# Patient Record
Sex: Male | Born: 1955 | Race: Black or African American | Hispanic: No | Marital: Single | Smoking: Former smoker
Health system: Southern US, Community
[De-identification: ages and names within clinical notes are randomized; demographics above are authoritative.]

## PROBLEM LIST (undated history)

## (undated) DIAGNOSIS — J45909 Unspecified asthma, uncomplicated: Secondary | ICD-10-CM

## (undated) DIAGNOSIS — Z950 Presence of cardiac pacemaker: Secondary | ICD-10-CM

## (undated) DIAGNOSIS — J449 Chronic obstructive pulmonary disease, unspecified: Secondary | ICD-10-CM

## (undated) DIAGNOSIS — I639 Cerebral infarction, unspecified: Secondary | ICD-10-CM

## (undated) DIAGNOSIS — M549 Dorsalgia, unspecified: Secondary | ICD-10-CM

## (undated) DIAGNOSIS — I10 Essential (primary) hypertension: Secondary | ICD-10-CM

## (undated) DIAGNOSIS — K219 Gastro-esophageal reflux disease without esophagitis: Secondary | ICD-10-CM

## (undated) DIAGNOSIS — R001 Bradycardia, unspecified: Secondary | ICD-10-CM

## (undated) DIAGNOSIS — K922 Gastrointestinal hemorrhage, unspecified: Secondary | ICD-10-CM

## (undated) DIAGNOSIS — N189 Chronic kidney disease, unspecified: Secondary | ICD-10-CM

## (undated) DIAGNOSIS — M519 Unspecified thoracic, thoracolumbar and lumbosacral intervertebral disc disorder: Secondary | ICD-10-CM

## (undated) DIAGNOSIS — I219 Acute myocardial infarction, unspecified: Secondary | ICD-10-CM

## (undated) DIAGNOSIS — G894 Chronic pain syndrome: Secondary | ICD-10-CM

## (undated) DIAGNOSIS — M1990 Unspecified osteoarthritis, unspecified site: Secondary | ICD-10-CM

## (undated) DIAGNOSIS — I495 Sick sinus syndrome: Secondary | ICD-10-CM

## (undated) DIAGNOSIS — J302 Other seasonal allergic rhinitis: Secondary | ICD-10-CM

## (undated) DIAGNOSIS — E785 Hyperlipidemia, unspecified: Secondary | ICD-10-CM

## (undated) HISTORY — DX: Bradycardia, unspecified: R00.1

## (undated) HISTORY — PX: BACK SURGERY: SHX140

## (undated) HISTORY — DX: Gastro-esophageal reflux disease without esophagitis: K21.9

## (undated) HISTORY — DX: Presence of cardiac pacemaker: Z95.0

## (undated) HISTORY — DX: Dorsalgia, unspecified: M54.9

## (undated) HISTORY — PX: PACEMAKER INSERTION: SHX728

## (undated) HISTORY — PX: LUMBAR LAMINECTOMY: SHX95

## (undated) HISTORY — DX: Acute myocardial infarction, unspecified: I21.9

## (undated) HISTORY — PX: HAND SURGERY: SHX662

## (undated) HISTORY — DX: Other seasonal allergic rhinitis: J30.2

## (undated) HISTORY — DX: Unspecified asthma, uncomplicated: J45.909

## (undated) HISTORY — DX: Unspecified osteoarthritis, unspecified site: M19.90

## (undated) HISTORY — DX: Chronic kidney disease, unspecified: N18.9

## (undated) HISTORY — PX: BUNIONECTOMY: SHX129

---

## 2004-06-10 DIAGNOSIS — M533 Sacrococcygeal disorders, not elsewhere classified: Secondary | ICD-10-CM | POA: Insufficient documentation

## 2004-06-10 DIAGNOSIS — M549 Dorsalgia, unspecified: Secondary | ICD-10-CM | POA: Insufficient documentation

## 2004-06-10 DIAGNOSIS — M129 Arthropathy, unspecified: Secondary | ICD-10-CM | POA: Insufficient documentation

## 2004-06-10 DIAGNOSIS — I10 Essential (primary) hypertension: Secondary | ICD-10-CM | POA: Insufficient documentation

## 2004-06-10 DIAGNOSIS — K2100 Gastro-esophageal reflux disease with esophagitis, without bleeding: Secondary | ICD-10-CM | POA: Insufficient documentation

## 2007-01-20 DIAGNOSIS — E785 Hyperlipidemia, unspecified: Secondary | ICD-10-CM | POA: Insufficient documentation

## 2007-10-08 DIAGNOSIS — F1410 Cocaine abuse, uncomplicated: Secondary | ICD-10-CM | POA: Insufficient documentation

## 2007-10-08 DIAGNOSIS — F528 Other sexual dysfunction not due to a substance or known physiological condition: Secondary | ICD-10-CM | POA: Insufficient documentation

## 2009-08-15 ENCOUNTER — Emergency Department: Admit: 2009-08-15 | Disposition: A | Discharge status: Home / Self Care | Payer: Self-pay | Source: Ambulatory Visit

## 2009-08-16 NOTE — ED Provider Notes (Signed)
VISIT NUMBER:  [id number]    DATE/TIME OF EVALUATION:  08/16/2009 at 0030    RESIDENT:  Dr. Joneen Roach.  I saw and evaluated the patient.  I agree with  the resident's/fellow's finding and plan of care as documented.  Details of  my evaluation are as follows:    HPI:  The patient is a 53 year old gentleman, who approximately 6 months  ago was fishing and got a dorsal spine of a fish in his finger.  He pulled  part of out but there was still a part in his finger.  Over the last  several days he has had increasing pain and swelling.  It does hurt to move  it particularly to flex the finger.  There is no numbness, weakness or  tingling.  No redness, swelling, purulent discharge of other places.  He  has been to 2 different emergency departments, who would no attempt to  remove the foreign body.    ALLERGIES:  Ibuprofen and all NSAIDs, as well as Dilaudid for which he a  rash.  He is able to take oral Percocet without any difficulty.    MEDICATIONS:     1. Hydrochlorothiazide.     2. Amlodipine.     3. Simvastatin.     4. Nexium.     5. Albuterol.    PAST MEDICAL / SURGICAL HISTORY:     1. Nocturnal bradycardia.     2. Hypertension.     3. Asthma.     4. Back surgery.     5. Left hand surgery.     6. He has a pacer.     7. Gastroesophageal reflux disease.     8. Hypercholesterolemia.     9. GI bleed.    FAMILY HISTORY:     1. Heart disease.     2. Hypertension.     3. Throat cancer.     4. Lung cancer.     5. Diabetes.    SOCIAL HISTORY:  He is a half-pack a day smoker for 40 years.  He does not  drink or take any recreational drugs.  He currently quit cocaine in 2004.  He works as a Naval architect.  He lives with significant other.  He denies  any domestic violence.    REVIEW OF SYSTEMS:  Cardiovascular:  Positive for heart disease and  hypertension.  Nocturnal bradycardia and pacemaker.  Pulmonary:  Positive  for asthma.  GI:  Positive for gastroesophageal reflux disease and GI  bleed.  Musculoskeletal/Skin:  Positive for  the foreign body in his finger,  as well as back surgery and left hand surgery.  All other systems reviewed  and were negative.    PHYSICAL EXAMINATION:  Temperature:  36.4.  Respiratory rate:  20.  Pulse:  80.  Blood pressure:  168/75.  Pulse oximetry:  98%.  Pain:  10.  General appearance:  The patient is awake, alert and oriented.  No obvious  distress.  Extremities:  His left long finger does have a palpable area of scar tissue  and very mild swelling.  There is no purulent drainage.  There is no  redness.  He has good profundus and superficialis use, as well as full  extension.  Neurovascular is intact.  Psychiatric:  Judgment, orientation, memory, mood and affect were all  intact.    MEDICAL DECISION MAKING/CONDITION:  Foreign body finger.        DDX:        PLAN:  DATA:        LABS:        RADIOLOGIC STUDIES:        ECG:        RHYTHM STRIPS:    PROCEDURE:     1. (I was present throughout the entire procedure):  Digital block  performed by Dr. Joneen Roach.  The finger was prepped with Chloraseptic and  approximately 4.5 to 4 cc of bupivacaine was injected into the base of the  finger with good results and resulting in anesthesia of the entire finger.       2. Foreign body removal performed by Dr. Joneen Roach and me.  I was there        for the entire procedure.  After cleaning the finger a #10 blade was        used in a transverse incision across the distal portion of the        foreign body.  Mosquito hemostats were used to dissect down through  the scar tissue until a clicking was felt. The foreign body was grasped and  the distal portion was removed.  The mosquito hemostats were reinserted and  the proximal portion was removed.  A total of approximately 2.5 cm of bone  was removed.  There is no other palpable foreign body.  Bleeding was well  controlled.  A dressing was placed.  CRITICAL CARE TIME (Time to perform separately billable procedures  subtracted from CC time):  CONSULT:  PCP NOTIFIED:  SMOKING  CESSATION COUNSELING:    FOLLOW-UP NOTE AND DISPOSITION:  The patient will be discharged with  Percocet and a dressing.  He will return if there are any problems.    ED DIAGNOSIS:   Fishbone foreign body in finger removed.                                        Electronically Signed and Finalized  by  Rochele Raring, MD 08/18/2009 14:13  ___________________________________________  Rochele Raring, MD      DD:   08/16/2009  DT:   08/16/2009  4:11 A  /[id number]  [id number]    cc:

## 2009-08-30 ENCOUNTER — Other Ambulatory Visit: Payer: Self-pay | Admitting: Gastroenterology

## 2009-08-30 ENCOUNTER — Observation Stay
Admit: 2009-08-30 | Disposition: A | Discharge status: Home / Self Care | Payer: Self-pay | Source: Ambulatory Visit | Attending: Emergency Medicine | Admitting: Emergency Medicine

## 2009-08-30 LAB — CBC AND DIFFERENTIAL
Baso # K/uL: 0 THOU/uL (ref 0.0–0.1)
Basophil %: 0.7 % (ref 0.2–1.2)
Eos # K/uL: 0.2 THOU/uL (ref 0.0–0.5)
Eosinophil %: 3.3 % (ref 0.8–7.0)
Hematocrit: 46 % (ref 40–51)
Hemoglobin: 15.9 g/dL (ref 13.7–17.5)
Lymph # K/uL: 1.8 THOU/uL (ref 1.3–3.6)
Lymphocyte %: 39.5 % (ref 21.8–53.1)
MCV: 88 fL (ref 79–92)
Mono # K/uL: 0.6 THOU/uL (ref 0.3–0.8)
Monocyte %: 13.7 % — ABNORMAL HIGH (ref 5.3–12.2)
Neut # K/uL: 1.9 THOU/uL (ref 1.8–5.4)
Platelets: 188 THOU/uL (ref 150–330)
RBC: 5.3 MIL/uL (ref 4.6–6.1)
RDW: 14.1 % (ref 11.6–14.4)
Seg Neut %: 42.8 % (ref 34.0–67.9)
WBC: 4.5 THOU/uL (ref 4.2–9.1)

## 2009-08-30 LAB — TROPONIN T
Troponin T: <0.01 ng/mL (ref 0.00–0.02)
Troponin T: <0.01 ng/mL (ref 0.00–0.02)

## 2009-08-30 LAB — CK ISOENZYMES
CK: 93 U/L (ref 46–171)
Mass CKMB: 2.7 ng/mL (ref 0.0–4.9)

## 2009-08-30 LAB — RUQ PANEL (ED ONLY)
ALT: 26 U/L (ref 0–50)
AST: 22 U/L (ref 0–50)
Albumin: 4.3 g/dL (ref 3.5–5.2)
Alk Phos: 107 U/L (ref 40–130)
Amylase: 39 U/L (ref 28–100)
Bilirubin,Direct: <0.2 mg/dL (ref 0.0–0.3)
Bilirubin,Total: 0.3 mg/dL (ref 0.0–1.2)
Lipase: 23 U/L (ref 13–60)
Total Protein: 6.5 g/dL (ref 6.3–7.7)

## 2009-08-30 LAB — PLASMA PROF 7 (ED ONLY)
Anion Gap,PL: 12 (ref 7–16)
CO2,Plasma: 22 mmol/L (ref 20–28)
Chloride,Plasma: 105 mmol/L (ref 96–108)
Creatinine: 1.45 mg/dL — ABNORMAL HIGH (ref 0.67–1.17)
GFR,Black: >59 *
GFR,Caucasian: 51 * — AB
Glucose,Plasma: 121 mg/dL — ABNORMAL HIGH (ref 74–106)
Potassium,Plasma: 3.6 mmol/L (ref 3.4–4.7)
Sodium,Plasma: 139 mmol/L (ref 132–146)
UN,Plasma: 15 mg/dL (ref 6–20)

## 2009-08-30 LAB — TYPE AND SCREEN
ABO RH Blood Type: O POS
Antibody Screen: NEGATIVE

## 2009-08-30 LAB — HOLD GRAY

## 2009-08-30 LAB — PROTIME-INR
INR: 0.8 — ABNORMAL LOW (ref 0.9–1.1)
Protime: 11.8 s — ABNORMAL LOW (ref 11.9–14.7)

## 2009-08-30 LAB — HEMOLYZED TEST FOR GREEN

## 2009-08-30 LAB — APTT: aPTT: 26.3 s (ref 22.3–35.3)

## 2009-08-30 NOTE — ED Provider Notes (Signed)
VISIT NUMBER:  [id number]    I saw and evaluated the patient.  I agree with the resident's/fellow's  finding and plan of care as documented.  Details of my evaluation are as  follows.    HPI:  This is a 54 year old gentleman with a history of pacemaker for sick  sinus syndrome who states that during the night his pacemaker fired twice  and he felt a shock in his left chest and pain in his left chest which went  into his left shoulder.  He does not have a defibrillator in, but he says  that he can feel when the pacemaker starts up.  He denies fever and chills.  No cough and no shortness of breath.  He states that he was admitted to  [facility] a month or two ago and had a stress test that was  negative.  That result is not available to [place].    ALLERGIES:  Alka Seltzer.  MEDICATIONS:  He has been noncompliant with his medications for the past  week because he cannot afford them.     1. Zocor.     2. Nexium.     3. Hydrochlorothiazide.     4. Norvasc.    PAST MEDICAL / SURGICAL HISTORY:  Hypertension, sick sinus syndrome,  pacemaker.    FAMILY HISTORY:  Family history of heart disease and CVA.    SOCIAL HISTORY:  He quit smoking today.  No alcohol or drug use.  He works  as a Naval architect.    REVIEW OF SYSTEMS:  General:  Blood pressure is high.  Cardiac:  Chest  pain.  All other systems reviewed and negative.    PHYSICAL EXAMINATION:  General:  On physical exam, he is awake and alert.  Eyes:  Conjunctivae pink.  HENT:  Normocephalic, atraumatic.  Heart:  Regular rate and rhythm. No murmur.  Pulmonary:  Lungs clear, no wheezes or rhonchi.  Abdomen:  Soft.  He has mild tenderness in his epigastric area.  No  guarding or rebound. Skin:  Normal color.  Extremities:  Well perfused atraumatic.  Neurologic:  Cranial nerves intact.  Motor and sensation intact.  Psychiatric:  Normal affect.    MEDICAL DECISION MAKING/CONDITION:  This is a patient with chest  discomfort, which seems unrelated to the pacemaker and I am  not sure if he  gets bradycardic and then feels palpitations as the pacemaker functions  appropriately or not.  We will get a CT of his chest, abdomen and pelvis to  rule out dissection and PE.  There were no abnormalities noted on any of  those studies and I think his likelihood for PE is low.  He has an ECG  which is a sinus rhythm, without ischemic changes. PLAN:  Our plan is to  admit him to observation unit for a second troponin.  EP was called to  check the functioning of his pacemaker and will be by to see him.      ED DIAGNOSIS:  Chest pain.                Electronically Signed and Finalized  by  Pauline Good, MD 09/01/2009 10:23  ___________________________________________  Pauline Good, MD      DD:   08/30/2009  DT:   08/30/2009  7:52 P  WUJ/[id number]#[id number]  [id number]    cc:

## 2009-08-31 LAB — CBC AND DIFFERENTIAL
Baso # K/uL: 0 THOU/uL (ref 0.0–0.1)
Basophil %: 0.2 % (ref 0.2–1.2)
Eos # K/uL: 0.2 THOU/uL (ref 0.0–0.5)
Eosinophil %: 4.5 % (ref 0.8–7.0)
Hematocrit: 44 % (ref 40–51)
Hemoglobin: 14.9 g/dL (ref 13.7–17.5)
Lymph # K/uL: 2.6 THOU/uL (ref 1.3–3.6)
Lymphocyte %: 49.4 % (ref 21.8–53.1)
MCV: 89 fL (ref 79–92)
Mono # K/uL: 0.5 THOU/uL (ref 0.3–0.8)
Monocyte %: 9.3 % (ref 5.3–12.2)
Neut # K/uL: 1.9 THOU/uL (ref 1.8–5.4)
Platelets: 165 THOU/uL (ref 150–330)
RBC: 4.9 MIL/uL (ref 4.6–6.1)
RDW: 14.1 % (ref 11.6–14.4)
Seg Neut %: 36.6 % (ref 34.0–67.9)
WBC: 5.2 THOU/uL (ref 4.2–9.1)

## 2009-08-31 LAB — HEPATIC FUNCTION PANEL
ALT: 25 U/L (ref 0–50)
AST: 25 U/L (ref 0–50)
Albumin: 4.1 g/dL (ref 3.5–5.2)
Alk Phos: 100 U/L (ref 40–130)
Bilirubin,Direct: <0.2 mg/dL (ref 0.0–0.3)
Bilirubin,Total: 0.2 mg/dL (ref 0.0–1.2)
Total Protein: 6.1 g/dL — ABNORMAL LOW (ref 6.3–7.7)

## 2009-08-31 LAB — AMYLASE: Amylase: 42 U/L (ref 28–100)

## 2009-08-31 LAB — BASIC METABOLIC PANEL
Anion Gap: 7 (ref 7–16)
CO2: 28 mmol/L (ref 20–28)
Calcium: 9 mg/dL (ref 8.6–10.2)
Chloride: 104 mmol/L (ref 96–108)
Creatinine: 1.66 mg/dL — ABNORMAL HIGH (ref 0.67–1.17)
GFR,Black: 53 * — AB
GFR,Caucasian: 44 * — AB
Glucose: 102 mg/dL (ref 74–106)
Lab: 17 mg/dL (ref 6–20)
Potassium: 4.4 mmol/L (ref 3.3–5.1)
Sodium: 139 mmol/L (ref 133–145)

## 2009-08-31 LAB — PHOSPHORUS: Phosphorus: 4 mg/dL (ref 2.7–4.5)

## 2009-08-31 LAB — LIPASE: Lipase: 19 U/L (ref 13–60)

## 2009-08-31 LAB — TROPONIN T: Troponin T: <0.01 ng/mL (ref 0.00–0.02)

## 2009-08-31 LAB — MAGNESIUM: Magnesium: 1.7 meq/L (ref 1.3–2.1)

## 2009-08-31 NOTE — ED Provider Notes (Signed)
FOLLOW-UP NOTE    VISIT NUMBER:  [id number]    DISCHARGE DATE FROM ED OBSERVATION:  08/31/2009.    PCP:  Shary Decamp, MD; however, the patient is telling me that he has an  appointment with a new doctor at [facility], but he does not know  the name of the doctor who he is going to see there.  The appointment is  set for January 17.    OBSERVATION STAY COURSE INCLUDES:  A 54 year old male with history of  chronic back pain on narcotics.  Evaluated in ED for multiple complaints  that include chest pain, abdominal pain, pain radiating to his shoulders.  He was subsequently placed in ED Obs, maintained on telemetry that did not  show any acute arrhythmia.  He was also ruled out with serial troponins.  All his troponins were less than 0.01.    Patient states that he had a recent negative stress test at [facility].    This morning, patient stated that his pain is in the abdominal area;  however, he is able to tolerate p.o. and has eaten a full breakfast.  Per  staff, patient has been asking for pain medications.  Patient states that  he has chronic back pain, and he is on narcotics and also on methadone.    PHYSICAL EXAM:  On exam, he looks comfortable in no acute distress.  Vital  signs are stable.  Anicteric sclerae.  Neck has no lymph nodes, no JVD.  Chest is clear to auscultation.  CVS is regular rate and rhythm.  Abdomen  is soft.  There is no area of localized tenderness.  Bowel sounds are plus.  Extremities without any edema.  CNS nonfocal.  Skin reveals some eczematous  patches.    DATA:        LABS:  CBC and SMA-7:  White count 5.2, hematocrit 44, platelets 165,  sodium 139, potassium 4.4, bicarb 28, BUN 17, creatinine mildly elevated at  1.66.  He has creatinine previously 1.40, 1.60, and 1.50.  Glucose is 102.  LFTs are within normal limits.  Amylase and lipase are normal.        RADIOLOGIC STUDIES:  Patient had a CT of his chest and also had a CT  of abdomen and pelvis.  Final impression  is:  1) No acute dissection.  2)  No evidence of any intraperitoneal abscess, diverticulitis, or inflammatory  changes of the bowel.  3) Normal appendix.  4) Multiple renal cysts some of  which are complex and may need follow-up with ultrasound.  Kidneys and  collecting system:  No hydronephrosis.  Nonspecific perinephric stranding.       Multiple renal cysts some of which are complex, for example on the right  in the mid-kidney.  Follow-up can be obtained with ultrasound.    ECG:  NSR, No acute changes    .      SMOKING CESSATION COUNSELING:  Three-plus minutes smoking cessation  counseling done.  Risks of smoking reviewed including possible lung disease  (COPD, cancer) and cardiac disease.  Options of cessation aids reviewed  including nicotine patch, hypnosis, and possible prescription medications.  Patient encouraged to follow up with outpatient M.D.    FINAL DIAGNOSIS:  Chest pain, abdominal pain, narcotic dependence, chronic  back pain.    Discharge instructions given through ED PulseCheck.  Patient is advised to  establish outpatient care and also with outpatient methadone maintenance  program.    FOLLOW  UP WITH:  PCP    MEDICATION CHANGES:  None.    PRESCRIPTIONS GIVEN:  Prescriptions have been given for Percocet 5/325  total number 20 and also for his methadone 10 mg total number 30.                                        Electronically Signed and Finalized  by  Nicholaus Bloom, MD 08/31/2009 20:10  ___________________________________________  Nicholaus Bloom, MD      DD:   08/31/2009  DT:   08/31/2009 11:59 A  [id number]  [id number]    cc:   Percell Locus, MD

## 2009-09-03 LAB — EKG 12-LEAD
P: 32 degrees
P: 41 degrees
P: 45 degrees
PR: 168 ms
PR: 164 ms
PR: 172 ms
QRS: 51 degrees
QRS: 61 degrees
QRS: 64 degrees
QRSD: 92 ms
QRSD: 94 ms
QRSD: 96 ms
QT: 396 ms
QT: 440 ms
QT: 428 ms
QTc: 449 ms
QTc: 465 ms
QTc: 421 ms
Rate: 77 {beats}/min
Rate: 67 {beats}/min
Rate: 58 {beats}/min
Severity: BORDERLINE
Severity: BORDERLINE
Severity: BORDERLINE
T: 47 degrees
T: 74 degrees
T: 77 degrees

## 2010-03-30 ENCOUNTER — Encounter: Payer: Self-pay | Admitting: Physician Assistant

## 2010-03-30 ENCOUNTER — Emergency Department
Admit: 2010-03-30 | Disposition: A | Discharge status: Home / Self Care | Payer: Self-pay | Source: Ambulatory Visit | Attending: Emergency Medicine | Admitting: Emergency Medicine

## 2010-03-30 HISTORY — DX: Chronic obstructive pulmonary disease, unspecified: J44.9

## 2010-03-30 HISTORY — DX: Essential (primary) hypertension: I10

## 2010-03-30 HISTORY — DX: Unspecified asthma, uncomplicated: J45.909

## 2010-03-30 HISTORY — DX: Gastrointestinal hemorrhage, unspecified: K92.2

## 2010-03-30 LAB — POCT URINALYSIS DIPSTICK
Glucose,UA: NORMAL
Ketones, UA: NEGATIVE
Leuk Esterase,UA: NEGATIVE
Lot #: 20402002
Nitrite,UA POCT: NEGATIVE
PH,Ur: 5

## 2010-03-30 MED ORDER — OXYCODONE-ACETAMINOPHEN 5-325 MG PO TABS *I*
1.0000 | ORAL_TABLET | ORAL | Status: AC | PRN
Start: 2010-03-30 — End: 2010-04-09

## 2010-03-30 MED ORDER — METHADONE HCL 10 MG PO TABS *I*
10.0000 mg | ORAL_TABLET | Freq: Four times a day (QID) | ORAL | Status: AC | PRN
Start: 2010-03-30 — End: 2010-04-09

## 2010-03-30 NOTE — Discharge Instructions (Signed)
Keep your appointment on 8/10 with your new doctor to discuss your chronic pain and blood in your urine.    They will review all your records from [facility] and [facility] to decide on future testing.

## 2010-03-30 NOTE — ED Notes (Signed)
Awake in bed, skin warm and dry, no acute distress noted. Requesting pain medication, T. Whilen PA notified, awaiting orders.

## 2010-03-30 NOTE — ED Notes (Signed)
States that he went to the ED three times - has had pain from his abdomen that radiates down into his groin, has been told that it is the way that he wears his belt - also expresses that his penis itches on the inside - started about a week ago with the abdominal pain - itching started yesterday, denies discharge and burning - denies any N/V

## 2010-03-30 NOTE — ED Provider Notes (Addendum)
History   Chief Complaint   Patient presents with   . Abdominal Pain   . Groin Pain   . Penis Pain       HPI Comments: 54 yr old presenting with vague abdominal pain radiating to groin area for greater than 6 months and currently out of his prescriptions for his percocet and Methadone.  No new symptoms and has been seen 4 times but told nothing of his test.  Pt. Has not had a MD in months- Dr. Otilio Miu family med left area and Dr. Derinda Late didn't get alone.  Notes in [facility] multiple documentation of chronic pain and narcotic seeking behavior.  Pt has an appointment with new MD on 04/08/10 but unsure where the MD is located.      Patient is a 54 y.o. male presenting with abdominal pain. The history is provided by the patient and a friend.   Abdominal Pain  The primary symptoms of the illness include abdominal pain. The primary symptoms of the illness do not include fever, fatigue, shortness of breath, nausea, vomiting, diarrhea, hematemesis, hematochezia or dysuria. Episode onset: greater than 6 months. The onset of the illness was gradual. The problem has not changed since onset.   The patient has not had a change in bowel habit. Additional symptoms associated with the illness include heartburn and back pain. Symptoms associated with the illness do not include chills, anorexia, diaphoresis, constipation, urgency, hematuria or frequency. Significant associated medical issues include PUD and GERD. Significant associated medical issues do not include inflammatory bowel disease, diabetes, sickle cell disease, gallstones, liver disease, substance abuse or diverticulitis.       Past Medical History   Diagnosis Date   . Hypertension    . COPD (chronic obstructive pulmonary disease)    . Asthma    . GI bleed          Past Surgical History   Procedure Date   . Back surgery      5 times since 1999   . Pacemaker insertion          Family History   Problem Relation Age of Onset   . Coronary art dis Father           reports that he has  been smoking cigarettes.  He has a 10 pack-year smoking history. He has never used smokeless tobacco.  He reports that he does not currently drink alcohol or use illicit drugs.    Review of Systems   Review of Systems   Constitutional: Negative for fever, chills, diaphoresis, activity change, appetite change and fatigue.   HENT: Negative for dental problem.    Eyes: Negative for pain.   Respiratory: Negative for cough, choking, chest tightness, shortness of breath and wheezing.    Cardiovascular: Negative for chest pain and palpitations.   Gastrointestinal: Positive for heartburn and abdominal pain. Negative for nausea, vomiting, diarrhea, constipation, blood in stool, hematochezia, abdominal distention, anal bleeding, anorexia and hematemesis.   Genitourinary: Positive for testicular pain. Negative for dysuria, urgency, frequency, hematuria, flank pain, decreased urine volume, discharge, penile swelling, scrotal swelling, difficulty urinating, genital sores and penile pain.        Dry rash to penis- previous MD used a cream with good effect.  >6 months abdominal to left scrotal pain.   Musculoskeletal: Positive for back pain. Negative for myalgias and joint swelling.   Skin: Negative for color change and pallor.   Neurological: Negative for dizziness and headaches.   Hematological: Negative for  adenopathy.   Psychiatric/Behavioral: Negative for agitation.       Physical Exam   BP 146/105  Pulse 81  Temp(Src) 36.6 C (97.9 F) (Tympanic)  Resp 20  Ht 1.854 m (6\' 1" )  Wt 147.873 kg (326 lb)  BMI 43.01 kg/m2  SpO2 95%    Physical Exam   Constitutional: He is oriented to person, place, and time. He appears well-developed and well-nourished. No distress.   HENT:   Head: Normocephalic.   Eyes: Conjunctivae are normal. Left eye exhibits no discharge. No scleral icterus.   Neck: Normal range of motion.   Cardiovascular: Regular rhythm.  Exam reveals no gallop and no friction rub.    No murmur  heard.  Pulmonary/Chest: Effort normal and breath sounds normal. No stridor.   Abdominal: Soft. Bowel sounds are normal. He exhibits no distension and no mass. There is no hepatosplenomegaly. Tenderness is present in the left lower quadrant. He has no rigidity, no rebound, no guarding, no CVA tenderness, no pain at McBurney's point and no Murphy's sign. No hernia. Hernia confirmed negative in the ventral area, confirmed negative in the right inguinal area and confirmed negative in the left inguinal area.               Soft, Non distended.  No hepatosplenomegaly. No tenderness with distraction (stethoscope) but tender to light touch.  Sits up and rolls over without any signs of distress.     Genitourinary: Rectum normal, prostate normal, testes normal and penis normal. Guaiac negative stool.        Right testis shows no mass, no swelling and no tenderness. Left testis shows no mass, no swelling and no tenderness. Circumcised. No phimosis, hypospadias, penile erythema or penile tenderness. No discharge found.        Dryness with some lichenification to dorsum of penis.  No ulcer or erythema.   Musculoskeletal: Normal range of motion. He exhibits no edema and no tenderness.   Lymphadenopathy:        Right: No inguinal adenopathy present.        Left: No inguinal adenopathy present.   Neurological: He is alert and oriented to person, place, and time.   Skin: Skin is warm and dry.   Psychiatric: He has a normal mood and affect. His behavior is normal.       Medical Decision Making   MDM  Number of Diagnoses or Management Options  Diagnosis management comments: Patient seen by me today, 03/30/2010 at 6:10PM    Assessment:  54 y.o., male comes to the ED with chronic pain and vague abdominal pain in setting of need of prescription refill.  Differential Diagnosis includes drug seeking behavior, nephrolithiasis, UTI, diverticulitis  Plan: UA trace heme.  Chronic condition which can be evaluated out pt setting.  Has had  extensive w/u at [facility] this year for similar complaints and no current signs of distress of fever.  Will refill scripts for Percocet and Methadone for 10 days but if fails to keep appointment will not refill again in emergency department.  Patient can use over the counter hydrocortisone cream for his dry area on penis.         Amount and/or Complexity of Data Reviewed  Clinical lab tests: ordered  Decide to obtain previous medical records or to obtain history from someone other than the patient: yes  Obtain history from someone other than the patient: yes  Review and summarize past medical records: yes  Patient agrees to plan of care and follow up with MD.  Cyndie Mull, PA

## 2012-05-09 ENCOUNTER — Encounter (HOSPITAL_COMMUNITY): Payer: Self-pay | Admitting: Family Medicine

## 2012-05-09 ENCOUNTER — Emergency Department (HOSPITAL_COMMUNITY)
Admission: EM | Admit: 2012-05-09 | Discharge: 2012-05-09 | Disposition: A | Discharge status: Home / Self Care | Payer: Medicare Other | Attending: Emergency Medicine | Admitting: Emergency Medicine

## 2012-05-09 DIAGNOSIS — Z950 Presence of cardiac pacemaker: Secondary | ICD-10-CM | POA: Insufficient documentation

## 2012-05-09 DIAGNOSIS — Z760 Encounter for issue of repeat prescription: Secondary | ICD-10-CM | POA: Insufficient documentation

## 2012-05-09 DIAGNOSIS — F17200 Nicotine dependence, unspecified, uncomplicated: Secondary | ICD-10-CM | POA: Insufficient documentation

## 2012-05-09 DIAGNOSIS — I10 Essential (primary) hypertension: Secondary | ICD-10-CM | POA: Insufficient documentation

## 2012-05-09 DIAGNOSIS — Z8673 Personal history of transient ischemic attack (TIA), and cerebral infarction without residual deficits: Secondary | ICD-10-CM | POA: Insufficient documentation

## 2012-05-09 HISTORY — DX: Cerebral infarction, unspecified: I63.9

## 2012-05-09 HISTORY — DX: Essential (primary) hypertension: I10

## 2012-05-09 MED ORDER — LISINOPRIL-HYDROCHLOROTHIAZIDE 10-12.5 MG PO TABS: 1.0000 | ORAL_TABLET | Freq: Every day | ORAL | Status: DC

## 2012-05-09 MED ORDER — AMLODIPINE BESYLATE 10 MG PO TABS: 5.0000 mg | ORAL_TABLET | Freq: Every day | ORAL | Status: DC

## 2012-05-09 MED ORDER — OXYCODONE-ACETAMINOPHEN 5-325 MG PO TABS: 1.0000 | ORAL_TABLET | ORAL | Status: AC | PRN

## 2012-05-09 MED ORDER — ESOMEPRAZOLE MAGNESIUM 40 MG PO CPDR: 40.0000 mg | DELAYED_RELEASE_CAPSULE | Freq: Every day | ORAL | Status: DC

## 2012-05-09 NOTE — ED Provider Notes (Signed)
History     CSN: [id number]  Arrival date & time 05/09/12  1907   First MD Initiated Contact with Patient 05/09/12 2028      Chief Complaint  Patient presents with  . Medication Refill    (Consider location/radiation/quality/duration/timing/severity/associated sxs/prior treatment) HPI History provided by pt.   [place].  Ran out of his blood pressure medications as well as nexium and percocet/methadone (taken for chronic low back pain) 4 days ago.  He denies headache, vision changes, CP, SOB.  No recent acid reflux.  No change in low back pain.    Past Medical History  Diagnosis Date  . Hypertension   . Stroke     Past Surgical History  Procedure Date  . Pacemaker insertion     History reviewed. No pertinent family history.  History  Substance Use Topics  . Smoking status: Current Everyday Smoker  . Smokeless tobacco: Not on file  . Alcohol Use: No      Review of Systems  All other systems reviewed and are negative.    Allergies  Review of patient's allergies indicates no known allergies.  Home Medications   Current Outpatient Rx  Name Route Sig Dispense Refill  . AMLODIPINE BESYLATE 2.5 MG PO TABS Oral Take 5 mg by mouth daily.    Marland Kitchen ESOMEPRAZOLE MAGNESIUM 20 MG PO CPDR Oral Take 20 mg by mouth daily before breakfast.    . METHADONE HCL 10 MG PO TABS Oral Take 10 mg by mouth every 8 (eight) hours. For pain    . OXYCODONE-ACETAMINOPHEN 5-325 MG PO TABS Oral Take 1 tablet by mouth every 4 (four) hours as needed. For pa in    . SIMVASTATIN 20 MG PO TABS Oral Take 20 mg by mouth every evening.    Marland Kitchen AMLODIPINE BESYLATE 10 MG PO TABS Oral Take 0.5 tablets (5 mg total) by mouth daily. 30 tablet 0  . ESOMEPRAZOLE MAGNESIUM 40 MG PO CPDR Oral Take 1 capsule (40 mg total) by mouth daily. 30 capsule 0  . LISINOPRIL-HYDROCHLOROTHIAZIDE 10-12.5 MG PO TABS Oral Take 1 tablet by mouth daily. 30 tablet 0  . OXYCODONE-ACETAMINOPHEN 5-325 MG PO TABS  Oral Take 1 tablet by mouth every 4 (four) hours as needed for pain. 20 tablet 0    BP 166/111  Pulse 79  Temp 98.2 F (36.8 C) (Oral)  Resp 16  SpO2 96%  Physical Exam  Nursing note and vitals reviewed. Constitutional: He is oriented to person, place, and time. He appears well-developed and well-nourished. No distress.  HENT:  Head: Normocephalic and atraumatic.  Eyes:       Normal appearance  Neck: Normal range of motion.  Cardiovascular: Normal rate and regular rhythm.   Pulmonary/Chest: Effort normal and breath sounds normal. No respiratory distress.  Musculoskeletal: Normal range of motion.       Multiple vertical surgical scars to lumbar spine.  Full active ROM all four extremities.  Ambulates w/out difficulty.   Neurological: He is alert and oriented to person, place, and time.  Skin: Skin is warm and dry. No rash noted.  Psychiatric: He has a normal mood and affect. His behavior is normal.    ED Course  Procedures (including critical care time)  Labs Reviewed - No data to display No results found.   1. Medication refill       MDM  Pt presents for medication refill.  Visiting from out of town.  Out of amlodipine, lisinopril,  nexium and percocet/methadone (taken for chronic low back pain) for 4 days.  Pt asymptomatic w/ exception of chronic/unchanged low back pain.  BP elevated at 166/111 but exam otherwise unremarkable.  Pt seems very reasonable.  D/c'd home w/ refill of percocet, nexium and BP medications.  Return precautions discussed.         Dennis Brock [place], [place] 05/09/12 2221

## 2012-05-09 NOTE — ED Notes (Signed)
Pt is out of high blood pressure medication. sts x 4 days. Pt here from [place]. Sts takes lisinopril and Amlodipine.

## 2012-05-09 NOTE — ED Notes (Signed)
Prescriptions x4 given with discharge instructions.

## 2012-05-09 NOTE — ED Notes (Signed)
Pt. Reports just moved here from [place]. States had a stroke, has a pacemaker, and hx of back surgery and has run out of medications.

## 2012-05-10 NOTE — ED Provider Notes (Signed)
Medical screening examination/treatment/procedure(s) were performed by non-physician practitioner and as supervising physician I was immediately available for consultation/collaboration.   Ollen Rao, MD 05/10/12 2356 

## 2012-05-31 ENCOUNTER — Emergency Department (HOSPITAL_COMMUNITY): Payer: Medicare Other

## 2012-05-31 ENCOUNTER — Encounter (HOSPITAL_COMMUNITY): Payer: Self-pay | Admitting: *Deleted

## 2012-05-31 ENCOUNTER — Inpatient Hospital Stay (HOSPITAL_COMMUNITY)
Admission: EM | Admit: 2012-05-31 | Discharge: 2012-06-02 | DRG: 065 | Disposition: A | Discharge status: Home / Self Care | Payer: Medicare Other | Attending: Family Medicine | Admitting: Family Medicine

## 2012-05-31 DIAGNOSIS — I509 Heart failure, unspecified: Secondary | ICD-10-CM | POA: Diagnosis not present

## 2012-05-31 DIAGNOSIS — I6990 Unspecified sequelae of unspecified cerebrovascular disease: Secondary | ICD-10-CM

## 2012-05-31 DIAGNOSIS — G8190 Hemiplegia, unspecified affecting unspecified side: Secondary | ICD-10-CM | POA: Diagnosis present

## 2012-05-31 DIAGNOSIS — I639 Cerebral infarction, unspecified: Secondary | ICD-10-CM

## 2012-05-31 DIAGNOSIS — I10 Essential (primary) hypertension: Secondary | ICD-10-CM | POA: Diagnosis present

## 2012-05-31 DIAGNOSIS — G894 Chronic pain syndrome: Secondary | ICD-10-CM | POA: Diagnosis present

## 2012-05-31 DIAGNOSIS — K219 Gastro-esophageal reflux disease without esophagitis: Secondary | ICD-10-CM | POA: Insufficient documentation

## 2012-05-31 DIAGNOSIS — Z720 Tobacco use: Secondary | ICD-10-CM | POA: Diagnosis present

## 2012-05-31 DIAGNOSIS — I517 Cardiomegaly: Secondary | ICD-10-CM

## 2012-05-31 DIAGNOSIS — I671 Cerebral aneurysm, nonruptured: Secondary | ICD-10-CM | POA: Diagnosis present

## 2012-05-31 DIAGNOSIS — Z79899 Other long term (current) drug therapy: Secondary | ICD-10-CM

## 2012-05-31 DIAGNOSIS — G8929 Other chronic pain: Secondary | ICD-10-CM | POA: Diagnosis present

## 2012-05-31 DIAGNOSIS — I5030 Unspecified diastolic (congestive) heart failure: Secondary | ICD-10-CM | POA: Diagnosis not present

## 2012-05-31 DIAGNOSIS — I119 Hypertensive heart disease without heart failure: Secondary | ICD-10-CM | POA: Diagnosis present

## 2012-05-31 DIAGNOSIS — Z23 Encounter for immunization: Secondary | ICD-10-CM

## 2012-05-31 DIAGNOSIS — I495 Sick sinus syndrome: Secondary | ICD-10-CM | POA: Diagnosis present

## 2012-05-31 DIAGNOSIS — Z8673 Personal history of transient ischemic attack (TIA), and cerebral infarction without residual deficits: Secondary | ICD-10-CM | POA: Diagnosis present

## 2012-05-31 DIAGNOSIS — I6789 Other cerebrovascular disease: Secondary | ICD-10-CM | POA: Diagnosis present

## 2012-05-31 DIAGNOSIS — I6350 Cerebral infarction due to unspecified occlusion or stenosis of unspecified cerebral artery: Principal | ICD-10-CM | POA: Diagnosis present

## 2012-05-31 DIAGNOSIS — F17200 Nicotine dependence, unspecified, uncomplicated: Secondary | ICD-10-CM | POA: Diagnosis present

## 2012-05-31 DIAGNOSIS — E785 Hyperlipidemia, unspecified: Secondary | ICD-10-CM | POA: Diagnosis present

## 2012-05-31 DIAGNOSIS — Z950 Presence of cardiac pacemaker: Secondary | ICD-10-CM

## 2012-05-31 HISTORY — DX: Hyperlipidemia, unspecified: E78.5

## 2012-05-31 HISTORY — DX: Chronic pain syndrome: G89.4

## 2012-05-31 HISTORY — DX: Sick sinus syndrome: I49.5

## 2012-05-31 HISTORY — DX: Gastro-esophageal reflux disease without esophagitis: K21.9

## 2012-05-31 LAB — CBC
HCT: 43.3 % (ref 39.0–52.0)
MCV: 85.8 fL (ref 78.0–100.0)
Platelets: 189 10*3/uL (ref 150–400)
Platelets: 184 10*3/uL (ref 150–400)
RBC: 5.01 MIL/uL (ref 4.22–5.81)
RDW: 13.6 % (ref 11.5–15.5)
RDW: 13.8 % (ref 11.5–15.5)
WBC: 5.1 10*3/uL (ref 4.0–10.5)
WBC: 5.2 10*3/uL (ref 4.0–10.5)

## 2012-05-31 LAB — PROTIME-INR: INR: 0.86 (ref 0.00–1.49)

## 2012-05-31 LAB — BASIC METABOLIC PANEL
Chloride: 100 mEq/L (ref 96–112)
Creatinine, Ser: 1.28 mg/dL (ref 0.50–1.35)
GFR calc Af Amer: 71 mL/min — ABNORMAL LOW (ref >90)
Sodium: 137 mEq/L (ref 135–145)

## 2012-05-31 LAB — CREATININE, SERUM
GFR calc Af Amer: 70 mL/min — ABNORMAL LOW (ref >90)
GFR calc non Af Amer: 60 mL/min — ABNORMAL LOW (ref >90)

## 2012-05-31 LAB — TROPONIN I: Troponin I: <0.3 ng/mL (ref <0.30)

## 2012-05-31 MED ORDER — IOHEXOL 350 MG/ML SOLN
75.0000 mL | Freq: Once | INTRAVENOUS | Status: AC | PRN
  Administered 2012-05-31: 75 mL via INTRAVENOUS

## 2012-05-31 MED ORDER — FAMOTIDINE 20 MG PO TABS
20.0000 mg | ORAL_TABLET | Freq: Two times a day (BID) | ORAL | Status: DC
  Filled 2012-05-31: qty 1

## 2012-05-31 MED ORDER — MORPHINE SULFATE 2 MG/ML IJ SOLN: 2.0000 mg | INTRAMUSCULAR | Status: DC | PRN

## 2012-05-31 MED ORDER — INFLUENZA VIRUS VACC SPLIT PF IM SUSP
0.5000 mL | INTRAMUSCULAR | Status: AC
Start: 2012-06-01 — End: 2012-06-02
  Filled 2012-05-31: qty 0.5

## 2012-05-31 MED ORDER — ASPIRIN 81 MG PO CHEW
324.0000 mg | CHEWABLE_TABLET | Freq: Once | ORAL | Status: AC
  Administered 2012-05-31: 324 mg via ORAL
  Filled 2012-05-31: qty 4

## 2012-05-31 MED ORDER — HEPARIN SODIUM (PORCINE) 5000 UNIT/ML IJ SOLN
5000.0000 [IU] | Freq: Three times a day (TID) | INTRAMUSCULAR | Status: DC
  Administered 2012-05-31: 5000 [IU] via SUBCUTANEOUS
  Filled 2012-05-31 (×2): qty 1

## 2012-05-31 MED ORDER — GABAPENTIN 300 MG PO CAPS
300.0000 mg | ORAL_CAPSULE | Freq: Four times a day (QID) | ORAL | Status: DC | PRN
  Administered 2012-05-31: 300 mg via ORAL
  Filled 2012-05-31 (×2): qty 1

## 2012-05-31 MED ORDER — PNEUMOCOCCAL VAC POLYVALENT 25 MCG/0.5ML IJ INJ
0.5000 mL | INJECTION | INTRAMUSCULAR | Status: AC
  Filled 2012-05-31: qty 0.5

## 2012-05-31 MED ORDER — SIMVASTATIN 20 MG PO TABS
20.0000 mg | ORAL_TABLET | Freq: Every evening | ORAL | Status: DC
  Administered 2012-06-01: 20 mg via ORAL
  Filled 2012-05-31 (×2): qty 1

## 2012-05-31 MED ORDER — NICOTINE 7 MG/24HR TD PT24
7.0000 mg | MEDICATED_PATCH | Freq: Every day | TRANSDERMAL | Status: DC
  Administered 2012-06-01: 7 mg via TRANSDERMAL
  Filled 2012-05-31: qty 1

## 2012-05-31 MED ORDER — PANTOPRAZOLE SODIUM 40 MG PO TBEC
40.0000 mg | DELAYED_RELEASE_TABLET | Freq: Every day | ORAL | Status: DC
  Administered 2012-05-31 – 2012-06-02 (×3): 40 mg via ORAL
  Filled 2012-05-31 (×3): qty 1

## 2012-05-31 MED ORDER — ONDANSETRON HCL 4 MG/2ML IJ SOLN: 4.0000 mg | Freq: Four times a day (QID) | INTRAMUSCULAR | Status: DC | PRN

## 2012-05-31 MED ORDER — MORPHINE SULFATE 2 MG/ML IJ SOLN
1.0000 mg | INTRAMUSCULAR | Status: DC | PRN
  Administered 2012-06-01 (×3): 1 mg via INTRAVENOUS
  Filled 2012-05-31 (×3): qty 1

## 2012-05-31 MED ORDER — MORPHINE SULFATE 2 MG/ML IJ SOLN
1.0000 mg | INTRAMUSCULAR | Status: DC | PRN
  Administered 2012-05-31 (×2): 1 mg via INTRAVENOUS
  Filled 2012-05-31: qty 1

## 2012-05-31 MED ORDER — CLOPIDOGREL BISULFATE 75 MG PO TABS
75.0000 mg | ORAL_TABLET | Freq: Every day | ORAL | Status: DC
  Administered 2012-06-01 – 2012-06-02 (×2): 75 mg via ORAL
  Filled 2012-05-31 (×2): qty 1

## 2012-05-31 MED ORDER — SENNOSIDES-DOCUSATE SODIUM 8.6-50 MG PO TABS
1.0000 | ORAL_TABLET | Freq: Every evening | ORAL | Status: DC | PRN
  Filled 2012-05-31: qty 1

## 2012-05-31 NOTE — H&P (Signed)
Family Medicine Teaching [facility] Admission History and Physical Service Pager: [phone]  Patient name: Dennis Brock [facility] Medical record number: [id number] Date of birth: 20-Apr-1956 Age: 56 y.o. Gender: male  Primary Care Provider: Unassigned  Chief Complaint: Left sided weakness  Assessment and Plan:  Dennis Brock is a 56 y.o. year old male  with a history tobacco abuse, hypertension, hyperlipidemia, and of stroke on Aspirin presenting with worsening left sided weakness likely due to recurrent stroke  1. Acute Stroke-aspirin failure, will advance to plavix. Cannot do MRI due to pacemaker but assume given presentation.  1. Will await further neuro recs 1. Specifically if any recommendations for possible developing aneurysm.  2. Risk stratify with a1c and fasting lipid 3. Risk factor modification-will monitor #1 as well as have stressed that patient MUST quit smoking 4. Echocardiogram, carotid dopplers ordered 5. Neuro floor with neuro checks 6. PT/OT/speech consulted 7. Treat headache with morphine for now and transition to PO in AM 8. Hold home antihypertensives for 24 hours to allow permissive hypertension then slowly reintroduce if patient in fact taking 2. Chest Pain-monitor overnight on telemetry. Wells criteria 0. TIMI risk score of 2. Greatly improved and reportedly gone when seen by neurology. Will monitor overnight. Noncardiac in nature (1/3 as relieved by rest, but not provoked by exertion or anginal in quality). Cycle cardiac enzymes and repeat AM EKG. TSH pending in addition to stroke risk stratification. Will likely need outpatient stress test but will defer unless chest pain continues in AM. Possible GI in nature given GERD-continue PPI and consider GI cocktail.  3. Sick Sinus syndrome-pacemaker in place, monitor on telemetry 4. Chronic Pain-reportedly on methadone and percocet in [place]. Will treat with morphine acutely for HA but will likely need to transition to PO  pain meds together. Do not intend to chronically prescribe for patient, likely will need pain medicine clinic.  5. HTN-hold meds per #1 6. HLD-continue zocor, check lipids 7. Tobacco abuse-continue to stress importance of quitting. Nicotine patch.  8. FEN/GI: Heart healthy diet as passed bedside swallow screen 9. Prophylaxis: SCDs 10. Disposition: pending further workup  11. Code Status: Full Code  History of Present Illness: Dennis Brock is a 56 y.o. year old male presenting with a history of stroke 2 months ago on Aspirin presenting with worsening left sided weakness.   Patient got up to use bathroom 2 am last night and noted head hurt very bad behind right ear and over left eye. He felt in his normal state of health when he went to bed at 10 or 11.  Patient got dizzy, had double vision during this time.Also noted worsening left leg and arm weakness as well as worsening numbness in left arm. Patient states at baseline has weakness on left side including the fact that he drags that foot at times when walking. Patient's had hurt so bad couldn't sleep. Head started hurting worse in late morning so he came to hospital at around 1pm. Nausea and vomiting when pain got bad up to 10/10 felt like beating with stick. ROS also shows sensitivity to light in left eye. Pain is currently just in front of left ear. Patient states symptoms eerily similar to his stroke 2 months ago. Patient very adamant that he was on aspirin before going to hospital but that he was sent home on aspirin.   Chest pain also at beginning of this episode after numbness started in arm. Stabbing pain on left side 8/10 radiating to shoulder with  gradual onset over time. Cold sweats on multiple occaisons. Fluttering feeling in heart. Slight SOB when walking around but none at rest. Main symptom was no energy.   Got morphine and HA improved to 4/10. Chest pain improved to 2/10. CT confirms history of stroke with remote infarcts in both  the right basal ganglia and left thalamic areas. Neuro was consulted in ED and initiated workup with CT MRA of head and neck which were largely unremarkable except for possible developing aneurysm in the anterior communicating artery. Asked for patient to be admitted to medical team.   Patient Active Problem List  Diagnosis  . Hypertension  . Stroke  . Hyperlipidemia  . Tobacco abuse  . Sick sinus syndrome  . GERD (gastroesophageal reflux disease)  . Chronic pain syndrome   Past Medical History: Past Medical History  Diagnosis Date  . Hypertension     amlodipine, lisinopril  . Stroke     was on aspirin before stroke. July 2012 in [place].  sent home aspirin.   Marland Kitchen Hyperlipidemia     zocor 40mg   . Tobacco abuse     14-15 pack years at 2 packs per week  . Sick sinus syndrome     s/p pacemaker, no defibrillator  . GERD (gastroesophageal reflux disease)   . Chronic pain syndrome     on methadone and percocet   Past Surgical History: Past Surgical History  Procedure Date  . Pacemaker insertion   . Back surgery     5 back surgeries  . Hand surgery     3 broken bones  . Bunionectomy     right foot   Social History: History  Substance Use Topics  . Smoking status: Current Every Day Smoker  . Smokeless tobacco: Not on file  . Alcohol Use: No   For any additional social history documentation, please refer to relevant sections of EMR.  Family History: Family History  Problem Relation Age of Onset  . Stroke      grandfather and uncle died of stroke  . Heart attack      uncle  . Cancer Father     throat  . Transient ischemic attack Mother     61 and doing well   Allergies: No Known Allergies No current facility-administered medications on file prior to encounter.   Current Outpatient Prescriptions on File Prior to Encounter  Medication Sig Dispense Refill  . lisinopril-hydrochlorothiazide (PRINZIDE,ZESTORETIC) 10-12.5 MG per tablet Take 1 tablet by mouth daily.  30  tablet  0  . ranitidine (ZANTAC) 150 MG tablet Take 150 mg by mouth daily.      . simvastatin (ZOCOR) 20 MG tablet Take 20 mg by mouth every evening.       Review Of Systems: Per HPI  Otherwise 12 point review of systems was performed and was unremarkable.  Physical Exam: BP 136/94  Pulse 68  Temp 97.8 F (36.6 C) (Oral)  Resp 20  SpO2 94% Gen: NAD, resting comfortably in bed, laughing, cheerful HEENT: NCAT, MMM, PERRLA on my exam (previously noted L>R pupil size), severe photophobia noted with left eye CV: RRR no mrg  Lungs: CTAB  Abd: soft/nontender/nondistended/normal bowel sounds  MSK: moves all extremities, no edema  Skin: warm and dry, no rash  Neuro: alert and oriented x3. CN II-XII intatc with exception of following deficits: slight ptosis of right eye,very slight asymetry on smile with left side limited compared to right.  Sensation normal on right side with fine touch decreased  on left. reflexes normal symmetric throughout, 5/5 muscle strength in upper and lower extremities on right with 4/5 strength throughout left. Normal finger to nose on my exam.      Labs and Imaging: Results for orders placed during the hospital encounter of 05/31/12 (from the past 24 hour(s))  GLUCOSE, CAPILLARY     Status: Normal   Collection Time   05/31/12  2:29 PM      Component Value Range   Glucose-Capillary 97  70 - 99 mg/dL  CBC     Status: Normal   Collection Time   05/31/12  2:30 PM      Component Value Range   WBC 5.1  4.0 - 10.5 K/uL   RBC 5.00  4.22 - 5.81 MIL/uL   Hemoglobin 15.3  13.0 - 17.0 g/dL   HCT 16.1  09.6 - 04.5 %   MCV 85.8  78.0 - 100.0 fL   MCH 30.6  26.0 - 34.0 pg   MCHC 35.7  30.0 - 36.0 g/dL   RDW 40.9  81.1 - 91.4 %   Platelets 189  150 - 400 K/uL  BASIC METABOLIC PANEL     Status: Abnormal   Collection Time   05/31/12  2:30 PM      Component Value Range   Sodium 137  135 - 145 mEq/L   Potassium 3.9  3.5 - 5.1 mEq/L   Chloride 100  96 - 112 mEq/L   CO2  28  19 - 32 mEq/L   Glucose, Bld 106 (*) 70 - 99 mg/dL   BUN 12  6 - 23 mg/dL   Creatinine, Ser 7.82  0.50 - 1.35 mg/dL   Calcium 9.9  8.4 - 95.6 mg/dL   GFR calc non Af Amer 61 (*) >90 mL/min   GFR calc Af Amer 71 (*) >90 mL/min  TROPONIN I     Status: Normal   Collection Time   05/31/12  2:30 PM      Component Value Range   Troponin I <0.30  <0.30 ng/mL  PROTIME-INR     Status: Normal   Collection Time   05/31/12  2:30 PM      Component Value Range   Prothrombin Time 11.7  11.6 - 15.2 seconds   INR 0.86  0.00 - 1.49  APTT     Status: Normal   Collection Time   05/31/12  2:30 PM      Component Value Range   aPTT 28  24 - 37 seconds  CBC     Status: Normal   Collection Time   05/31/12 10:34 PM      Component Value Range   WBC 5.2  4.0 - 10.5 K/uL   RBC 5.01  4.22 - 5.81 MIL/uL   Hemoglobin 15.1  13.0 - 17.0 g/dL   HCT 21.3  08.6 - 57.8 %   MCV 86.4  78.0 - 100.0 fL   MCH 30.1  26.0 - 34.0 pg   MCHC 34.9  30.0 - 36.0 g/dL   RDW 46.9  62.9 - 52.8 %   Platelets 184  150 - 400 K/uL  CREATININE, SERUM     Status: Abnormal   Collection Time   05/31/12 10:34 PM      Component Value Range   Creatinine, Ser 1.30  0.50 - 1.35 mg/dL   GFR calc non Af Amer 60 (*) >90 mL/min   GFR calc Af Amer 70 (*) >90 mL/min  TROPONIN I  Status: Normal   Collection Time   05/31/12 10:34 PM      Component Value Range   Troponin I <0.30  <0.30 ng/mL    Ct Head Wo Contrast 05/31/2012  IMPRESSION: No evidence of acute intracranial abnormality.  Nonspecific white matter hypodensities- may represent chronic small vessel white matter ischemic changes.  Remote right basal ganglia and left thalamic infarcts.     Ct Angio Head W/cm &/or Wo Cm 05/31/2012 IMPRESSION: 1.  No intracranial stenosis or major circle Willis branch occlusion. 2.  Ectatic anterior communicating artery.  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 Imaging surveillance of this finding  is suggested, such as biannual intracranial CTA (which could then be discontinued or spaced further if this finding is found to be stable over several years). 3.  Stable CT appearance of the brain; chronic small vessel ischemia.   Original Report Authenticated By: Harley Hallmark, M.D.    Ct Angio Neck W/cm &/or Wo/cm 05/31/2012    IMPRESSION:  1.  Negative neck CTA.    Dg Chest 2 View 05/31/2012   No acute infiltrate or pulmonary edema.     Tana Conch, MD, PGY2 06/01/2012 12:42 AM

## 2012-05-31 NOTE — ED Notes (Signed)
Pt is here with dizziness with left eye pain for 3 days.  Headache to left head today, pt reports chest pain, sob, and nausea that started today.

## 2012-05-31 NOTE — ED Notes (Signed)
MD at bedside. Neuro consult

## 2012-05-31 NOTE — ED Notes (Signed)
LEft arm weakness---Hx of strokes in past with tia and severe headache with dizziness

## 2012-05-31 NOTE — ED Notes (Signed)
Patient transported to CT 

## 2012-05-31 NOTE — Progress Notes (Signed)
Referring Physician:  Dr. Ranae Palms    Chief Complaint: headache, left sided weakness  HPI: Dennis Brock is an 56 y.o. male who LKN 2100 yesterday 05/30/12, woke up to urinate 0200 today and had a headache. Went back to sleep and awoke again with headache which worsened and at 1300 he began having dizziness "room spinning", he has sensitivity to light in his right eye. He later states that his left sided weakness started 2 days ago. He states that this is just like the sx that he experienced 2 mos ago when he was admitted with a stroke at an outlying hospital. No MRI due to PPM. He also stated that he was having some chest pain earlier today with shortness of breath which has now cleared.  Current headache location: left temporal "sharpe", right posterior auricular. He is wanting medication for pain control.  2 mos ago when he was admitted for stroke he stated that he had same sx, was admitted, had generalized weakness which resolved and he had no residual problems. He was told that he had experienced 2 strokes. He was sent home at that time on a baby aspirin. CT today confirms remote right basal ganglia and left thalamic infarcts.  LSN: 2100 on 05/30/2012 (of headache), left sided weakness "2 days ago" tPA Given: No: out of window, reported recent stroke 2 mos ago at outlying facility  Past Medical History  Diagnosis Date  . Hypertension   . Stroke     Past Surgical History  Procedure Date  . Pacemaker insertion     No family history on file. Social History:  reports that he has been smoking.  He does not have any smokeless tobacco history on file. He reports that he does not drink alcohol or use illicit drugs.  Allergies: No Known Allergies No current facility-administered medications for this encounter.   Current Outpatient Prescriptions  Medication Sig Dispense Refill  . amLODipine (NORVASC) 10 MG tablet Take 10 mg by mouth daily.      Marland Kitchen aspirin 81 MG chewable tablet Chew 81  mg by mouth daily.      Marland Kitchen lisinopril-hydrochlorothiazide (PRINZIDE,ZESTORETIC) 10-12.5 MG per tablet Take 1 tablet by mouth daily.  30 tablet  0  . naproxen sodium (ANAPROX) 220 MG tablet Take 220 mg by mouth 2 (two) times daily as needed. As needed for pain.      . ranitidine (ZANTAC) 150 MG tablet Take 150 mg by mouth daily.      . simvastatin (ZOCOR) 20 MG tablet Take 20 mg by mouth every evening.        ROS: History obtained from the patient  General ROS: negative for - chills, fatigue, fever, night sweats, weight gain or weight loss Psychological ROS: negative for - behavioral disorder, hallucinations, memory difficulties, mood swings or suicidal ideation Ophthalmic ROS: negative for - blurry vision,  eye pain or loss of vision, +double vision, ENT ROS: negative for - epistaxis, nasal discharge, oral lesions, sore throat, tinnitus or vertigo Allergy and Immunology ROS: negative for - hives or itchy/watery eyes Hematological and Lymphatic ROS: negative for - bleeding problems, bruising or swollen lymph nodes Endocrine ROS: negative for - galactorrhea, hair pattern changes, polydipsia/polyuria or temperature intolerance Respiratory ROS: negative for - cough, hemoptysis, shortness of breath or wheezing Cardiovascular ROS: negative for edema or irregular heartbeat, +- chest pain, shortness of breath Gastrointestinal ROS: negative for - abdominal pain, diarrhea, hematemesis, nausea/vomiting or stool incontinence Genito-Urinary ROS: negative for - dysuria, hematuria, incontinence  or urinary frequency/urgency Musculoskeletal ROS: negative for - joint swelling or muscular weakness Neurological ROS: as noted in HPI Dermatological ROS: negative for rash and skin lesion changes    Physical Examination: Blood pressure 104/71, pulse 64, temperature 98.2 F (36.8 C), temperature source Oral, resp. rate 20, SpO2 98.00%.  Neurologic Examination: Mental Status: Alert, oriented, thought content  appropriate.  Speech fluent without evidence of aphasia.  Able to follow 3 step commands without difficulty. Cranial Nerves: II: visual fields grossly normal, pupils unequal L>R, photophobia left eye. Discs are sharp III,IV, VI: + ptosis on right, extra-ocular motions intact bilaterally V,VII: smile symmetric, facial light touch sensation decreased left face VIII: hearing normal bilaterally IX,X: gag reflex present XI: trapezius strength/neck flexion strength normal bilaterally XII: tongue strength normal  Motor: Right : Upper extremity   5/5    Left:     Upper extremity   4+/5  Lower extremity   5/5     Lower extremity   4/5 Tone and bulk:normal tone throughout; no atrophy noted Sensory: Pinprick and light touch/temperature decreased left body Deep Tendon Reflexes: 2+ and symmetric throughout Plantars: Right: downgoing   Left: downgoing Cerebellar: Finger to nose testing with mild horizontal tremor on right, intact on left.  Gait: not tested due to concern for acute stroke/dissection    Results for orders placed during the hospital encounter of 05/31/12 (from the past 48 hour(s))  GLUCOSE, CAPILLARY     Status: Normal   Collection Time   05/31/12  2:29 PM      Component Value Range Comment   Glucose-Capillary 97  70 - 99 mg/dL   CBC     Status: Normal   Collection Time   05/31/12  2:30 PM      Component Value Range Comment   WBC 5.1  4.0 - 10.5 K/uL    RBC 5.00  4.22 - 5.81 MIL/uL    Hemoglobin 15.3  13.0 - 17.0 g/dL    HCT 19.1  47.8 - 29.5 %    MCV 85.8  78.0 - 100.0 fL    MCH 30.6  26.0 - 34.0 pg    MCHC 35.7  30.0 - 36.0 g/dL    RDW 62.1  30.8 - 65.7 %    Platelets 189  150 - 400 K/uL   BASIC METABOLIC PANEL     Status: Abnormal   Collection Time   05/31/12  2:30 PM      Component Value Range Comment   Sodium 137  135 - 145 mEq/L    Potassium 3.9  3.5 - 5.1 mEq/L    Chloride 100  96 - 112 mEq/L    CO2 28  19 - 32 mEq/L    Glucose, Bld 106 (*) 70 - 99 mg/dL    BUN  12  6 - 23 mg/dL    Creatinine, Ser 8.46  0.50 - 1.35 mg/dL    Calcium 9.9  8.4 - 96.2 mg/dL    GFR calc non Af Amer 61 (*) >90 mL/min    GFR calc Af Amer 71 (*) >90 mL/min   TROPONIN I     Status: Normal   Collection Time   05/31/12  2:30 PM      Component Value Range Comment   Troponin I <0.30  <0.30 ng/mL    Dg Chest 2 View 05/31/2012  IMPRESSION: No active disease.   Original Report Authenticated By: Natasha Mead, M.D.    Ct Head Wo Contrast 05/31/2012  IMPRESSION:  No evidence of acute intracranial abnormality.  Nonspecific white matter hypodensities- may represent chronic small vessel white matter ischemic changes.  Remote right basal ganglia and left thalamic infarcts.   Original Report Authenticated By: Rosendo Gros, M.D.     Assessment: 56 y.o. male with headache, right sided ptosis and dysmetria, left hemiparesis and hypesthesia. Suspect pontine infarct but will CT Angio to eval for dissection given headache. He cannot have an MRI due to a pacemaker.   Stroke Risk Factors - hyperlipidemia, hypertension and smoking  Plan: 1. HgbA1c, fasting lipid panel 2. CT Angio Head and Neck. 3. PT consult, OT consult, Speech consult 4. Echocardiogram 5. Carotid dopplers 6. Prophylactic therapy-Antiplatelet med: Plavix - CTA Pending 7. Risk factor modification 8. Telemetry monitoring 9. Frequent neuro checks  Job Founds, MBA, [facility] Pager [phone]

## 2012-05-31 NOTE — ED Provider Notes (Signed)
History     CSN: [id number]  Arrival date & time 05/31/12  1401   First MD Initiated Contact with Patient 05/31/12 1500      Chief Complaint  Patient presents with  . Chest Pain  . Shortness of Breath  . Nausea  . Headache  . Dizziness    (Consider location/radiation/quality/duration/timing/severity/associated sxs/prior treatment) HPI Pt presents with multiple complaints. Pt had gradual onset L frontal and behind L eye. HA starting last night. No previously similar HA in the past. No visual changes.  +LUE/LLE numbness that started this morning with sharp substernal chest pain. He has had SOB for several days worse with exertion. No N/V/D, cough, fever, chills, no lower ext swelling or pain.  Pt states he was in [place] 2 months ago for TIA.   Past Medical History  Diagnosis Date  . Hypertension     amlodipine, lisinopril  . Stroke     was on aspirin before stroke. July 2012 in [place].  sent home aspirin.   Marland Kitchen Hyperlipidemia     zocor 40mg   . Tobacco abuse     14-15 pack years at 2 packs per week  . Sick sinus syndrome     s/p pacemaker, no defibrillator  . GERD (gastroesophageal reflux disease)   . Chronic pain syndrome     on methadone and percocet    Past Surgical History  Procedure Date  . Pacemaker insertion   . Back surgery     5 back surgeries  . Hand surgery     3 broken bones  . Bunionectomy     right foot    Family History  Problem Relation Age of Onset  . Stroke      grandfather and uncle died of stroke  . Heart attack      uncle  . Cancer Father     throat  . Transient ischemic attack Mother     23 and doing well    History  Substance Use Topics  . Smoking status: Current Every Day Smoker  . Smokeless tobacco: Not on file  . Alcohol Use: No      Review of Systems  Constitutional: Positive for fatigue. Negative for fever and chills.  HENT: Positive for ear pain and sinus pressure. Negative for neck pain.   Eyes: Negative for visual  disturbance.  Respiratory: Positive for shortness of breath. Negative for cough and wheezing.   Cardiovascular: Positive for chest pain. Negative for palpitations and leg swelling.  Gastrointestinal: Negative for nausea, vomiting and abdominal pain.  Musculoskeletal: Negative for back pain.  Skin: Negative for rash.  Neurological: Positive for weakness, numbness and headaches. Negative for dizziness.    Allergies  Review of patient's allergies indicates no known allergies.  Home Medications   No current outpatient prescriptions on file.  BP 115/82  Pulse 57  Temp 97.6 F (36.4 C) (Oral)  Resp 18  Ht 6\' 1"  (1.854 m)  Wt 304 lb 4.8 oz (138.03 kg)  BMI 40.15 kg/m2  SpO2 95%  Physical Exam  Nursing note and vitals reviewed. Constitutional: He is oriented to person, place, and time. He appears well-developed and well-nourished. No distress.       Talking on phone when enter room. No distress  HENT:  Head: Normocephalic and atraumatic.  Mouth/Throat: Oropharynx is clear and moist.       Bl bulging of TM's Mild L frontal sinus percussion   Eyes: EOM are normal. Pupils are equal, round, and reactive to  light.  Neck: Normal range of motion. Neck supple.       No posterior cervical TTP  Cardiovascular: Normal rate and regular rhythm.   Pulmonary/Chest: Effort normal and breath sounds normal. No respiratory distress. He has no wheezes. He has no rales. He exhibits no tenderness.  Abdominal: Soft. Bowel sounds are normal. He exhibits no mass. There is no tenderness. There is no rebound and no guarding.  Musculoskeletal: Normal range of motion. He exhibits no edema and no tenderness.       No calf swelling or tenderness  Neurological: He is alert and oriented to person, place, and time.       4/5 motor LUE/LLE. Question poor effort. Subjective decreased sensation. CN II-XII intact  Skin: Skin is warm and dry. No rash noted. No erythema.  Psychiatric: He has a normal mood and affect.  His behavior is normal.    ED Course  Procedures (including critical care time)  Labs Reviewed  BASIC METABOLIC PANEL - Abnormal; Notable for the following:    Glucose, Bld 106 (*)     GFR calc non Af Amer 61 (*)     GFR calc Af Amer 71 (*)     All other components within normal limits  CREATININE, SERUM - Abnormal; Notable for the following:    GFR calc non Af Amer 60 (*)     GFR calc Af Amer 70 (*)     All other components within normal limits  CBC  TROPONIN I  GLUCOSE, CAPILLARY  PROTIME-INR  APTT  CBC  TROPONIN I  HEMOGLOBIN A1C  LIPID PANEL  TSH  TROPONIN I   Ct Angio Head W/cm &/or Wo Cm  05/31/2012  *RADIOLOGY REPORT*  Clinical Data:  56 year old male with headache, left side weakness, nausea, left facial droop.  History of recent stroke.  CT ANGIOGRAPHY HEAD AND NECK  Technique:  Multidetector CT imaging of the head and neck was performed using the standard protocol during bolus administration of intravenous contrast.  Multiplanar CT image reconstructions including MIPs were obtained to evaluate the vascular anatomy. Carotid stenosis measurements (when applicable) are obtained utilizing NASCET criteria, using the distal internal carotid diameter as the denominator.  Contrast: 75mL OMNIPAQUE IOHEXOL 350 MG/ML SOLN  Comparison:  Head CT without contrast 05/31/2012.  CTA NECK  Findings:  Left chest cardiac pacemaker visible on scout view. Minor atelectasis in the lung apices.  No superior mediastinal lymphadenopathy.  Negative thyroid, larynx, pharynx, parapharyngeal spaces, sublingual space, submandibular glands, parotid glands and orbit.  No cervical lymphadenopathy.  Retropharyngeal course of both carotids, otherwise negative retropharyngeal space. Visualized paranasal sinuses and mastoids are clear.  Degenerative changes in the cervical spine. No acute osseous abnormality identified.  Vascular Findings: No arch atherosclerosis.  Three-vessel arch configuration.  Normal right  common carotid artery origin.  The retropharyngeal course of the right common carotid artery.  Retropharyngeal right carotid bifurcation which is otherwise normal.  Normal cervical right ICA.  Normal right subclavian artery origin.  Normal right vertebral artery origin.  Right vertebral artery is nondominant and within normal limits throughout the neck.  Normal left common carotid artery origin.  Mild tortuosity the proximal left common carotid.  Retropharyngeal course of the distal left CCA.  Partially retropharyngeal left carotid bifurcation which is otherwise normal.  Mildly tortuous, otherwise negative cervical left ICA.  Tortuous but otherwise normal proximal left vertebral artery.  The left vertebral artery is dominant and within normal limits throughout the neck.   Review  of the MIP images confirms the above findings.  IMPRESSION:  1.  Negative neck CTA.  Dominant left vertebral artery. 2.  See intracranial findings below.  CTA HEAD  Findings:  Chronic-appearing right corona radiata to external capsule lacunar type infarct.  No ventriculomegaly.  Chronic- appearing left lateral thalamus lacunar type infarct. No midline shift, mass effect, or evidence of mass lesion.  No acute intracranial hemorrhage identified.  Occasional patchy subcortical white matter hypodensity is stable. No evidence of cortically based acute infarction identified.  No abnormal enhancement identified. Calvarium intact. Visualized scalp soft tissues are within normal limits.  Vascular Findings: Major intracranial venous structures are enhancing.  Normal distal vertebral arteries, the left is dominant.  Normal left PICA.  Normal right PICA.  Normal vertebrobasilar junction. Patent AICAs.  No basilar artery stenosis.  SCA and PCA origins are within normal limits.  Posterior communicating arteries are diminutive or absent.  Bilateral PCA branches are within normal limits.  Both ICA siphons are patent with only minimal calcified  atherosclerosis.  No ICA siphon stenosis.  Normal bilateral ophthalmic artery origins.  Normal carotid termini, MCA and ACA origins.  There is ectasia of the anterior communicating artery with a subtle saccular outpouching directed anteriorly (axial 1 mm image number 252).  There is not measurable aneurysm at this time.  The smaller involve the right ACA A2 origin than the left.  Other ACA branches are within normal limits.  Bilateral MCA M1 segments are patent and within normal limits. Left MCA branches are within normal limits.  Right MCA branches are within normal limits.  No MCA major branch stenosis or occlusion identified.   Review of the MIP images confirms the above findings.  IMPRESSION: 1.  No intracranial stenosis or major circle Willis branch occlusion. 2.  Ectatic anterior communicating artery.  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 Imaging surveillance of this finding is suggested, such as biannual intracranial CTA (which could then be discontinued or spaced further if this finding is found to be stable over several years). 3.  Stable CT appearance of the brain; chronic small vessel ischemia.   Original Report Authenticated By: Harley Hallmark, M.D.    Dg Chest 2 View  05/31/2012  *RADIOLOGY REPORT*  Clinical Data: Left-sided chest pain, shortness of breath  CHEST - 2 VIEW  Comparison: None.  Findings: No mediastinal silhouette is unremarkable.  No acute infiltrate or pulmonary edema.  Dual lead cardiac pacemaker with leads in the right atrium and right ventricle.  Bony thorax is unremarkable.  IMPRESSION: No active disease.   Original Report Authenticated By: Natasha Mead, M.D.    Ct Head Wo Contrast  05/31/2012  *RADIOLOGY REPORT*  Clinical Data: 56 year old male with headache and dizziness. History of recent CVA.  CT HEAD WITHOUT CONTRAST  Technique:  Contiguous axial images were obtained from the base of the skull through the vertex without  contrast.  Comparison: None  Findings: Remote infarcts in the right basal ganglia and left thalamus are noted. Subtle areas of periventricular and bilateral parietal white matter hypodensities are nonspecific but may be related to chronic small vessel white matter ischemic changes.  No acute intracranial abnormalities are identified, including mass lesion or mass effect, hydrocephalus, extra-axial fluid collection, midline shift, hemorrhage, or acute infarction.  The visualized bony calvarium is unremarkable.  IMPRESSION: No evidence of acute intracranial abnormality.  Nonspecific white matter hypodensities- may represent chronic small vessel white matter ischemic changes.  Remote right basal ganglia and left thalamic infarcts.   Original Report Authenticated By: Rosendo Gros, M.D.    Ct Angio Neck W/cm &/or Wo/cm  05/31/2012  *RADIOLOGY REPORT*  Clinical Data:  56 year old male with headache, left side weakness, nausea, left facial droop.  History of recent stroke.  CT ANGIOGRAPHY HEAD AND NECK  Technique:  Multidetector CT imaging of the head and neck was performed using the standard protocol during bolus administration of intravenous contrast.  Multiplanar CT image reconstructions including MIPs were obtained to evaluate the vascular anatomy. Carotid stenosis measurements (when applicable) are obtained utilizing NASCET criteria, using the distal internal carotid diameter as the denominator.  Contrast: 75mL OMNIPAQUE IOHEXOL 350 MG/ML SOLN  Comparison:  Head CT without contrast 05/31/2012.  CTA NECK  Findings:  Left chest cardiac pacemaker visible on scout view. Minor atelectasis in the lung apices.  No superior mediastinal lymphadenopathy.  Negative thyroid, larynx, pharynx, parapharyngeal spaces, sublingual space, submandibular glands, parotid glands and orbit.  No cervical lymphadenopathy.  Retropharyngeal course of both carotids, otherwise negative retropharyngeal space. Visualized paranasal sinuses and  mastoids are clear.  Degenerative changes in the cervical spine. No acute osseous abnormality identified.  Vascular Findings: No arch atherosclerosis.  Three-vessel arch configuration.  Normal right common carotid artery origin.  The retropharyngeal course of the right common carotid artery.  Retropharyngeal right carotid bifurcation which is otherwise normal.  Normal cervical right ICA.  Normal right subclavian artery origin.  Normal right vertebral artery origin.  Right vertebral artery is nondominant and within normal limits throughout the neck.  Normal left common carotid artery origin.  Mild tortuosity the proximal left common carotid.  Retropharyngeal course of the distal left CCA.  Partially retropharyngeal left carotid bifurcation which is otherwise normal.  Mildly tortuous, otherwise negative cervical left ICA.  Tortuous but otherwise normal proximal left vertebral artery.  The left vertebral artery is dominant and within normal limits throughout the neck.   Review of the MIP images confirms the above findings.  IMPRESSION:  1.  Negative neck CTA.  Dominant left vertebral artery. 2.  See intracranial findings below.  CTA HEAD  Findings:  Chronic-appearing right corona radiata to external capsule lacunar type infarct.  No ventriculomegaly.  Chronic- appearing left lateral thalamus lacunar type infarct. No midline shift, mass effect, or evidence of mass lesion.  No acute intracranial hemorrhage identified.  Occasional patchy subcortical white matter hypodensity is stable. No evidence of cortically based acute infarction identified.  No abnormal enhancement identified. Calvarium intact. Visualized scalp soft tissues are within normal limits.  Vascular Findings: Major intracranial venous structures are enhancing.  Normal distal vertebral arteries, the left is dominant.  Normal left PICA.  Normal right PICA.  Normal vertebrobasilar junction. Patent AICAs.  No basilar artery stenosis.  SCA and PCA origins are  within normal limits.  Posterior communicating arteries are diminutive or absent.  Bilateral PCA branches are within normal limits.  Both ICA siphons are patent with only minimal calcified atherosclerosis.  No ICA siphon stenosis.  Normal bilateral ophthalmic artery origins.  Normal carotid termini, MCA and ACA origins.  There is ectasia of the anterior communicating artery with a subtle saccular outpouching directed anteriorly (axial 1 mm image number 252).  There is not measurable aneurysm at this time.  The smaller involve the right ACA A2 origin than the left.  Other ACA branches are within normal limits.  Bilateral MCA M1 segments are patent and within normal limits. Left MCA branches are within  normal limits.  Right MCA branches are within normal limits.  No MCA major branch stenosis or occlusion identified.   Review of the MIP images confirms the above findings.  IMPRESSION: 1.  No intracranial stenosis or major circle Willis branch occlusion. 2.  Ectatic anterior communicating artery.  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 Imaging surveillance of this finding is suggested, such as biannual intracranial CTA (which could then be discontinued or spaced further if this finding is found to be stable over several years). 3.  Stable CT appearance of the brain; chronic small vessel ischemia.   Original Report Authenticated By: Harley Hallmark, M.D.      No diagnosis found.   Date: 05/31/2012  Rate: 78  Rhythm: normal sinus rhythm  QRS Axis: normal  Intervals: normal  ST/T Wave abnormalities: normal  Conduction Disutrbances:none  Narrative Interpretation:   Old EKG Reviewed: none available    MDM  Pt with symptoms >4-5 hours. Outside window for tPA.   Discussed with Dr Amada Jupiter who suggested CTA head and neck. Will see in ED  Per Dr Amada Jupiter pt states he has had weakness 2-3 days. Concern for dissection vs pontine CVA. CTA pending  will admit to medicine  Discussed with Family Medicine resident who will see pt in Ed and admit for CVA workup and chest pain R/O  Loren Racer, MD 06/01/12 0003

## 2012-06-01 DIAGNOSIS — I517 Cardiomegaly: Secondary | ICD-10-CM

## 2012-06-01 LAB — LIPID PANEL
HDL: 50 mg/dL (ref >39)
LDL Cholesterol: 70 mg/dL (ref 0–99)

## 2012-06-01 LAB — HEMOGLOBIN A1C
Hgb A1c MFr Bld: 6 % — ABNORMAL HIGH (ref <5.7)
Mean Plasma Glucose: 126 mg/dL — ABNORMAL HIGH (ref <117)

## 2012-06-01 LAB — TSH: TSH: 1.626 u[IU]/mL (ref 0.350–4.500)

## 2012-06-01 LAB — GLUCOSE, CAPILLARY: Glucose-Capillary: 101 mg/dL — ABNORMAL HIGH (ref 70–99)

## 2012-06-01 LAB — TROPONIN I: Troponin I: <0.3 ng/mL (ref <0.30)

## 2012-06-01 MED ORDER — METHADONE HCL 10 MG PO TABS
10.0000 mg | ORAL_TABLET | Freq: Four times a day (QID) | ORAL | Status: DC | PRN
  Administered 2012-06-01 – 2012-06-02 (×3): 10 mg via ORAL
  Filled 2012-06-01 (×3): qty 1

## 2012-06-01 MED ORDER — ALUM & MAG HYDROXIDE-SIMETH 200-200-20 MG/5ML PO SUSP
15.0000 mL | ORAL | Status: DC | PRN
  Administered 2012-06-01: 15 mL via ORAL
  Filled 2012-06-01: qty 30

## 2012-06-01 MED ORDER — NICOTINE 21 MG/24HR TD PT24
21.0000 mg | MEDICATED_PATCH | TRANSDERMAL | Status: DC
  Administered 2012-06-01: 21 mg via TRANSDERMAL
  Filled 2012-06-01 (×2): qty 1

## 2012-06-01 NOTE — Progress Notes (Signed)
PGY-1 Daily Progress Note Family Medicine Teaching Service Dennis R. Donte Kary, DO Service Pager: [phone]   Subjective: Pt going for dopplers when going to examine him.  Rechecked on him and feels he is better.   Objective:  VITALS Temp:  [97.6 F (36.4 C)-98.2 F (36.8 C)] 97.6 F (36.4 C) (10/02 2246) Pulse Rate:  [55-74] 65  (10/03 0800) Resp:  [18-20] 18  (10/02 2045) BP: (104-149)/(67-94) 149/93 mmHg (10/03 0800) SpO2:  [93 %-98 %] 93 % (10/03 0400) Weight:  [304 lb 4.8 oz (138.03 kg)] 304 lb 4.8 oz (138.03 kg) (10/02 2246)  In/Out No intake or output data in the 24 hours ending 06/01/12 0950  Physical Exam: Gen:  NAD HEENT: moist mucous membranes, [place]/AT, PERRLA CV: Regular rate and rhythm, no murmurs rubs or gallops PULM: clear to auscultation bilaterally. No wheezes/rales/rhonchi ABD: soft/nontender/nondistended/normal bowel sounds EXT: No edema Neuro: Alert and oriented x3, + Ptosis on R eye with possible facial droop on left, dose have weakness Left UE 4/5 MS, Otherwise 5/5 MS throughout   MEDS Scheduled Meds:    . aspirin  324 mg Oral Once  . clopidogrel  75 mg Oral Q breakfast  . influenza  inactive virus vaccine  0.5 mL Intramuscular Tomorrow-1000  . nicotine  7 mg Transdermal Daily  . pantoprazole  40 mg Oral Q1200  . pneumococcal 23 valent vaccine  0.5 mL Intramuscular Tomorrow-1000  . simvastatin  20 mg Oral QPM  . DISCONTD: famotidine  20 mg Oral BID  . DISCONTD: heparin  5,000 Units Subcutaneous Q8H   Continuous Infusions:  PRN Meds:.gabapentin, iohexol, morphine injection, ondansetron (ZOFRAN) IV, senna-docusate, DISCONTD:  morphine injection, DISCONTD:  morphine injection  Labs and imaging:   CBC  Lab 05/31/12 2234 05/31/12 1430  WBC 5.2 5.1  HGB 15.1 15.3  HCT 43.3 42.9  PLT 184 189   BMET/CMET  Lab 05/31/12 2234 05/31/12 1430  NA -- 137  K -- 3.9  CL -- 100  CO2 -- 28  BUN -- 12  CREATININE 1.30 1.28  CALCIUM -- 9.9  PROT -- --    BILITOT -- --  ALKPHOS -- --  ALT -- --  AST -- --  GLUCOSE -- 106*   Results for orders placed during the hospital encounter of 05/31/12 (from the past 24 hour(s))  GLUCOSE, CAPILLARY     Status: Normal   Collection Time   05/31/12  2:29 PM      Component Value Range   Glucose-Capillary 97  70 - 99 mg/dL  CBC     Status: Normal   Collection Time   05/31/12  2:30 PM      Component Value Range   WBC 5.1  4.0 - 10.5 K/uL   RBC 5.00  4.22 - 5.81 MIL/uL   Hemoglobin 15.3  13.0 - 17.0 g/dL   HCT 10.2  72.5 - 36.6 %   MCV 85.8  78.0 - 100.0 fL   MCH 30.6  26.0 - 34.0 pg   MCHC 35.7  30.0 - 36.0 g/dL   RDW 44.0  34.7 - 42.5 %   Platelets 189  150 - 400 K/uL  BASIC METABOLIC PANEL     Status: Abnormal   Collection Time   05/31/12  2:30 PM      Component Value Range   Sodium 137  135 - 145 mEq/L   Potassium 3.9  3.5 - 5.1 mEq/L   Chloride 100  96 - 112 mEq/L   CO2  28  19 - 32 mEq/L   Glucose, Bld 106 (*) 70 - 99 mg/dL   BUN 12  6 - 23 mg/dL   Creatinine, Ser 2.95  0.50 - 1.35 mg/dL   Calcium 9.9  8.4 - 62.1 mg/dL   GFR calc non Af Amer 61 (*) >90 mL/min   GFR calc Af Amer 71 (*) >90 mL/min  TROPONIN I     Status: Normal   Collection Time   05/31/12  2:30 PM      Component Value Range   Troponin I <0.30  <0.30 ng/mL  PROTIME-INR     Status: Normal   Collection Time   05/31/12  2:30 PM      Component Value Range   Prothrombin Time 11.7  11.6 - 15.2 seconds   INR 0.86  0.00 - 1.49  APTT     Status: Normal   Collection Time   05/31/12  2:30 PM      Component Value Range   aPTT 28  24 - 37 seconds  CBC     Status: Normal   Collection Time   05/31/12 10:34 PM      Component Value Range   WBC 5.2  4.0 - 10.5 K/uL   RBC 5.01  4.22 - 5.81 MIL/uL   Hemoglobin 15.1  13.0 - 17.0 g/dL   HCT 30.8  65.7 - 84.6 %   MCV 86.4  78.0 - 100.0 fL   MCH 30.1  26.0 - 34.0 pg   MCHC 34.9  30.0 - 36.0 g/dL   RDW 96.2  95.2 - 84.1 %   Platelets 184  150 - 400 K/uL  CREATININE, SERUM      Status: Abnormal   Collection Time   05/31/12 10:34 PM      Component Value Range   Creatinine, Ser 1.30  0.50 - 1.35 mg/dL   GFR calc non Af Amer 60 (*) >90 mL/min   GFR calc Af Amer 70 (*) >90 mL/min  TROPONIN I     Status: Normal   Collection Time   05/31/12 10:34 PM      Component Value Range   Troponin I <0.30  <0.30 ng/mL  LIPID PANEL     Status: Abnormal   Collection Time   06/01/12  6:15 AM      Component Value Range   Cholesterol 174  0 - 200 mg/dL   Triglycerides 324 (*) <150 mg/dL   HDL 50  >40 mg/dL   Total CHOL/HDL Ratio 3.5     VLDL 54 (*) 0 - 40 mg/dL   LDL Cholesterol 70  0 - 99 mg/dL  TROPONIN I     Status: Normal   Collection Time   06/01/12  6:16 AM      Component Value Range   Troponin I <0.30  <0.30 ng/mL   Ct Angio Head W/cm &/or Wo Cm  05/31/2012    IMPRESSION:  1.  Negative neck CTA.  Dominant left vertebral artery. 2.  See intracranial findings below.  CTA HEAD  Findings:  Chronic-appearing right corona radiata to external capsule lacunar type infarct.  No ventriculomegaly.  Chronic- appearing left lateral thalamus lacunar type infarct. No midline shift, mass effect, or evidence of mass lesion.  No acute intracranial hemorrhage identified.  Occasional patchy subcortical white matter hypodensity is stable. No evidence of cortically based acute infarction identified.  No abnormal enhancement identified. Calvarium intact. Visualized scalp soft tissues are within normal limits.  Vascular Findings:  Major intracranial venous structures are enhancing.  Normal distal vertebral arteries, the left is dominant.  Normal left PICA.  Normal right PICA.    Dg Chest 2 View  05/31/2012  *RADIOLOGY REPORT*  Clinical Data: Left-sided chest pain, shortness of breath  CHEST - 2 VIEW  Comparison: None.  Findings: No mediastinal silhouette is unremarkable.  No acute infiltrate or pulmonary edema.  Dual lead cardiac pacemaker with leads in the right atrium and right ventricle.  Bony  thorax is unremarkable.  IMPRESSION: No active disease.   Original Report Authenticated By: Natasha Mead, M.D.    Ct Head Wo Contrast  05/31/2012    IMPRESSION: No evidence of acute intracranial abnormality.  Nonspecific white matter hypodensities- may represent chronic small vessel white matter ischemic changes.  Remote right basal ganglia and left thalamic infarcts.   Original Report Authenticated By: Rosendo Gros, M.D.    Ct Angio Neck W/cm &/or Wo/cm  05/31/2012  IMPRESSION: 1.  No intracranial stenosis or major circle Willis branch occlusion. 2.  Ectatic anterior communicating artery.  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 Imaging surveillance of this finding is suggested, such as biannual intracranial CTA (which could then be discontinued or spaced further if this finding is found to be stable over several years). 3.  Stable CT appearance of the brain; chronic small vessel ischemia.   Original Report Authenticated By: Harley Hallmark, M.D.     Carotid Dopplers 06/01/12 Preliminary report: There is no ovbvious ICA stenosis. Vertebral artery flow is antegrade.      Assessment  Pt is a 57 y.o. patient with significant PMHx for multiple CVA's, HTN, tobacco admitted with left sided weakness concerning for an acute stroke   Plan:  1. Weakness/CVA/Anterior communication aneurysm - Concern for new aneurysm on CTA head/neck but no evidence of intracranial hemorrhage  1) A1C pending, Lipid profile < 200.  2) Neuro recommends stroke work up including echo (pending) , carotid dopplers which do not show ICA stenosis or retrograde flow, PT/OT speech checks.    3) Continues to have HA, tx with morphine currently.  Spoke with neurosurg who took a look at the Ct and CTA and states there is a low threshold for [facility].  If considering still, will consult IR for LP to look for xanthochromia.    4) Will continue with plavix per neurology.  75 mg qd, most  likely lifelong   5) Continue telemetry and neuro checks q 4 hrs.   2. Chest Pain-Wells criteria 0. TIMI risk score of 2. Cardiac Markers negative x 3, going for Echo today along with carotid dopplers.   1) Telemetry overnight did not show anything. Repeat AM EKG pending   2) Could be GI related and started on Protonix 40 mg PO  3) Continue Plavix, consider restarting ASA at 81 mg.   4) Consider outpt stress test upon d/c  3. Hyperlipidemia, Lipid Profile shows Chol of 172 and LDL of 70   1) Continue Zocor 20 mg   4. Tobacco abuse - Counseling given, consider CSW consult.  1) Nicoderm patch while inpt   5. HTN - Pt home HTN medications held for 24 hrs due to possibility of CVA.  Will restart today pending neurology recommendations.   6. Sick Sinus Syndrome - Pacemaker in place, tele continuous    FEN/GI: heart healthy, Saline Lock Prophylaxis:  SCD's , Protonix  Disposition: Pending neurology recommendations, d/c home with close f/u  and outpt testing  Code Status: Full Code   Dennis Woodle R. Hatem Cull DO, FMTS PGY-1

## 2012-06-01 NOTE — Progress Notes (Signed)
Pt had episode of chest pain. Pt stated "i woke up and my chest was hurting, i sat up and it got better" pt described pain as "thumping". EKG obtained and was neg. MD notified, no new orders. Pt back in bed laying down asleep.  will continue to monitor.

## 2012-06-01 NOTE — Clinical Documentation Improvement (Signed)
GENERIC DOCUMENTATION CLARIFICATION QUERY  THIS DOCUMENT IS NOT A PERMANENT PART OF THE MEDICAL RECORD  TO RESPOND TO THE THIS QUERY, FOLLOW THE INSTRUCTIONS BELOW:  1. If needed, update documentation for the patient's encounter via the notes activity.  2. Access this query again and click edit on the In [employer].  3. After updating, or not, click F2 to complete all highlighted (required) fields concerning your review. Select "additional documentation in the medical record" OR "no additional documentation provided".  4. Click Sign note button.  5. The deficiency will fall out of your In Basket *Please let us know if you are not able to complete this workflow by phone or e-mail (listed below).  Please update your documentation within the medical record to reflect your response to this query.                                                                                        06/01/12   Dear Dr. Durene Cal [facility],  In a better effort to capture your patient's severity of illness/SOI, risk of mortality/ROM, reflect appropriate length of stay and utilization of resources, a review of the patient medical record has revealed the following indicators.   PLEASE CLARIFY IN MEDICARE FRIENDLY TERMS IN NOTES AND DC SUMMARY "LEFT WEAKNESS".  Possible Clinical Conditions? - Left hemiparesis - Other Condition (please specify) - Cannot Clinically Determine  Supporting Information: - Risk Factors: CVA 2 months ago, concern for new CVA, possible developing aneurysm - Signs & Symptoms: per H&P:"worsening left sided weakness", PN 10/3:"weakness Left UE 4/5 MS"  You may use possible, probable, or suspect with inpatient documentation. possible, probable, suspected diagnoses MUST be documented at the time of discharge  Reviewed: additional documentation in the medical record  Thank You,  Beverley Fiedler RN Clinical Documentation Specialist: Pager: [phone] Health Information Management:  [phone] [place]   Complete  Twana First. Zoriah Pulice, DO of [facility] 06/02/2012, 10:04 AM

## 2012-06-01 NOTE — Progress Notes (Signed)
PT/OT Cancellation Note  Treatment cancelled today due to pt with current bedrest order. Attempted to contact MD to discuss with no success. Please update activity orders as able for PT/OT to complete formal evaluation. Thanks!  Saleha Kalp M 06/01/2012, 12:58 PM  06/01/2012 Cephus Shelling, PT, DPT [phone]

## 2012-06-01 NOTE — Progress Notes (Signed)
Stroke Team Progress Note  HISTORY  Dennis Brock is an 56 y.o. male who LKN 2100 yesterday 05/30/12, woke up to urinate 0200 today and had a headache. Went back to sleep and awoke again with headache which worsened and at 1300 he began having dizziness "room spinning", he has sensitivity to light in his right eye. He later states that his left sided weakness started 2 days ago. He states that this is just like the sx that he experienced 2 mos ago when he was admitted with a stroke at an outlying hospital. No MRI due to PPM. He also stated that he was having some chest pain earlier today with shortness of breath which has now cleared.   Current headache location: left temporal "sharpe", right posterior auricular. He is wanting medication for pain control.   2 mos ago when he was admitted for stroke he stated that he had same sx, was admitted, had generalized weakness which resolved and he had no residual problems. He was told that he had experienced 2 strokes. He was sent home at that time on a baby aspirin. CT today confirms remote right basal ganglia and left thalamic infarcts.    LSN: 2100 on 05/30/2012 (of headache), left sided weakness "2 days ago"  tPA Given: No: out of window, reported recent stroke 2 mos ago at outlying facility   SUBJECTIVE He is sitting alone in chair at window. Overall he feels his condition is rapidly improving. He feels less dizzy today and states that he feels stronger.  OBJECTIVE Most recent Vital Signs: Filed Vitals:   06/01/12 1200 06/01/12 1348 06/01/12 1600 06/01/12 1800  BP: 121/98 132/72 136/88 135/75  Pulse: 74 68    Temp:  97.9 F (36.6 C)    TempSrc:      Resp:  20    Height:      Weight:      SpO2:  97%     CBG (last 3)   Basename 05/31/12 1429  GLUCAP 97   Intake/Output from previous day:    IV Fluid Intake:     MEDICATIONS    . clopidogrel  75 mg Oral Q breakfast  . influenza  inactive virus vaccine  0.5 mL Intramuscular  Tomorrow-1000  . nicotine  7 mg Transdermal Daily  . pantoprazole  40 mg Oral Q1200  . pneumococcal 23 valent vaccine  0.5 mL Intramuscular Tomorrow-1000  . simvastatin  20 mg Oral QPM  . DISCONTD: famotidine  20 mg Oral BID  . DISCONTD: heparin  5,000 Units Subcutaneous Q8H   PRN:  gabapentin, methadone, ondansetron (ZOFRAN) IV, senna-docusate, DISCONTD:  morphine injection, DISCONTD:  morphine injection  Diet:  Cardiac thin liquids Activity:  Bedrest DVT Prophylaxis: ---  CLINICALLY SIGNIFICANT STUDIES Basic Metabolic Panel:  Lab 05/31/12 4098 05/31/12 1430  NA -- 137  K -- 3.9  CL -- 100  CO2 -- 28  GLUCOSE -- 106*  BUN -- 12  CREATININE 1.30 1.28  CALCIUM -- 9.9  MG -- --  PHOS -- --    CBC:  Lab 05/31/12 2234 05/31/12 1430  WBC 5.2 5.1  NEUTROABS -- --  HGB 15.1 15.3  HCT 43.3 42.9  MCV 86.4 85.8  PLT 184 189   Coagulation:  Lab 05/31/12 1430  LABPROT 11.7  INR 0.86   Cardiac Enzymes:  Lab 06/01/12 0616 05/31/12 2234 05/31/12 1430  CKTOTAL -- -- --  CKMB -- -- --  CKMBINDEX -- -- --  TROPONINI <0.30 <0.30 <  0.30   Lipid Panel    Component Value Date/Time   CHOL 174 06/01/2012 0615   TRIG 272* 06/01/2012 0615   HDL 50 06/01/2012 0615   CHOLHDL 3.5 06/01/2012 0615   VLDL 54* 06/01/2012 0615   LDLCALC 70 06/01/2012 0615   HgbA1C  Lab Results  Component Value Date   HGBA1C 6.0* 06/01/2012   Ct Angio Head W/cm &/or Wo Cm 05/31/2012  Negative neck CTA.  Dominant left vertebral artery. No intracranial stenosis or major circle Willis branch occlusion. Ectatic anterior communicating artery.  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 Imaging surveillance of this finding is suggested, such as biannual intracranial CTA (which could then be discontinued or spaced further if this finding is found to be stable over several years). Stable CT appearance of the brain; chronic small vessel ischemia.     Dg Chest 2  View 05/31/2012 No active disease.   Original Report Authenticated By: Natasha Mead, M.D.    Ct Head Wo Contrast 05/31/2012   No evidence of acute intracranial abnormality.  Nonspecific white matter hypodensities- may represent chronic small vessel white matter ischemic changes.  Remote right basal ganglia and left thalamic infarcts.     2D Echocardiogram Normal LV size with mild LV hypertrophy, EF 55%. Mildly dilated aortic root to 4.1 cm. Normal RV size and systolic function. No significant valvular abnormalities  Carotid Doppler    EKG  sinus bradycardia.   Therapy Recommendations PT - ; OT - ; ST -   Physical Exam  Obese middle aged [place] male not in distress.Awake alert. Afebrile. Head is nontraumatic. Neck is supple without bruit. Hearing is normal. Cardiac exam no murmur or gallop. Lungs are clear to auscultation. Distal pulses are well felt.  Neurological Exam : Awake alert oriented x 3 normal speech and language.Fundi not visualized. Vision fields and acuity seem normal. Mild saccadic dysmetria to left. Mild left lower face asymmetry. Tongue midline. No drift. Mild diminished fine finger movements on left. Orbits right over left upper extremity. Mild left grip weak.. Normal sensation . Normal coordination. Mild truncal ataxia. Gait deferred.  ASSESSMENT Mr. Dennis Brock is a 56 y.o. male presenting with left sided hemiparesis, frontal headache. Imaging confirms a remote right basal ganglia and left thalamic infarct. No dissection identified. Patient still does have symptoms and horizontal blurred vision and is felt to have a small vessel infarct secondary to small vessel disease as a migraine type picture should not prolong in this manner.  Work up underway. On aspirin 81 mg orally every day prior to admission. Now on clopidogrel 75 mg orally every day for secondary stroke prevention. Patient with resultant left hemiparesis.   Suspect small vessel stroke due to persistent  symptoms  Previous right basal ganglia and left thalamic infarct (2 mos ago)  Hypertension  Hyperlipidemia  Tobacco Abuse  Hospital day # 1  TREATMENT/PLAN  Continue clopidogrel 75 mg orally every day for secondary stroke prevention.  Await Carotid dopplers  Therapy consults pending  SCDs, may OOB with assist for therapy  Risk factor modification  Tobacco cessation counseling  Guy Franco, [facility],  MBA, [facility] Pager: [phone] 06/01/2012 8:02 PM  Scribe for Dr. Delia Heady, Stroke Center Medical Director. He has personally reviewed chart, pertinent data, examined the patient and developed the plan of care. Pager:  [phone]

## 2012-06-01 NOTE — Progress Notes (Signed)
  Echocardiogram 2D Echocardiogram has been performed.  Dennis Brock 06/01/2012, 9:59 AM

## 2012-06-01 NOTE — Progress Notes (Signed)
VASCULAR LAB PRELIMINARY  PRELIMINARY  PRELIMINARY  PRELIMINARY  Carotid Dopplers completed.    Preliminary report:  There is no ovbvious ICA stenosis.  Vertebral artery flow is antegrade.  Kourtnei Rauber, 06/01/2012, 10:35 AM

## 2012-06-01 NOTE — Care Management Note (Signed)
    Page 1 of 1   06/02/2012     10:50:56 AM   CARE MANAGEMENT NOTE 06/02/2012  Patient:  Dennis Brock, Dennis Brock   Account Number:  [id number]  Date Initiated:  06/01/2012  Documentation initiated by:  GRAVES-BIGELOW,Aquanetta Schwarz  Subjective/Objective Assessment:   Pt admitted with l sided weakness/ headache.     Action/Plan:   CM will f/u in am with disposition needs.   Anticipated DC Date:  06/03/2012   Anticipated DC Plan:  HOME W HOME HEALTH SERVICES      DC Planning Services  CM consult  PCP issues      Choice offered to / List presented to:             Status of service:  Completed, signed off Medicare Important Message given?   (If response is "NO", the following Medicare IM given date fields will be blank) Date Medicare IM given:   Date Additional Medicare IM given:    Discharge Disposition:  HOME/SELF CARE  Per UR Regulation:  Reviewed for med. necessity/level of care/duration of stay  If discussed at Long Length of Stay Meetings, dates discussed:    Comments:  06-02-12 [address] Tomi Bamberger, RN,BSN [phone] CM did speak to pt and he does not have a pcp. CM did give him the [facility] # and list of PCP's. No further needs from CM at this time.

## 2012-06-01 NOTE — H&P (Signed)
FMTS Attending Daily Note:  Renold Don MD  [phone] pager  Family Practice pager:  [phone]  I have reviewed this patient and the chart. I have discussed this patient with the resident and admitting attending Dr. Jennette Kettle.  I agree with their findings, assessment and care plan.

## 2012-06-01 NOTE — Progress Notes (Signed)
Dennis Brock, OTR/L Pager: [phone] 06/01/2012

## 2012-06-01 NOTE — Progress Notes (Signed)
FMTS Attending Daily Note:  Jeff Atiya Yera MD  [phone] pager  Family Practice pager:  [phone]  I have reviewed this patient and the chart. I have discussed this patient with the resident and admitting attending Dr. Neal.  I agree with their findings, assessment and care plan.  

## 2012-06-02 LAB — GLUCOSE, CAPILLARY
Glucose-Capillary: 118 mg/dL — ABNORMAL HIGH (ref 70–99)
Glucose-Capillary: 116 mg/dL — ABNORMAL HIGH (ref 70–99)

## 2012-06-02 MED ORDER — INFLUENZA VIRUS VACC SPLIT PF IM SUSP
0.5000 mL | Freq: Once | INTRAMUSCULAR | Status: AC
  Administered 2012-06-02: 0.5 mL via INTRAMUSCULAR
  Filled 2012-06-02: qty 0.5

## 2012-06-02 MED ORDER — LISINOPRIL 10 MG PO TABS
10.0000 mg | ORAL_TABLET | Freq: Every day | ORAL | Status: DC
  Administered 2012-06-02: 10 mg via ORAL
  Filled 2012-06-02: qty 1

## 2012-06-02 MED ORDER — METOPROLOL SUCCINATE 12.5 MG HALF TABLET: 12.5000 mg | ORAL_TABLET | Freq: Every day | ORAL | Status: DC

## 2012-06-02 MED ORDER — LISINOPRIL-HYDROCHLOROTHIAZIDE 10-12.5 MG PO TABS: 1.0000 | ORAL_TABLET | Freq: Every day | ORAL | Status: DC

## 2012-06-02 MED ORDER — PNEUMOCOCCAL VAC POLYVALENT 25 MCG/0.5ML IJ INJ: 0.5000 mL | INJECTION | INTRAMUSCULAR | Status: DC

## 2012-06-02 MED ORDER — OXYCODONE-ACETAMINOPHEN 5-325 MG PO TABS: 1.0000 | ORAL_TABLET | Freq: Four times a day (QID) | ORAL | Status: DC | PRN

## 2012-06-02 MED ORDER — OMEPRAZOLE 20 MG PO CPDR: 20.0000 mg | DELAYED_RELEASE_CAPSULE | Freq: Every day | ORAL | Status: DC

## 2012-06-02 MED ORDER — ENOXAPARIN SODIUM 40 MG/0.4ML ~~LOC~~ SOLN
40.0000 mg | SUBCUTANEOUS | Status: DC
  Filled 2012-06-02: qty 0.4

## 2012-06-02 MED ORDER — INFLUENZA VIRUS VACC SPLIT PF IM SUSP: 0.5000 mL | INTRAMUSCULAR | Status: DC

## 2012-06-02 MED ORDER — PNEUMOCOCCAL VAC POLYVALENT 25 MCG/0.5ML IJ INJ
0.5000 mL | INJECTION | Freq: Once | INTRAMUSCULAR | Status: AC
  Administered 2012-06-02: 0.5 mL via INTRAMUSCULAR
  Filled 2012-06-02: qty 0.5

## 2012-06-02 MED ORDER — HYDROCHLOROTHIAZIDE 12.5 MG PO CAPS
12.5000 mg | ORAL_CAPSULE | Freq: Every day | ORAL | Status: DC
  Administered 2012-06-02: 12.5 mg via ORAL
  Filled 2012-06-02: qty 1

## 2012-06-02 MED ORDER — AMLODIPINE BESYLATE 5 MG PO TABS
5.0000 mg | ORAL_TABLET | Freq: Every day | ORAL | Status: DC
  Filled 2012-06-02: qty 1

## 2012-06-02 MED ORDER — METOPROLOL SUCCINATE 12.5 MG HALF TABLET
12.5000 mg | ORAL_TABLET | Freq: Every day | ORAL | Status: DC
  Administered 2012-06-02: 12.5 mg via ORAL
  Filled 2012-06-02: qty 1

## 2012-06-02 MED ORDER — CLOPIDOGREL BISULFATE 75 MG PO TABS: 75.0000 mg | ORAL_TABLET | Freq: Every day | ORAL | Status: DC

## 2012-06-02 NOTE — Progress Notes (Signed)
PGY-1 Daily Progress Note Family Medicine Teaching Service [place] R. Nivan Melendrez, DO Service Pager: [phone]   Subjective: Pt did have one fall overnight where he tried going to the bathroom without assistance, and landed on his right side.  He did not lose consciousness, and had no notable ecchymosis/bruising.   Objective:  VITALS Temp:  [97.7 F (36.5 C)-98 F (36.7 C)] 97.7 F (36.5 C) (10/04 0400) Pulse Rate:  [57-74] 69  (10/04 0430) Resp:  [18-20] 18  (10/04 0400) BP: (121-159)/(72-109) 138/87 mmHg (10/04 0430) SpO2:  [94 %-97 %] 95 % (10/04 0400)  In/Out  Intake/Output Summary (Last 24 hours) at 06/02/12 0832 Last data filed at 06/02/12 0757  Gross per 24 hour  Intake    480 ml  Output      0 ml  Net    480 ml    Physical Exam: Gen:  NAD HEENT: moist mucous membranes, [place]/AT, PERRLA CV: Regular rate and rhythm, no murmurs rubs or gallops PULM: clear to auscultation bilaterally. No wheezes/rales/rhonchi ABD: soft/nontender/nondistended/normal bowel sounds EXT: No edema Neuro: Alert and oriented x3, + Ptosis on R eye with possible facial droop on left, dose have weakness Left UE 4/5 MS, Otherwise 5/5 MS throughout   MEDS Scheduled Meds:    . amLODipine  5 mg Oral Daily  . clopidogrel  75 mg Oral Q breakfast  . influenza  inactive virus vaccine  0.5 mL Intramuscular Tomorrow-1000  . nicotine  21 mg Transdermal Q24H  . pantoprazole  40 mg Oral Q1200  . pneumococcal 23 valent vaccine  0.5 mL Intramuscular Tomorrow-1000  . simvastatin  20 mg Oral QPM  . DISCONTD: lisinopril-hydrochlorothiazide  1 tablet Oral Daily  . DISCONTD: nicotine  7 mg Transdermal Daily   Continuous Infusions:  PRN Meds:.alum & mag hydroxide-simeth, gabapentin, methadone, ondansetron (ZOFRAN) IV, senna-docusate, DISCONTD:  morphine injection  Labs and imaging:   CBC  Lab 05/31/12 2234 05/31/12 1430  WBC 5.2 5.1  HGB 15.1 15.3  HCT 43.3 42.9  PLT 184 189   BMET/CMET  Lab 05/31/12 2234  05/31/12 1430  NA -- 137  K -- 3.9  CL -- 100  CO2 -- 28  BUN -- 12  CREATININE 1.30 1.28  CALCIUM -- 9.9  PROT -- --  BILITOT -- --  ALKPHOS -- --  ALT -- --  AST -- --  GLUCOSE -- 106*   Results for orders placed during the hospital encounter of 05/31/12 (from the past 24 hour(s))  GLUCOSE, CAPILLARY     Status: Abnormal   Collection Time   06/01/12  9:03 PM      Component Value Range   Glucose-Capillary 101 (*) 70 - 99 mg/dL  GLUCOSE, CAPILLARY     Status: Abnormal   Collection Time   06/02/12  7:55 AM      Component Value Range   Glucose-Capillary 118 (*) 70 - 99 mg/dL   Ct Angio Head W/cm &/or Wo Cm  05/31/2012    IMPRESSION:  1.  Negative neck CTA.  Dominant left vertebral artery. 2.  See intracranial findings below.  CTA HEAD  Findings:  Chronic-appearing right corona radiata to external capsule lacunar type infarct.  No ventriculomegaly.  Chronic- appearing left lateral thalamus lacunar type infarct. No midline shift, mass effect, or evidence of mass lesion.  No acute intracranial hemorrhage identified.  Occasional patchy subcortical white matter hypodensity is stable. No evidence of cortically based acute infarction identified.  No abnormal enhancement identified. Calvarium intact. Visualized  scalp soft tissues are within normal limits.  Vascular Findings: Major intracranial venous structures are enhancing.  Normal distal vertebral arteries, the left is dominant.  Normal left PICA.  Normal right PICA.    Dg Chest 2 View  05/31/2012  *RADIOLOGY REPORT*  Clinical Data: Left-sided chest pain, shortness of breath  CHEST - 2 VIEW  Comparison: None.  Findings: No mediastinal silhouette is unremarkable.  No acute infiltrate or pulmonary edema.  Dual lead cardiac pacemaker with leads in the right atrium and right ventricle.  Bony thorax is unremarkable.  IMPRESSION: No active disease.   Original Report Authenticated By: Natasha Mead, M.D.    Ct Head Wo Contrast  05/31/2012     IMPRESSION: No evidence of acute intracranial abnormality.  Nonspecific white matter hypodensities- may represent chronic small vessel white matter ischemic changes.  Remote right basal ganglia and left thalamic infarcts.   Original Report Authenticated By: Rosendo Gros, M.D.    Ct Angio Neck W/cm &/or Wo/cm  05/31/2012  IMPRESSION: 1.  No intracranial stenosis or major circle Willis branch occlusion. 2.  Ectatic anterior communicating artery.  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 Imaging surveillance of this finding is suggested, such as biannual intracranial CTA (which could then be discontinued or spaced further if this finding is found to be stable over several years). 3.  Stable CT appearance of the brain; chronic small vessel ischemia.   Original Report Authenticated By: Harley Hallmark, M.D.     Carotid Dopplers 06/01/12 Preliminary report: There is no ovbvious ICA stenosis. Vertebral artery flow is antegrade.  Echo 06/01/12 Left ventricle: The cavity size was normal. Wall thickness was increased in a pattern of mild LVH. The estimated ejection fraction was 55%. Although no diagnostic regional wall motion abnormality was identified, this possibility cannot be completely excluded on the basis of this study. Doppler parameters are consistent with abnormal left ventricular relaxation (grade 1 diastolic dysfunction).      Assessment  Pt is a 56 y.o. patient with significant PMHx for multiple CVA's, HTN, tobacco admitted with left sided weakness concerning for an acute stroke   Plan:  1. Left sided Hemiparesis/CVA small vessel infarct/Anterior communication aneurysm - Concern for new aneurysm on CTA head/neck but no evidence of intracranial hemorrhage  1) A1C 6.0, Lipid profile < 200.  2) Echo does not show thrombus/systolic CHF, carotid dopplers which do not show ICA stenosis or retrograde flow, PT/OT speech checks.    3)  Continues to have HA, will switch to oxycodone for limited supply on d/c.  Spoke with neurosurg who took a look at the Ct and CTA and states there is a low threshold for [facility].  If considering still, will consult IR for LP to look for xanthochromia.    4) Will continue with plavix per neurology.  75 mg qd, most likely lifelong   5) Continue telemetry and neuro checks q 4 hrs.   2. Chest Pain-Wells criteria 0. TIMI risk score of 2. Cardiac Markers negative x 3, going for Echo today along with carotid dopplers.   1) Telemetry overnight did not show anything. Repeat AM EKG pending   2) Could be GI related and started on Protonix 40 mg PO  3) Continue Plavix, consider restarting ASA at 81 mg.   4) Consider outpt stress test upon d/c  3. Hyperlipidemia, Lipid Profile shows Chol of 172 and LDL of 70   1) Continue Zocor 20 mg  4. Grade One Diastolic CHF - Echo shows normal EF with slight decrease in ventricular relaxation  1) No evidence of edema or JVD on exam  2) Will consider  BB on d/c   5. Tobacco abuse - Counseling given, consider CSW consult.  1) Nicoderm patch while inpt   6. HTN - Pt BP has been stable, will restart home meds today before d/c.   7. Sick Sinus Syndrome - Pacemaker in place, tele continuous    FEN/GI: heart healthy, [place] Lock Prophylaxis:  SCD's , Protonix  Disposition: Pending neurology recommendations, d/c home with close f/u and outpt testing  Code Status: Full Code   Norvell Ureste R. Taaj Hurlbut DO, FMTS PGY-1

## 2012-06-02 NOTE — Progress Notes (Signed)
PGY-1 Update Note  Called by nursing ~0500. Pt had gotten up to go to the bathroom without calling for assistance and states he fell, "half-catching" himself and landing on his right hip. Denies striking his head or loss of consciousness. Examined, no notable bleeding or bruising to hip or elbow, lucid and appropriate to conversation, cooperative.  Will continue current management and discuss with primary team this morning. Bobbye Morton, MD PGY-1, [facility] Family Medicine FPTS Intern pager: [phone]

## 2012-06-02 NOTE — Progress Notes (Signed)
Clinical Child psychotherapist (CSW) received a referral to assist pt in finding a PCP. CSW informed FMTS of referral needing to be for [facility]. CSW to inform RNCM. CSW signing off but available if any CSW needs arise.  Theresia Bough, MSW, Theresia Majors [phone]

## 2012-06-02 NOTE — Evaluation (Addendum)
Occupational Therapy Evaluation Patient Details Name: Dennis Brock MRN: [id number] DOB: 05/22/1956 Today's Date: 06/02/2012 Time: 4098-1191 OT Time Calculation (min): 14 min  OT Assessment / Plan / Recommendation Clinical Impression  Patient got up to use bathroom 2 am last night and noted head hurt very bad behind right ear and over left eye. He felt in his normal state of health when he went to bed at 10 or 11.  Patient got dizzy, had double vision during this time.Also noted worsening left leg and arm weakness as well as worsening numbness in left arm. Pt currently is independent with ADL tasks and has some residual L shoulder weakness from stroke 2 months back. Issued theraband exercises and do not feel pt needs any further OT services.     OT Assessment  Patient does not need any further OT services    Follow Up Recommendations  No OT follow up;Supervision - Intermittent    Barriers to Discharge      Equipment Recommendations  None recommended by OT    Recommendations for Other Services    Frequency       Precautions / Restrictions Precautions Precaution Comments: had a fall earlier this morning but no observed balance issues during OT eval Restrictions Weight Bearing Restrictions: No        ADL  Eating/Feeding: Simulated;Independent Where Assessed - Eating/Feeding: Edge of bed Grooming: Simulated;Wash/dry hands;Independent Where Assessed - Grooming: Unsupported standing Upper Body Bathing: Simulated;Independent Where Assessed - Upper Body Bathing: Unsupported standing Lower Body Bathing: Simulated;Independent Where Assessed - Lower Body Bathing: Unsupported standing;Other (comment) (seated to wash feet) Upper Body Dressing: Simulated;Independent Where Assessed - Upper Body Dressing: Unsupported sitting Lower Body Dressing: Simulated;Independent Where Assessed - Lower Body Dressing: Unsupported sit to stand Toilet Transfer: Performed;Independent Toilet Transfer  Method: Other (comment) (transfer into bathroom without device) Toilet Transfer Equipment: Regular height toilet Toileting - Clothing Manipulation and Hygiene: Simulated;Independent Where Assessed - Toileting Clothing Manipulation and Hygiene: Sit to stand from 3-in-1 or toilet ADL Comments: Pt able to reach to floor X2 to pick up object without LOB. States his fall this am was related to getting dizzy and tripping coming out of bathroom. He is aware to go slow with activity and take caution. Issued theraband for UE exercises and demonstrated as L UE is slightly weaker compared to R due to stroke several months ago. Pt able to return demo exercises with orange and green theraband. Instructed him to gradually increase reps and start with orange band and progress to green as tolerated.  Reports an episode of diplopia yesterday but none today.    OT Diagnosis:    OT Problem List:   OT Treatment Interventions:     OT Goals    Visit Information  Last OT Received On: 06/02/12 Assistance Needed: +1    Subjective Data  Subjective: I am fine Patient Stated Goal: none stated but agreeable to let OT assess   Prior Functioning     Home Living Lives With: Family Available Help at Discharge: Available 24 hours/day;Other (Comment);Family (states sister runs daycare in house-someone always home) Type of Home: House Home Access: Ramped entrance Home Layout: One level Bathroom Shower/Tub: Other (comment) (sponges) Bathroom Toilet: Standard (vanity beside) Home Adaptive Equipment: Straight cane Prior Function Level of Independence: Independent Driving: Yes Vocation: On disability Communication Communication: No difficulties         Vision/Perception     Cognition  Overall Cognitive Status: Appears within functional limits for tasks assessed/performed Arousal/Alertness: Awake/alert  Orientation Level: Appears intact for tasks assessed Behavior During Session: [facility] for tasks performed     Extremity/Trunk Assessment Right Upper Extremity Assessment RUE ROM/Strength/Tone: [facility] for tasks assessed (states he does have bilateral rotator cuff tears) Left Upper Extremity Assessment LUE ROM/Strength/Tone: Deficits LUE ROM/Strength/Tone Deficits: states history of rotator cuff tear. strength in shoulder grossly 4/5 compared to R.     Mobility Bed Mobility Bed Mobility: Left Sidelying to Sit Left Sidelying to Sit: 6: Modified independent (Device/Increase time);HOB elevated;With rails Transfers Transfers: Sit to Stand;Stand to Sit Sit to Stand: 7: Independent;From bed;From toilet Stand to Sit: 7: Independent;To bed;To toilet     Shoulder Instructions     Exercise     Balance Balance Balance Assessed: Yes Dynamic Standing Balance Dynamic Standing - Level of Assistance: 7: Independent;Other (comment) (able to make turns and reach down to floor without LOB)   End of Session OT - End of Session Activity Tolerance: Patient tolerated treatment well Patient left: in bed;with call bell/phone within reach  GO     Lennox Laity [phone] 06/02/2012, 10:25 AM

## 2012-06-02 NOTE — Progress Notes (Signed)
Stroke Team Progress Note  HISTORY  Dennis Brock is an 56 y.o. male who LKN 2100 yesterday 05/30/12, woke up to urinate 0200 today and had a headache. Went back to sleep and awoke again with headache which worsened and at 1300 he began having dizziness "room spinning", he has sensitivity to light in his right eye. He later states that his left sided weakness started 2 days ago. He states that this is just like the sx that he experienced 2 mos ago when he was admitted with a stroke at an outlying hospital. No MRI due to PPM. He also stated that he was having some chest pain earlier today with shortness of breath which has now cleared.   Current headache location: left temporal "sharpe", right posterior auricular. He is wanting medication for pain control.   2 mos ago when he was admitted for stroke he stated that he had same sx, was admitted, had generalized weakness which resolved and he had no residual problems. He was told that he had experienced 2 strokes. He was sent home at that time on a baby aspirin. CT today confirms remote right basal ganglia and left thalamic infarcts.    LSN: 2100 on 05/30/2012 (of headache), left sided weakness "2 days ago"  tPA Given: No: out of window, reported recent stroke 2 mos ago at outlying facility   SUBJECTIVE He is feeling better today, less dizzy.  OBJECTIVE Most recent Vital Signs: Filed Vitals:   06/01/12 2000 06/02/12 0000 06/02/12 0400 06/02/12 0430  BP: 131/88 153/109 159/93 138/87  Pulse: 65 63 57 69  Temp: 98 F (36.7 C) 97.8 F (36.6 C) 97.7 F (36.5 C)   TempSrc:      Resp: 20 18 18    Height:      Weight:      SpO2: 94% 95% 95%    CBG (last 3)   Basename 06/02/12 0755 06/01/12 2103 05/31/12 1429  GLUCAP 118* 101* 97   MEDICATIONS     . clopidogrel  75 mg Oral Q breakfast  . lisinopril  10 mg Oral Daily   And  . hydrochlorothiazide  12.5 mg Oral Daily  . influenza  inactive virus vaccine  0.5 mL Intramuscular  Tomorrow-1000  . metoprolol succinate  12.5 mg Oral Daily  . nicotine  21 mg Transdermal Q24H  . pantoprazole  40 mg Oral Q1200  . pneumococcal 23 valent vaccine  0.5 mL Intramuscular Tomorrow-1000  . simvastatin  20 mg Oral QPM  . DISCONTD: amLODipine  5 mg Oral Daily  . DISCONTD: lisinopril-hydrochlorothiazide  1 tablet Oral Daily  . DISCONTD: nicotine  7 mg Transdermal Daily   PRN:  alum & mag hydroxide-simeth, gabapentin, ondansetron (ZOFRAN) IV, oxyCODONE-acetaminophen, senna-docusate, DISCONTD: methadone, DISCONTD:  morphine injection  Diet:  Cardiac thin liquids Activity:  ambulate DVT Prophylaxis: SCDs ordered  CLINICALLY SIGNIFICANT STUDIES Basic Metabolic Panel:   Lab 05/31/12 2234 05/31/12 1430  NA -- 137  K -- 3.9  CL -- 100  CO2 -- 28  GLUCOSE -- 106*  BUN -- 12  CREATININE 1.30 1.28  CALCIUM -- 9.9  MG -- --  PHOS -- --    CBC:   Lab 05/31/12 2234 05/31/12 1430  WBC 5.2 5.1  NEUTROABS -- --  HGB 15.1 15.3  HCT 43.3 42.9  MCV 86.4 85.8  PLT 184 189   Coagulation:   Lab 05/31/12 1430  LABPROT 11.7  INR 0.86   Cardiac Enzymes:   Lab 06/01/12 4098  05/31/12 2234 05/31/12 1430  CKTOTAL -- -- --  CKMB -- -- --  CKMBINDEX -- -- --  TROPONINI <0.30 <0.30 <0.30   Lipid Panel    Component Value Date/Time   CHOL 174 06/01/2012 0615   TRIG 272* 06/01/2012 0615   HDL 50 06/01/2012 0615   CHOLHDL 3.5 06/01/2012 0615   VLDL 54* 06/01/2012 0615   LDLCALC 70 06/01/2012 0615   HgbA1C  Lab Results  Component Value Date   HGBA1C 6.0* 06/01/2012   Ct Angio Head W/cm &/or Wo Cm 05/31/2012  Negative neck CTA.  Dominant left vertebral artery. No intracranial stenosis or major circle Willis branch occlusion. Ectatic anterior communicating artery.  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 Imaging surveillance of this finding is suggested, such as biannual intracranial CTA (which could then be discontinued or spaced  further if this finding is found to be stable over several years). Stable CT appearance of the brain; chronic small vessel ischemia.     Dg Chest 2 View 05/31/2012 No active disease.   Original Report Authenticated By: Natasha Mead, M.D.    Ct Head Wo Contrast 05/31/2012   No evidence of acute intracranial abnormality.  Nonspecific white matter hypodensities- may represent chronic small vessel white matter ischemic changes.  Remote right basal ganglia and left thalamic infarcts.     2D Echocardiogram Normal LV size with mild LV hypertrophy, EF 55%. Mildly dilated aortic root to 4.1 cm. Normal RV size and systolic function. No significant valvular abnormalities  Carotid Doppler  No evidence of hemodynamically significant internal carotid artery stenosis. Vertebral artery flow is antegrade.   EKG  sinus bradycardia.   Therapy Recommendations PT - none; OT - none   Physical Exam  Obese middle aged [place] male not in distress.Awake alert. Afebrile. Head is nontraumatic. Neck is supple without bruit. Hearing is normal. Cardiac exam no murmur or gallop. Lungs are clear to auscultation. Distal pulses are well felt.  Neurological Exam : Awake alert oriented x 3 normal speech and language.Fundi not visualized. Vision fields and acuity seem normal. Mild saccadic dysmetria to left. Mild left lower face asymmetry. Tongue midline. No drift. Mild diminished fine finger movements on left. Orbits right over left upper extremity. Mild left grip weak.. Normal sensation . Normal coordination. Mild truncal ataxia. Gait deferred.  ASSESSMENT Mr. Dennis Brock is a 56 y.o. male presenting with left sided hemiparesis, frontal headache. Imaging confirms a remote right basal ganglia and left thalamic infarct. No dissection identified. Patient still does have symptoms and horizontal blurred vision and is felt to have a small vessel infarct secondary to small vessel disease as a migraine type picture should not  prolong in this manner.  Work up completed. On aspirin 81 mg orally every day prior to admission. Now on clopidogrel 75 mg orally every day for secondary stroke prevention. No therapy needs per PT and OT evals.   Suspect small vessel stroke due to persistent symptoms  Previous right basal ganglia and left thalamic infarct (2 mos ago)  Hypertension  Hyperlipidemia  Tobacco Abuse  Hospital day # 2  TREATMENT/PLAN  Continue clopidogrel 75 mg orally every day for secondary stroke prevention.  lovenox for VTE prophy until discharge  Ongoing risk factor modification  Ok for discharge from neuro standpoint.  Follow up Dr. Pearlean Brownie in 2 mos, stroke clinic, in his office  SHARON BIBY, MSN, RN, ANVP-BC, ANP-BC, GNP-BC [facility] Pager: [phone]  06/02/2012 9:38 AM  Scribe for Dr. Delia Heady, Stroke Center Medical Director, who has personally reviewed chart, pertinent data, examined the patient and developed the plan of care. Pager:  [phone]

## 2012-06-02 NOTE — Progress Notes (Signed)
Pt called staff to room. Upon entering the room pt stated "i tripped in the bathroom over the unevenness at the door". Pt was sitting back in bed. Pt denies feeling any dizziness prior to fall. Pt alert and oriented, refused bed alarm earlier in shift and agreed to call. Vital signs taken and stable see doc flow sheet. MD notified and no new orders obtained at this time. Pt still refuses bed alarm after fall. Will continue to monitor.

## 2012-06-02 NOTE — Progress Notes (Signed)
FMTS Attending Daily Note:  Jeff Kosha Jaquith MD  [phone] pager  Family Practice pager:  [phone] I have reviewed this patient and have reviewed their chart. I have discussed this patient with the resident. I agree with the resident's findings, assessment and care plan.   

## 2012-06-02 NOTE — Evaluation (Signed)
Physical Therapy Evaluation Patient Details Name: Dennis Brock MRN: [id number] DOB: Sep 16, 1955 Today's Date: 06/02/2012 Time: 4098-1191 PT Time Calculation (min): 28 min  PT Assessment / Plan / Recommendation Clinical Impression  pt admitted with L sided weakness which has largely resolved.  Some mild gaze stability problems remain, but pt can compensate fairly well for this presently.  Gaze stability exercises given.  No further PT needs    PT Assessment  Patent does not need any further PT services    Follow Up Recommendations  No PT follow up    Barriers to Discharge        Equipment Recommendations  None recommended by PT    Recommendations for Other Services     Frequency      Precautions / Restrictions Precautions Precautions: Fall Precaution Comments:   Restrictions Weight Bearing Restrictions: No   Pertinent Vitals/Pain       Mobility  Bed Mobility Bed Mobility: Not assessed Left Sidelying to Sit: 6: Modified independent (Device/Increase time);HOB elevated;With rails Transfers Transfers: Sit to Stand;Stand to Sit Sit to Stand: 7: Independent;From bed;From chair/3-in-1;From toilet Stand to Sit: 7: Independent;To bed;To toilet Ambulation/Gait Ambulation/Gait Assistance: 7: Independent Ambulation Distance (Feet): 500 Feet Assistive device: None Ambulation/Gait Assistance Details: generally steady (see DGI) Gait Pattern: Within Functional Limits Stairs: Yes Stairs Assistance: 5: Supervision Stair Management Technique: One rail Right;Alternating pattern;Forwards Number of Stairs: 7  Modified Rankin (Stroke Patients Only) Pre-Morbid Rankin Score: No symptoms Modified Rankin: No significant disability    Shoulder Instructions     Exercises     PT Diagnosis:    PT Problem List:   PT Treatment Interventions:     PT Goals    Visit Information  Last PT Received On: 06/02/12 Assistance Needed: +1    Subjective Data  Subjective: I just feel  dizzy when I move my head and look around Patient Stated Goal: HOme I   Prior Functioning  Home Living Lives With: Family Available Help at Discharge: Available 24 hours/day;Other (Comment);Family Type of Home: House Home Access: Ramped entrance Home Layout: One level Bathroom Shower/Tub: Other (comment) Bathroom Toilet: Standard Home Adaptive Equipment: Straight cane Prior Function Level of Independence: Independent Driving: Yes Vocation: On disability Communication Communication: No difficulties    Cognition  Overall Cognitive Status: Appears within functional limits for tasks assessed/performed Arousal/Alertness: Awake/alert Orientation Level: Appears intact for tasks assessed Behavior During Session: [facility] for tasks performed    Extremity/Trunk Assessment Right Upper Extremity Assessment RUE ROM/Strength/Tone: [facility] for tasks assessed (states he does have bilateral rotator cuff tears) Left Upper Extremity Assessment LUE ROM/Strength/Tone: Deficits LUE ROM/Strength/Tone Deficits: states history of rotator cuff tear. strength in shoulder grossly 4/5 compared to R. Right Lower Extremity Assessment RLE ROM/Strength/Tone: Within functional levels Left Lower Extremity Assessment LLE ROM/Strength/Tone: Within functional levels Trunk Assessment Trunk Assessment: Normal   Balance Balance Balance Assessed: Yes Dynamic Standing Balance Dynamic Standing - Level of Assistance: 7: Independent;Other (comment) Standardized Balance Assessment Standardized Balance Assessment: Dynamic Gait Index Dynamic Gait Index Level Surface: Normal Change in Gait Speed: Normal Gait with Horizontal Head Turns: Normal Gait with Vertical Head Turns: Mild Impairment Gait and Pivot Turn: Normal Step Over Obstacle: Normal Step Around Obstacles: Normal Steps: Normal Total Score: 23   End of Session PT - End of Session Activity Tolerance: Patient tolerated treatment well Patient left: in bed;with call  bell/phone within reach;Other (comment) (sitting EOB) Nurse Communication: Mobility status  GP     Saia Derossett, Eliseo Gum 06/02/2012, 12:08 PM  06/02/2012  Bellewood Bing, PT [phone] (pager)

## 2012-06-02 NOTE — Evaluation (Signed)
Speech Language Pathology Evaluation Patient Details Name: Dennis Brock MRN: [id number] DOB: 1955-12-10 Today's Date: 06/02/2012 Time: 1021-1039 SLP Time Calculation (min): 18 min  Problem List:  Patient Active Problem List  Diagnosis  . Hypertension  . Stroke  . Hyperlipidemia  . Tobacco abuse  . Sick sinus syndrome  . GERD (gastroesophageal reflux disease)  . Chronic pain syndrome   Past Medical History:  Past Medical History  Diagnosis Date  . Hypertension     amlodipine, lisinopril  . Stroke     was on aspirin before stroke. July 2012 in [place].  sent home aspirin.   Marland Kitchen Hyperlipidemia     zocor 40mg   . Tobacco abuse     14-15 pack years at 2 packs per week  . Sick sinus syndrome     s/p pacemaker, no defibrillator  . GERD (gastroesophageal reflux disease)   . Chronic pain syndrome     on methadone and percocet   Past Surgical History:  Past Surgical History  Procedure Date  . Pacemaker insertion   . Back surgery     5 back surgeries  . Hand surgery     3 broken bones  . Bunionectomy     right foot   HPI:  Dennis Brock is a 56 y.o. year old male  with a history tobacco abuse, hypertension, hyperlipidemia, and of stroke on Aspirin presenting with worsening left sided weakness likely due to recurrent stroke. CT negative for acute infact showing only remote left basal ganglia and left thalamic infarcts.    Assessment / Plan / Recommendation Clinical Impression  Cognitive-linguistic function appears at baseline and WFL at this time. No further acute SLP needs indicated at this time. Signing off. Please reconsult as needed.     SLP Assessment  Patient does not need any further Speech Lanaguage Pathology Services    Follow Up Recommendations  None       Pertinent Vitals/Pain n/a    SLP Evaluation Prior Functioning  Type of Home: House Lives With: Family Available Help at Discharge: Available 24 hours/day;Other (Comment);Family (states sister runs  daycare in house-someone always home) Vocation: On disability   Cognition  Overall Cognitive Status: Appears within functional limits for tasks assessed Arousal/Alertness: Awake/alert Orientation Level: Oriented X4    Comprehension  Auditory Comprehension Overall Auditory Comprehension: Appears within functional limits for tasks assessed Visual Recognition/Discrimination Discrimination: Within Function Limits Reading Comprehension Reading Status: Within funtional limits    Expression Expression Primary Mode of Expression: Verbal Verbal Expression Overall Verbal Expression: Appears within functional limits for tasks assessed   Oral / Motor Oral Motor/Sensory Function Overall Oral Motor/Sensory Function: Appears within functional limits for tasks assessed Motor Speech Overall Motor Speech: Appears within functional limits for tasks assessed   GO   Dennis Lango MA, CCC-SLP [phone]   Dennis Brock 06/02/2012, 10:44 AM

## 2012-06-02 NOTE — Discharge Summary (Signed)
Physician Discharge Summary  Patient ID: Dennis Brock MRN: [id number] DOB: May 22, 1956 Age: 56 y.o.  Admit date: 05/31/2012 Discharge date: 06/02/2012 Admitting Physician: Tobey Grim, MD  PCP: Provider Not In System  Consultants:Neurology, Neurosurgery      Discharge Diagnosis: Principal Problem:  *Stroke Active Problems:  Hypertension  Hyperlipidemia  Tobacco abuse  Sick sinus syndrome    Hospital Course Pt is a 56 y.o. patient with significant PMHx for multiple CVA's, HTN, tobacco admitted with left sided hemiparesis concerning for an acute CVA.    Plan:  1. Left sided Hemiparesis/CVA small vessel infarct/Anterior communication aneurysm - Upon presentation, pt stated he was having increasing weakness on the left side along with changes in his vision.  Pt presented to the ED and a CT was performed showing residual infarcts of the right basal ganglia/left thalmic areas.  Neurology was consulted due to the presentation and they requested a CTA, since pt has a pacer could not get a MRA, which showed a possible developing aneurysm in the anterior communicating artery.  At this point, a full stroke work up was initiated as well as continuing his ASA and starting him on Plavix.  His A1C was 6.0, Lipid Profile < 200 with LDL at 70, an echo which showed grade one diastolic CHF but no thrombus, as well as a Carotid Doppler which was unremarkable.  PT/OT/Speech were all consulted with no further recommendations.  Upon d/c, pt was back to his baseline which is a 4/5 MS on the Left side UE and LE, with no other evidence of previous CVA.  He will need Plavix 75 mg qd until further directed.    2. Chest Pain-Wells criteria 0. TIMI risk score of 2. Cardiac Markers negative x 3.  EKG was NSR with no abnormalities.  Most likely related to GERD as his Sx improved upon protonix.     3. Hyperlipidemia, Lipid Profile shows Chol of 172 and LDL of 70. Continued home zocor 20 mg.   4. Grade One  Diastolic CHF - Echo shows normal EF with slight decrease in ventricular relaxation.  Will d/c on Toprol XR 12.5 mg  5. HTN - Pt was normotensive on admission.  His BP medications were held for 24 hours and restarted his Zestoretic.  He was also started on the Toprol XR and his previous amlodipine was stopped.  His BP before d/c was 132/91.   6. Tobacco abuse - Nicoderm patch during his stay.  Counseling given and social work presented pt with information on quitting  7. Sick Sinus Syndrome - Pacemaker in place, no events on telemetry, stable on hospital stay.   8. 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  He was started on morphine PRN and changed to methadone after informing the team he was on this previously.  However, he has not been getting methadone for the last two months and he was switched to percocet 5-325 mg every 6 hours as needed.  He will be sent home on a one week Rx for this.          Discharge PE   Filed Vitals:   06/02/12 0900  BP: 132/91  Pulse: 70  Temp: 97.8 F (36.6 C)  Resp: 20   Gen: NAD  HEENT: moist mucous membranes, [place]/AT, PERRLA  CV: Regular rate and rhythm, no murmurs rubs or gallops  PULM: clear to auscultation bilaterally.  No wheezes/rales/rhonchi  ABD: soft/nontender/nondistended/normal bowel sounds  EXT: No edema, pulses +2 DP and Posterior tibial B/L  Neuro: Alert and oriented x3, CN 2-12 intact, sensation intact, dose have Left UE 4/5 MS, Otherwise 5/5 MS throughout       Procedures/Imaging:  Ct Angio Head W/cm &/or Wo Cm  05/31/2012 IMPRESSION:  1.  Negative neck CTA.  Dominant left vertebral artery. 2.  See intracranial findings below.  CTA HEAD  Findings:  Chronic-appearing right corona radiata to external capsule lacunar type infarct.  No ventriculomegaly.  Chronic- appearing left lateral thalamus lacunar type infarct. No midline shift, mass effect, or evidence of  mass lesion.  No acute intracranial hemorrhage identified.  Occasional patchy subcortical white matter hypodensity is stable. No evidence of cortically based acute infarction identified.  No abnormal enhancement identified. Calvarium intact. Visualized scalp soft tissues are within normal limits.  Vascular Findings: Major intracranial venous structures are enhancing.  Normal distal vertebral arteries, the left is dominant.  Normal left PICA.  Normal right PICA.  Normal vertebrobasilar junction. Patent AICAs.  No basilar artery stenosis.  SCA and PCA origins are within normal limits.  Posterior communicating arteries are diminutive or absent.  Bilateral PCA branches are within normal limits.  Both ICA siphons are patent with only minimal calcified atherosclerosis.  No ICA siphon stenosis.      IMPRESSION: 1.  No intracranial stenosis or major circle Willis branch occlusion. 2.  Ectatic anterior communicating artery.  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 Imaging surveillance of this finding is suggested, such as biannual intracranial CTA (which could then be discontinued or spaced further if this finding is found to be stable over several years). 3.  Stable CT appearance of the brain; chronic small vessel ischemia.   Original Report Authenticated By: Harley Hallmark, M.D.    Dg Chest 2 View  05/31/2012  *RADIOLOGY REPORT*  Clinical Data: Left-sided chest pain, shortness of breath  CHEST - 2 VIEW  Comparison: None.  Findings: No mediastinal silhouette is unremarkable.  No acute infiltrate or pulmonary edema.  Dual lead cardiac pacemaker with leads in the right atrium and right ventricle.  Bony thorax is unremarkable.  IMPRESSION: No active disease.   Original Report Authenticated By: Natasha Mead, M.D.    Labs  CBC  Lab 05/31/12 2234 05/31/12 1430  WBC 5.2 5.1  HGB 15.1 15.3  HCT 43.3 42.9  PLT 184 189   BMET  Lab 05/31/12 2234 05/31/12 1430  NA  -- 137  K -- 3.9  CL -- 100  CO2 -- 28  BUN -- 12  CREATININE 1.30 1.28  CALCIUM -- 9.9  PROT -- --  BILITOT -- --  ALKPHOS -- --  ALT -- --  AST -- --  GLUCOSE -- 106*   LIPID PANEL     Status: Abnormal   Collection Time   06/01/12  6:15 AM      Component Value Range Comment   Cholesterol 174  0 - 200 mg/dL    Triglycerides 409 (*) <150 mg/dL    HDL 50  >81 mg/dL    Total CHOL/HDL Ratio 3.5      VLDL 54 (*) 0 - 40 mg/dL    LDL Cholesterol 70  0 - 99 mg/dL        Patient condition at time of discharge/disposition: stable  Disposition-home   Follow up issues: 1. Residual CVA effects.  Should be on Plavix  75 mg and follow up with Neurology  2. Chronic Pain - Possibly setting pt up with methadone clinic. 3. HTN - Switched to Toprol XL 12.5 along with Zestoretic while stopping amlodipine.   Discharge follow up:   Follow-up Information    Please follow up. (Please follow up with your primary care provider that Case Management has found for you within the next week. )         Discharge Orders    Future Orders Please Complete By Expires   Diet - low sodium heart healthy      No wound care      Call MD for:  persistant dizziness or light-headedness      Call MD for:  extreme fatigue      Call MD for:  difficulty breathing, headache or visual disturbances          Discharge Instructions: Please refer to Patient Instructions section of EMR for full details.  Patient was counseled important signs and symptoms that should prompt return to medical care, changes in medications, dietary instructions, activity restrictions, and follow up appointments.  Significant instructions noted below:     Discharge Medications   Medication List     As of 06/02/2012 12:39 PM    START taking these medications         clopidogrel 75 MG tablet   Commonly known as: PLAVIX   Take 1 tablet (75 mg total) by mouth daily with breakfast.      metoprolol succinate 12.5 mg Tb24   Commonly  known as: TOPROL-XL   Take 0.5 tablets (12.5 mg total) by mouth daily.      oxyCODONE-acetaminophen 5-325 MG per tablet   Commonly known as: PERCOCET/ROXICET   Take 1 tablet by mouth every 6 (six) hours as needed.      CONTINUE taking these medications         aspirin 81 MG chewable tablet      lisinopril-hydrochlorothiazide 10-12.5 MG per tablet   Commonly known as: PRINZIDE,ZESTORETIC   Take 1 tablet by mouth daily.      naproxen sodium 220 MG tablet   Commonly known as: ANAPROX      ranitidine 150 MG tablet   Commonly known as: ZANTAC      simvastatin 20 MG tablet   Commonly known as: ZOCOR      STOP taking these medications         amLODipine 10 MG tablet   Commonly known as: NORVASC      methadone 10 MG tablet   Commonly known as: DOLOPHINE          Where to get your medications    These are the prescriptions that you need to pick up. We sent them to a specific pharmacy, so you will need to go there to get them.   [facility] [address], [place] - [address]    [address] [place] [address]    Phone: [phone]        clopidogrel 75 MG tablet   metoprolol succinate 12.5 mg Tb24         You may get these medications from any pharmacy.         oxyCODONE-acetaminophen 5-325 MG per tablet                Gildardo Cranker, DO of [facility] 06/02/2012 12:16 PM

## 2012-06-02 NOTE — Discharge Summary (Signed)
Family Medicine Teaching Service  Discharge Note : Attending Jeff Rainy Rothman MD Pager [phone] Inpatient Team Pager:  [phone]  I have reviewed this patient, reviewed their chart and discussed discharge planning with the resident at the time of discharge. I agree with the discharge plan as above.  

## 2012-06-08 ENCOUNTER — Other Ambulatory Visit: Payer: Self-pay | Admitting: Family Medicine

## 2012-06-08 MED ORDER — SIMVASTATIN 20 MG PO TABS: 20.0000 mg | ORAL_TABLET | Freq: Every evening | ORAL | Status: DC

## 2012-06-08 NOTE — Telephone Encounter (Signed)
Received call from floor nurse. Pt called unit [address] requesting med refill. Pt was recently discharged from our service (see D/C summ by Dr. Paulina Fusi cosigned by Dr. Gwendolyn Grant). States he does not yet have a new PCP and is not a current pt of [facility] (pt is a recent move from [place]) but his Zocor 20 mg was not called into his pharmacy at time of discharge with his other medications. Reviewed chart, will call Zocor 20 into his pharmacy. Pt to establish care with a local PCP, was given a list of local clinics by care management prior to discharge. --CMS

## 2012-10-28 ENCOUNTER — Emergency Department
Admission: EM | Admit: 2012-10-28 | Disposition: A | Discharge status: Home / Self Care | Payer: Self-pay | Source: Ambulatory Visit | Attending: Student in an Organized Health Care Education/Training Program | Admitting: Student in an Organized Health Care Education/Training Program

## 2012-10-28 NOTE — ED Notes (Signed)
Right knee swelling from getting off the bus one week ago, slipped hurting right knee.

## 2012-10-28 NOTE — First Provider Contact (Signed)
ED Medical Screening Exam Note    Initial provider evaluation performed by   ED First Provider Contact    Date/Time Event User Comments    10/28/12 2127 ED Provider First Contact Aurianna Earlywine A Initial Face to Face Provider Contact                XRAYS ordered.  Right knee swollen and painful after remote trauma a week ago; patient otherwise denies fever hx, trauma, headache, neck pain, chest pain, shortness of breath, palpitations, syncope, lightheadedness, confusion, abdominal pain, NVD, UTI/GIB symptoms, pelvic pain, back pain, extremity symptoms (other than what is listed in the HPI) or neurologic symptoms.        Patient requires further evaluation.     Theresia Lo, MD, 10/28/2012, 9:27 PM

## 2012-10-28 NOTE — ED Notes (Signed)
Pt sitting in wheel chair in NAD.

## 2012-10-29 ENCOUNTER — Encounter: Payer: Self-pay | Admitting: Emergency Medicine

## 2012-10-29 MED ORDER — OXYCODONE-ACETAMINOPHEN 5-325 MG PO TABS *I*
1.0000 | ORAL_TABLET | ORAL | Status: AC | PRN
Start: 2012-10-29 — End: 2012-11-01

## 2012-10-29 MED ORDER — IBUPROFEN 600 MG PO TABS *I*
600.0000 mg | ORAL_TABLET | Freq: Three times a day (TID) | ORAL | Status: AC | PRN
Start: 2012-10-29 — End: 2012-11-08

## 2012-10-29 MED ORDER — OXYCODONE-ACETAMINOPHEN 5-325 MG PO TABS *I*
2.0000 | ORAL_TABLET | Freq: Once | ORAL | Status: AC
Start: 2012-10-29 — End: 2012-10-29
  Administered 2012-10-29: 2 via ORAL
  Filled 2012-10-29: qty 2

## 2012-10-29 NOTE — ED Provider Progress Notes (Addendum)
ED Provider Progress Note     Patient amendable to going home, will be discharged with pain control and NSAIDS. He will follow up with his primary care doctor.       Weston Settle, MD, 10/29/2012, 2:44 AM

## 2012-10-29 NOTE — ED Provider Notes (Addendum)
History     Chief Complaint   Patient presents with   . Knee Injury     HPI Comments: 57 yo male with HTN, chronic back pain, presents with right knee pain and swelling after slipping on ice, but he did not fall and it did not hurt initially. He has been able to walk on it since then. He has also been out of his inhaler, and needs a prescription. There has been no redness, but it was bruised at one point. It did feel warm at one point.     No fevers or chills. No other injuries.       History provided by:  Patient      Past Medical History   Diagnosis Date   . Hypertension    . COPD (chronic obstructive pulmonary disease)    . Asthma    . GI bleed    . GERD (gastroesophageal reflux disease)    . Back pain    . Pacemaker    . Bradycardia             Past Surgical History   Procedure Laterality Date   . Back surgery       5 times since 1999   . Pacemaker insertion         Family History   Problem Relation Age of Onset   . Coronary art dis Father          Social History      reports that he has been smoking Cigarettes.  He has a 10 pack-year smoking history. He has never used smokeless tobacco. He reports that he currently engages in sexual activity. He reports that he does not drink alcohol or use illicit drugs.    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   HENT: Negative for facial swelling.    Respiratory: Negative for cough and shortness of breath.    Cardiovascular: Negative for chest pain.   Gastrointestinal: Negative for abdominal pain.   Genitourinary: Negative for flank pain.   Musculoskeletal: Positive for joint swelling.   Skin: Negative for rash.   Neurological: Negative for light-headedness and  headaches.   Hematological: Negative for adenopathy.   Psychiatric/Behavioral: Negative for behavioral problems and agitation.       Physical Exam     ED Triage Vitals   BP Heart Rate Heart Rate(via Pulse Ox) Resp Temp Temp Source SpO2 O2 Device O2 Flow Rate   10/28/12 2128 10/28/12 2128 -- 10/28/12 2128 10/28/12 2128 10/28/12 2128 10/28/12 2128 10/28/12 2128 --   130/88 mmHg 89  20 35.4 C (95.7 F) TEMPORAL 95 % None (Room air)       Weight           10/28/12 2128           141.522 kg (312 lb)               Physical Exam   Constitutional: He is oriented to person, place, and time. No distress.   HENT:   Head: Normocephalic.   Mouth/Throat: No  oropharyngeal exudate.   Eyes: Pupils are equal, round, and reactive to light.   Neck: Normal range of motion.   Cardiovascular: Normal rate.    No murmur heard.  Pulmonary/Chest: Effort normal. He has no wheezes.   Abdominal: Soft. There is no tenderness.   Musculoskeletal:   Able to flex and extend with pain  Posteriorly possible baker's cyst   Neurological: He is alert and oriented to person, place, and time.   Intact sensation right lower leg   Skin: Skin is warm. He is not diaphoretic.   No overlying cellulitis of right knee   Psychiatric: He has a normal mood and affect. His behavior is normal.       Medical Decision Making      Amount and/or Complexity of Data Reviewed  Tests in the radiology section of CPT: reviewed and ordered        Initial Evaluation:  ED First Provider Contact    Date/Time Event User Comments    10/28/12 2127 ED Provider First Contact RUECKMANN, ERIK A Initial Face to Face Provider Contact          Patient seen by me on arrival date of 10/29/12 at 0206    Assessment:  57 y.o., male comes to the ED with right knee pain which on exam appears to be a possible Baker's cyst. He is worried that he has gout, but he has no other pain anywhere. The joint can be ranged, so it is unlikely a septic joint. This could also be osteoarthritis as well.      Differential Diagnosis includes   Baker's cyst  Gout- less likely  Osteoarthritis  Septic joint - unlikely             Plan:   1- percocet  2- plain films of the knee show an effusion, but no fracture  3- US of the leg shows no DVT  4- patient will be given symptomatic treatment and discharged to home      Weston Settle, MD    Weston Settle, MD  Resident  10/29/12 0241    Weston Settle, MD  Resident  10/29/12 [phone]    Resident Attestation:     Patient seen by me on arrival date of 10/28/2012     History:   I reviewed this patient, reviewed the resident's note and agree.  Exam:   I examined this patient, reviewed the resident's note and agree.    Decision Making:   I discussed with the resident his/her documented decision making  and agree.      Author Kerby Less, MD      Kerby Less, MD  10/31/12 [phone]

## 2012-10-29 NOTE — Discharge Instructions (Signed)
Please follow up with your doctor at [facility] this week for follow-up of your Baker's cyst.     If you develop any new or worrisome symptoms please call your doctor.     Take the ibpuprofen with food only, and be sure to drink plenty of fluid.     Do not drive while taking the percocet.

## 2012-10-29 NOTE — ED Notes (Signed)
Pt with baker's cyst on R knee. Hx of mi, cva's , states is able to get around. Friend may come to get him or he may wait for a bus.

## 2013-09-09 ENCOUNTER — Inpatient Hospital Stay
Admission: EM | Admit: 2013-09-09 | Disposition: A | Discharge status: Home / Self Care | Payer: Self-pay | Source: Ambulatory Visit | Attending: Internal Medicine | Admitting: Internal Medicine

## 2013-09-09 ENCOUNTER — Other Ambulatory Visit: Payer: Self-pay | Admitting: Gastroenterology

## 2013-09-09 ENCOUNTER — Encounter: Payer: Self-pay | Admitting: Student in an Organized Health Care Education/Training Program

## 2013-09-09 DIAGNOSIS — J441 Chronic obstructive pulmonary disease with (acute) exacerbation: Secondary | ICD-10-CM

## 2013-09-09 DIAGNOSIS — J210 Acute bronchiolitis due to respiratory syncytial virus: Principal | ICD-10-CM | POA: Diagnosis present

## 2013-09-09 HISTORY — DX: Cerebral infarction, unspecified: I63.9

## 2013-09-09 LAB — BASIC METABOLIC PANEL
Anion Gap: 13 (ref 7–16)
CO2: 23 mmol/L (ref 20–28)
Calcium: 8.6 mg/dL (ref 8.6–10.2)
Chloride: 105 mmol/L (ref 96–108)
Creatinine: 2 mg/dL — ABNORMAL HIGH (ref 0.67–1.17)
GFR,Black: 42 * — AB
GFR,Caucasian: 36 * — AB
Glucose: 126 mg/dL — ABNORMAL HIGH (ref 60–99)
Lab: 15 mg/dL (ref 6–20)
Potassium: 3.8 mmol/L (ref 3.3–5.1)
Sodium: 141 mmol/L (ref 133–145)

## 2013-09-09 LAB — CBC AND DIFFERENTIAL
Baso # K/uL: 0 10*3/uL (ref 0.0–0.1)
Basophil %: 0.2 % (ref 0.2–1.2)
Eos # K/uL: 0.1 10*3/uL (ref 0.0–0.5)
Eosinophil %: 0.8 % (ref 0.8–7.0)
Hematocrit: 46 % (ref 40–51)
Hemoglobin: 15.1 g/dL (ref 13.7–17.5)
Lymph # K/uL: 0.8 10*3/uL — ABNORMAL LOW (ref 1.3–3.6)
Lymphocyte %: 8.3 % — ABNORMAL LOW (ref 21.8–53.1)
MCH: 30 pg/cell (ref 26–32)
MCHC: 33 g/dL (ref 32–37)
MCV: 91 fL (ref 79–92)
Mono # K/uL: 0.8 10*3/uL (ref 0.3–0.8)
Monocyte %: 8.6 % (ref 5.3–12.2)
Neut # K/uL: 7.8 10*3/uL — ABNORMAL HIGH (ref 1.8–5.4)
Platelets: 176 10*3/uL (ref 150–330)
RBC: 5 MIL/uL (ref 4.6–6.1)
RDW: 14.5 % — ABNORMAL HIGH (ref 11.6–14.4)
Seg Neut %: 82.1 % — ABNORMAL HIGH (ref 34.0–67.9)
WBC: 9.5 10*3/uL — ABNORMAL HIGH (ref 4.2–9.1)

## 2013-09-09 LAB — URINALYSIS WITH MICROSCOPIC
Hyaline Casts,UA: 6 /lpf — ABNORMAL HIGH (ref 0–2)
Ketones, UA: NEGATIVE
Leuk Esterase,UA: NEGATIVE
Nitrite,UA: NEGATIVE
Protein,UA: NEGATIVE mg/dL
RBC,UA: 2 /hpf (ref 0–2)
Specific Gravity,UA: 1.017 (ref 1.002–1.030)
WBC,UA: 4 /hpf (ref 0–5)
pH,UA: 5 (ref 5.0–8.0)

## 2013-09-09 LAB — CK: CK: 159 U/L (ref 46–171)

## 2013-09-09 LAB — INFLUENZA  A & B/RSV PCR: Influenza A&B/RSV PCR: POSITIVE

## 2013-09-09 LAB — CREATININE, URINE: Creatinine,UR: 244 mg/dL (ref 20–300)

## 2013-09-09 LAB — SODIUM, URINE: Sodium,UR: 50 mmol/L

## 2013-09-09 MED ORDER — IPRATROPIUM-ALBUTEROL 0.5-2.5 MG/3ML IN SOLN *I*
3.0000 mL | Freq: Once | RESPIRATORY_TRACT | Status: AC
Start: 2013-09-09 — End: 2013-09-09
  Administered 2013-09-09: 3 mL via RESPIRATORY_TRACT
  Filled 2013-09-09: qty 3

## 2013-09-09 MED ORDER — BUDESONIDE-FORMOTEROL FUMARATE 80-4.5 MCG/ACT IN AERO *I*
2.0000 | INHALATION_SPRAY | Freq: Two times a day (BID) | RESPIRATORY_TRACT | Status: DC
Start: 2013-09-09 — End: 2013-09-15
  Administered 2013-09-10 – 2013-09-15 (×11): 2 via RESPIRATORY_TRACT
  Filled 2013-09-09: qty 6.9

## 2013-09-09 MED ORDER — PREDNISONE 20 MG PO TABS *I*
60.0000 mg | ORAL_TABLET | Freq: Once | ORAL | Status: AC
Start: 2013-09-09 — End: 2013-09-09
  Administered 2013-09-09: 60 mg via ORAL
  Filled 2013-09-09: qty 3

## 2013-09-09 MED ORDER — MORPHINE SULFATE 4 MG/ML IJ SOLN
4.0000 mg | Freq: Once | INTRAMUSCULAR | Status: AC
Start: 2013-09-09 — End: 2013-09-09
  Administered 2013-09-09: 4 mg via INTRAVENOUS
  Filled 2013-09-09: qty 1

## 2013-09-09 MED ORDER — ACETAMINOPHEN 325 MG PO TABS *I*
650.0000 mg | ORAL_TABLET | ORAL | Status: DC | PRN
Start: 2013-09-09 — End: 2013-09-15
  Administered 2013-09-09 – 2013-09-11 (×6): 650 mg via ORAL
  Filled 2013-09-09 (×7): qty 2

## 2013-09-09 MED ORDER — PREDNISONE 20 MG PO TABS *I*
40.0000 mg | ORAL_TABLET | Freq: Every day | ORAL | Status: DC
Start: 2013-09-09 — End: 2013-09-09

## 2013-09-09 MED ORDER — ALBUTEROL SULFATE (2.5 MG/3ML) 0.083% IN NEBU *I*
2.5000 mg | INHALATION_SOLUTION | RESPIRATORY_TRACT | Status: DC | PRN
Start: 2013-09-09 — End: 2013-09-15
  Administered 2013-09-10 – 2013-09-14 (×7): 2.5 mg via RESPIRATORY_TRACT
  Filled 2013-09-09 (×7): qty 3

## 2013-09-09 MED ORDER — SODIUM CHLORIDE 0.9 % IV SOLN WRAPPED *I*
125.0000 mL/h | Status: DC
Start: 2013-09-09 — End: 2013-09-11
  Administered 2013-09-09 – 2013-09-11 (×6): 125 mL/h via INTRAVENOUS

## 2013-09-09 MED ORDER — ENOXAPARIN SODIUM 40 MG/0.4ML IJ SOSY *I*
40.0000 mg | PREFILLED_SYRINGE | Freq: Two times a day (BID) | INTRAMUSCULAR | Status: DC
Start: 2013-09-09 — End: 2013-09-15
  Administered 2013-09-09 – 2013-09-15 (×11): 40 mg via SUBCUTANEOUS
  Filled 2013-09-09 (×13): qty 0.4

## 2013-09-09 MED ORDER — ONDANSETRON HCL 2 MG/ML IV SOLN *I*
4.0000 mg | Freq: Once | INTRAMUSCULAR | Status: AC
Start: 2013-09-09 — End: 2013-09-09
  Administered 2013-09-09: 4 mg via INTRAVENOUS
  Filled 2013-09-09: qty 2

## 2013-09-09 MED ORDER — IPRATROPIUM-ALBUTEROL 0.5-2.5 MG/3ML IN SOLN *I*
3.0000 mL | Freq: Four times a day (QID) | RESPIRATORY_TRACT | Status: DC
Start: 2013-09-09 — End: 2013-09-15
  Administered 2013-09-09 – 2013-09-15 (×19): 3 mL via RESPIRATORY_TRACT
  Filled 2013-09-09 (×22): qty 3

## 2013-09-09 MED ORDER — CLOPIDOGREL BISULFATE 75 MG PO TABS *I*
75.0000 mg | ORAL_TABLET | Freq: Every day | ORAL | Status: DC
Start: 2013-09-10 — End: 2013-09-15
  Administered 2013-09-10 – 2013-09-15 (×6): 75 mg via ORAL
  Filled 2013-09-09 (×6): qty 1

## 2013-09-09 MED ORDER — PREDNISONE 20 MG PO TABS *I*
40.0000 mg | ORAL_TABLET | Freq: Every day | ORAL | Status: DC
Start: 2013-09-10 — End: 2013-09-12
  Administered 2013-09-10 – 2013-09-12 (×3): 40 mg via ORAL
  Filled 2013-09-09 (×3): qty 2

## 2013-09-09 MED ORDER — SODIUM CHLORIDE 0.9 % IV BOLUS *I*
1000.0000 mL | Freq: Once | Status: AC
Start: 2013-09-09 — End: 2013-09-09
  Administered 2013-09-09: 1000 mL via INTRAVENOUS

## 2013-09-09 MED ORDER — PANTOPRAZOLE SODIUM 40 MG PO TBEC *I*
40.0000 mg | DELAYED_RELEASE_TABLET | Freq: Every morning | ORAL | Status: DC
Start: 2013-09-10 — End: 2013-09-10
  Filled 2013-09-09: qty 1

## 2013-09-09 NOTE — ED Notes (Signed)
Flu like symptoms x 4 days, difficulty breathing today

## 2013-09-09 NOTE — ED Notes (Signed)
At rest, oxygenation on RA 95%. With ambulation oxygenation down to 90% with chest pain and increased work of breathing.

## 2013-09-09 NOTE — ED Notes (Signed)
Hospital bed ordered for pt.

## 2013-09-09 NOTE — ED Provider Progress Notes (Signed)
ED Provider Progress Note    Pt positive for RSV. CXR was normal. Pt's SOB slightly improved with duoneb treatments. Pt was ambulated without oxygen, his resting O2 saturation was 95% and ambulatory sat was 90%. Pt discussed with medicine team who agree to admit pt for further treatment and evaluation. Pt understands and agrees with plan.       Jairo BenMandeep Marlowe Cinquemani, MD, 09/09/2013, 6:01 PM

## 2013-09-09 NOTE — ED Notes (Signed)
Pt reports that he will not leave if his sickness is not cured. Pt reports nothing is being done to tx it. Discussed with patient his tx and what is being done for him while here. Discussed home tx. Pt up to ambulate to the bathroom, reports feeling dizzy with ambulation. Once in bed feels better. Reports that his CP is an 8/10, will discuss with provider. Will continue to monitor/tx per orders.

## 2013-09-09 NOTE — ED Notes (Addendum)
Patient states [facility] was [facility] and "caught the flu". SOB, DOE, hx of COPD. Expiratory wheezing. O2 sat 87% RA, placed on 3L NC and has improved. Face mask placed d/t flu like symptoms. Will continue to monitor and assess.

## 2013-09-09 NOTE — ED Notes (Signed)
ED RN INTERN ATTESTATION       I Catheryn BaconNgoc B Diep, RN (RN) reviewed the following charting information by the RN intern: SJ    Nursing Assessments  Medications  Plan of Care  Teaching   Notes    In the chart of Arn MedalJerry Gangemi (81(57 y.o. male) and attest to the charting being accurate.

## 2013-09-09 NOTE — H&P (Addendum)
Internal Medicine Admission H&P for Nathan Ochoa    CC: SOB    HPI: Nathan Ochoa is a 58 yo M w/ hx of COPD, CVA, who presents with 4 days of feeling unwell.  This includes cough, body aches, SOB, fevers, and rhinorrhea.  He has been taking his inhalers including symbicort and albuterol without benefit.  He feels very congested, but cannot get anything out.  Stated that he would check his oxygen at home and would desaturate.  He has fallen twice, states they are related to standing up or changing position. He wakes up on the ground and there is a period of time he does not remember. He has not taken good po, but has been able to drink. States he vomits what he takes in. Denies moving his bowels, but is passing gas. No abdominal pain.  Seen at [facility] for chest pain.  Unclear about a lot of his medicines.      PMH:  Past Medical History   Diagnosis Date    Hypertension     COPD (chronic obstructive pulmonary disease)     Asthma     GI bleed     GERD (gastroesophageal reflux disease)     Back pain     Pacemaker     Bradycardia        PSH:  Past Surgical History   Procedure Laterality Date    Back surgery       5 times since 1999    Pacemaker insertion         Home Medications:  Prior to Admission medications    Medication Sig Start Date End Date Taking? Authorizing Provider   lisinopril (PRINIVIL,ZESTRIL) 5 MG tablet Take 5 mg by mouth daily    [provider]    [place]  Provider must reconcile medications 12/08/10   Provider, Conversion   simvastatin (ZOCOR) 20 MG tablet Take 20 mg by mouth nightly.    [provider]   esomeprazole (NEXIUM) 40 MG capsule Take 40 mg by mouth every morning.    [provider]   hydrochlorothiazide (HYDRODIURIL) 25 MG tablet Take 25 mg by mouth daily.    [provider]   amlodipine (NORVASC) 10 MG tablet Take 10 mg by mouth daily.    [provider]   oxycodone-acetaminophen (PERCOCET) 5-325 MG per tablet Take 1 tablet  by mouth every 4 hours as needed.    [provider]   clotrimazole (LOTRIMIN) 1 % cream APPLY 2-3 TIMES DAILY TO AFFECTED AREA(S). 05/01/08   Provider, Conversion   zolpidem (AMBIEN) 10 MG tablet 1 tab by mouth at bedtime 10/31/07   Provider, Conversion   albuterol (PROVENTIL HFA) 108 (90 BASE) MCG/ACT inhaler INHALE 2 PUFFS AS NEEDED every 2 hours 08/08/07   Provider, Conversion   sildenafil (VIAGRA) 100 MG tablet TAKE 1 TABLET DAILY 1 HOUR BEFORE NEEDED 06/20/07   Provider, Conversion   omeprazole (PRILOSEC OTC) 20 MG tablet TAKE 1 TABLET TWICE DAILY 03/24/07   Provider, Conversion   aspirin 81 MG tablet TAKE 1 TABLET DAILY. 01/20/07   Provider, Conversion   simvastatin (ZOCOR) 40 MG tablet TAKE 1 TABLET DAILY AT BEDTIME. 01/20/07   Provider, Conversion   amLODIPine (NORVASC) 5 MG tablet TAKE 1 TABLET DAILY AS DIRECTED. 01/20/07   Provider, Conversion   hydrochlorothiazide (HYDRODIURIL) 25 MG tablet TAKE 1 TABLET DAILY. 01/20/07   Provider, Conversion        Allergies:  is allergic to dilaudid  and nsaids.     Family History:  family history includes COPD in his mother; Cancer in his father; Coronary art dis in his father; Lung cancer in his maternal grandfather.     Social History:   reports that he quit smoking about 2 weeks ago. His smoking use included Cigarettes. He has a 10 pack-year smoking history. He has never used smokeless tobacco. He reports that  drinks alcohol. He reports that he uses illicit drugs (Cocaine).     ROS:  Negative except HPI    Physical Exam:  Filed Vitals:    09/09/13 1627   BP:    Pulse:    Temp: 37.1 C (98.8 F)   Resp:    Height:    Weight:       No intake or output data in the 24 hours ending 09/09/13 1808    Gen: Patient is in NAD, laying in bed  HEENT: MMM, mask in place  Lungs: diffuse wheezing, nonfocal  Heart: RRR no m/r/g   Abd: BS+, soft, NT, ND  Ext: No edema  Neuro: A&Ox3, mild right ptosis, mild left facial droop, mild asymmetry in strength R>L, but 5/5  throughout    Laboratory:    Recent Labs  Lab 09/09/13  1333   WBC 9.5*   Hemoglobin 15.1   Hematocrit 46   Platelets 176   Seg Neut % 82.1*   Lymphocyte % 8.3*   Monocyte % 8.6   Eosinophil % 0.8       Recent Labs  Lab 09/09/13  1333   Sodium 141   Potassium 3.8   Chloride 105   CO2 23   UN 15   Creatinine 2.00*   Calcium 8.6   Glucose 126*     No results found for this basename: APTT, INR, PTT, PTI,  in the last 168 hours  No results found for this basename: CKTS, TROPU, TROP, RICKMBS, MCKMB, CKMB,  in the last 168 hours  No results found for this basename: ALK, TB, HTBIL, DB, ALB, ALB1, ALT, AST, FLTP, TP,  in the last 168 hours    Recent Labs  Lab 09/09/13  1333   Glucose 126*       Micro:         EKG: Sinus tach 112, inferior q waves - old    Imaging:  *chest Standard Frontal And Lateral Views    09/09/2013   IMPRESSION:   No acute cardiopulmonary disease.   END REPORT     I have personally reviewed the image(s) and the resident's  interpretation and agree with or edited the findings.      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  This includes cough, body aches, SOB, fevers, and rhinorrhea.    RSV, hx of COPD  - prednisone 60 mg x1 then 40 mg daily  - duonebs qid  - albuterol prn  - symbicort  BID  - 125 ml/hr NS    Syncope - sounds orthostatic in nature likely related to dehydration  - EKG without significant changes  - telemetry for 48 hours  - orthostatics would not be helpful due to boluses    AKI - creatinine 2, looks like baseline from [facility] is  1.3-1.5  - urine electrolytes  - trend  - boluses x 2 L, then 125 ml/hr    CVA  - home clopidogrel 75 mg daily     GERD  - pantoprazole 40 mg daily    HTN  - it appears he is on amlodipine, hctz/lisinopril - he is unsure dose  - needs med rec in am, pharmacy closed [facility]     F 125 ml/hr  E  daily  N regular    CODE STATUS: FULL    Author: Perrin Maltese, MD  as of: 09/09/2013 at: 6:08 PM    HMD Attending Addendum  I saw and evaluated the patient. I agree with the resident's findings and plan of care as documented above, which reflects my input.    58yo man with PMH COPD, CVA who presented with 4 days of generalized malaise, body aches, SOB, cough, subjective fevers, and poor PO intake. Frustrated that he has not been able to bring up sputum or eat, although he states he has an appetite now. Does endorse lightheadedness with falls but no head trauma or LOC.     Rest of HPI, PMH, FH, and SocHx as per Dr. Ferne Coe above. Last used cocaine about 1 week ago.    Review of Systems   Constitutional: Positive for fever, chills and malaise/fatigue.   HENT: Negative for sore throat.    Eyes: Negative for double vision.   Respiratory: Positive for cough, shortness of breath and wheezing. Negative for sputum production.    Cardiovascular: Positive for chest pain.        With cough   Gastrointestinal: Positive for nausea, vomiting and constipation. Negative for abdominal pain.   Genitourinary: Negative for dysuria, urgency and frequency.   Musculoskeletal: Positive for myalgias.   Skin: Negative for rash.   Neurological: Positive for dizziness. Negative for loss of consciousness.   Endo/Heme/Allergies: Does not bruise/bleed easily.   Psychiatric/Behavioral: Positive for substance abuse.     Exam as per Dr. Ferne Coe above with the following additions/exceptions:  HEENT: dry MM, anicteric sclera  Skin: no rashes    Labs reviewed and notable for:  Cr 2 (last here was 1.66 in 2011)  WBC 9.5  RSV +  UA notable for 2+ blood wih 2  RBCs, 6 hyaline casts  FeNa <1%    Imaging reviewed and notable for:  CXR with increased interstitial markings bilaterally on my read    A/P: 58yo man with PMH COPD, CVA who presented to the ED with symptoms of a viral syndrome and was found to have RSV infection causing COPD exacerbation.    #COPD Exacerbation d/t RSV  -cont prednisone 31m daily for total 5d  -cont home inhalers  -check CK given 2+ blood on UA with only 2 RBCs and hx of myalgias    #AKI  -FeNa c/w pre-renal  -agree with IVF as above  -follow BMP dialy  -hold HCTZ and lisinopril    Rest as above. Discussed with Dr. SFerne Coeand ED provider.    JEarnest Bailey MD on 09/09/2013 at 11:03 PM

## 2013-09-09 NOTE — ED Notes (Signed)
Assumed care of patient at this time. Will monitor and treat per orders.
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
## 2013-09-09 NOTE — ED Notes (Signed)
Patietn resting comfortably in bed, states he feels he is breathing much more easily. Expiratory wheezes have decreased after breathing tx. Will continue to monitor and assess.

## 2013-09-10 LAB — BASIC METABOLIC PANEL
Anion Gap: 12 (ref 7–16)
CO2: 21 mmol/L (ref 20–28)
Calcium: 8 mg/dL — ABNORMAL LOW (ref 8.6–10.2)
Chloride: 105 mmol/L (ref 96–108)
Creatinine: 1.8 mg/dL — ABNORMAL HIGH (ref 0.67–1.17)
GFR,Black: 47 * — AB
GFR,Caucasian: 41 * — AB
Glucose: 173 mg/dL — ABNORMAL HIGH (ref 60–99)
Lab: 15 mg/dL (ref 6–20)
Potassium: 4 mmol/L (ref 3.3–5.1)
Sodium: 138 mmol/L (ref 133–145)

## 2013-09-10 LAB — CBC AND DIFFERENTIAL
Baso # K/uL: 0 10*3/uL (ref 0.0–0.1)
Basophil %: 0.1 % — ABNORMAL LOW (ref 0.2–1.2)
Eos # K/uL: 0 10*3/uL (ref 0.0–0.5)
Eosinophil %: 0 % — ABNORMAL LOW (ref 0.8–7.0)
Hematocrit: 39 % — ABNORMAL LOW (ref 40–51)
Hemoglobin: 12.9 g/dL — ABNORMAL LOW (ref 13.7–17.5)
Lymph # K/uL: 0.7 10*3/uL — ABNORMAL LOW (ref 1.3–3.6)
Lymphocyte %: 7.1 % — ABNORMAL LOW (ref 21.8–53.1)
MCH: 30 pg/cell (ref 26–32)
MCHC: 33 g/dL (ref 32–37)
MCV: 90 fL (ref 79–92)
Mono # K/uL: 0.2 10*3/uL — ABNORMAL LOW (ref 0.3–0.8)
Monocyte %: 2.5 % — ABNORMAL LOW (ref 5.3–12.2)
Neut # K/uL: 8.4 10*3/uL — ABNORMAL HIGH (ref 1.8–5.4)
Platelets: 172 10*3/uL (ref 150–330)
RBC: 4.3 MIL/uL — ABNORMAL LOW (ref 4.6–6.1)
RDW: 14.6 % — ABNORMAL HIGH (ref 11.6–14.4)
Seg Neut %: 90.3 % — ABNORMAL HIGH (ref 34.0–67.9)
WBC: 9.3 10*3/uL — ABNORMAL HIGH (ref 4.2–9.1)

## 2013-09-10 LAB — EKG 12-LEAD
P: 35 degrees
QRS: 79 degrees
Rate: 112 {beats}/min
Severity: BORDERLINE
Severity: BORDERLINE
Statement: ABNORMAL
Statement: BORDERLINE
T: 46 degrees

## 2013-09-10 LAB — TROPONIN T: Troponin T: <0.01 ng/mL (ref 0.00–0.02)

## 2013-09-10 LAB — NT-PRO BNP: NT-pro BNP: 70 pg/mL (ref 0–900)

## 2013-09-10 MED ORDER — AMLODIPINE BESYLATE 5 MG PO TABS *I*
5.0000 mg | ORAL_TABLET | Freq: Every day | ORAL | Status: DC
Start: 2013-09-10 — End: 2013-09-11
  Filled 2013-09-10 (×2): qty 1

## 2013-09-10 MED ORDER — PANTOPRAZOLE SODIUM 40 MG PO TBEC *I*
40.0000 mg | DELAYED_RELEASE_TABLET | Freq: Every morning | ORAL | Status: DC
Start: 2013-09-10 — End: 2013-09-12
  Administered 2013-09-10 – 2013-09-12 (×3): 40 mg via ORAL
  Filled 2013-09-10 (×3): qty 1

## 2013-09-10 MED ORDER — LISINOPRIL 5 MG PO TABS *I*
5.0000 mg | ORAL_TABLET | Freq: Every day | ORAL | Status: DC
Start: 2013-09-11 — End: 2013-09-13
  Administered 2013-09-11 – 2013-09-12 (×2): 5 mg via ORAL
  Filled 2013-09-10 (×4): qty 1

## 2013-09-10 NOTE — Progress Notes (Signed)
Pt arrived on unit at 0050 in a hospital bed on telemetry & portable O2 w/ ED nurse and transport. VS, admission hx and assessment complete. A&ox3, oriented to call bell. Resting comfortably, will continue to monitor.
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
## 2013-09-10 NOTE — Progress Notes (Signed)
Utilization Management    Level of Care Inpatient as of the date 09/10/2013      Maliyah Willets, RN     Pager: [phone]

## 2013-09-10 NOTE — Plan of Care (Signed)
Patient will maintain adequate oxygenation Maintaining    VSq4, all WNL  Patient's pain or discomfort is manageable Maintaining    Pt c/o chest tightness from the COPD but held off for medications.  Patient will remain free of falls Maintaining    Pt a&ox3, has urinal at bedside for overnight

## 2013-09-10 NOTE — Student Note (Signed)
[facility] Medicine H&P Note    CC: SOB, chest pain, cough    HPI: Nathan Ochoa is a 58 yo man with a PMH of HTN, COPD, CVA x2, and bradycardia s/p pacemaker placement who presented with a history of SOB, cough, chest pain, chills, and fever for about the past 4 days. He says that he went to [facility] on 09/04/13 with a 1-day history of chest pain that felt like sharp, stabbing chest pain radiating into his shoulder and L arm. He fell once at [facility] while walking back to his bed but his workup including a CT and EKG were otherwise negative. The following day at home he started feeling like his head was "stopped up," and then the next day he started having a hard time breathing. He fell twice in his home after standing up from bed to walk to the bathroom. He does not recall how long he lost consciousness for, but believes it was <1 min and that he did not hit his head (though his floors are made of hard wood). He had no symptoms after falling. He developed a dry cough that up until this morning did not produce any sputum. His SOB worsened with walking and improved lying down. He took his symbicort 3x/day and his albuterol as much as 10x in one day, but these did not help the dyspnea. He also developed chills and a fever and took ibuprofen (600 mg x8 over 4 days), which helped reduce the fever. He continued to have chest pain, and this morning he says it is an 8/10 pain and feels like somebody is "sitting on his chest." The pain is tight and is worse with inspiration.    He has not had any appetite over the past 3-4 days, and has only eaten a sloppy joe sandwich and chicken soup, both of which he threw up after eating them. He did not notice any blood in his vomit. He says he has been drinking fluids (about 1 gallon of water/day) and had a BM yesterday.    In the ED, he received 650 mg of tylenol as well as 3 duoneb treatments. He also received 60 mg prednisone PO. He also received 4 mg x2 of morphine for chest  pain, and 4 mg of zofran for nausea. For fluids, he received 2 L boluses of NS and was started on maintenance IVF.    PMH/PSH:  -Essential hypertension  -COPD for the past 4 years - on albuterol PRN and symbicort inhaler 2 puffs 2x/day  -Asthma since the age of 45, treated with albuterol inhaler  -HLD - on lipitor 20 mg 1x/day  -GERD - on nexium  -2 CVAs in 2013 (one on L hemisphere and one on R hemisphere). Residual R ptosis and drooling out of L side of mouth as well as L-sided arm and leg weakness  -Bradycardia s/p pacemaker placement in 2010. He reports that his "heart stopped" and he received CPR, so a pacemaker was placed.    -Back surgery x3 for degenerative disc disease (he fell and hurt his back when he was younger)    Family Hx: His mom has a history of COPD and CHF, and his father died of throat cancer. His paternal grandfather died of lung cancer, and his maternal grandfather died of a stroke. His maternal uncle died of an MI.     Allergies: Allergic to dilaudid    Medications:   -Lisinopril 5 mg 1x/day  -Simvastatin 20 mg 1x/day  -Esomeprazole  20 mg 1x/day  -Hydrochlorothiazide 25 mg 1x/day  -Amlodipine 5 mg 1x/day  -Clopidogrel 75 mg 1x/day  -Albuterol inhaler PRN  -Symbicort inhaler 2 puffs 2x/day    Social Hx: He reports that he lives with his girlfriend. He has 6 children who are all adults and live out of the state. He says he quit smoking 2 weeks ago, but prior to that he smoked about 4-5 cigarettes/day for 42 years. He says he only drinks 3 beers every year. He does admit to using cocaine occasionally and using it 1 week ago.    ROS:   General - endorses 8 lb weight loss over past week, decreased appetite, fevers, and chills  HEENT - endorses rhinorrhea, congestion, blurry vision, and sore throat. No hearing changes  Respiratory - Endorses SOB, wheezing, and dry cough  Cardiovascular - Endorses chest pain, palpitations, and syncope  GI - Endorses some nausea and vomiting as well as mild  epigastric abdominal pain. Denies diarrhea or constipation  GU - no dysuria, hematuria, urgency, or frequency  Heme - no easy bruising or bleeding problems  Neuro - endorses a bilateral headache behind his ears, dizziness, and some numbness and weakness in his L arm and leg. Denies any other weakness or sensory changes. No dysphagia or hoarseness  Skin - no rashes    Objective      Physical Exam  Vitals: BP 139/71  Pulse 70  Temp(Src) 36.6 C (97.9 F) (Temporal)  Resp 24  Ht 1.829 m (6' 0.01")  Wt 136.079 kg (300 lb)  BMI 40.68 kg/m2  SpO2 92%      I/O:   Intake/Output Summary (Last 24 hours) at 09/10/13 0815  Last data filed at 09/10/13 0424   Gross per 24 hour   Intake    480 ml   Output    200 ml   Net    280 ml       Constitutional: Resting in bed, diaphoretic and tachypneic. Appears distressed  HEENT: Normocephalic. Atraumatic. No conjunctival injection. External ears normal. No oropharyngeal lesions or exudates. Moist mucous membranes. Supple neck without any lymphadenopathy. No masses.  Cardiovascular:  Distant heart sounds. RRR, normal S1/S2, no murmurs, rubs, or gallops. No jugular venous distention was noted.   Respiratory: Increased work of breathing with accessory muscle use. Mid to end expiratory wheezes were heard throughout all lung fields. Increased tactile fremitus in R lower lung field. No crackles or rhonchi.   Abdomen: Soft and nondistended. Normoactive bowel sounds. Mild epigastric and suprapubic tenderness on light and deep palpation. No rebound or guarding. No hepatosplenomegaly. No masses.  Extremities:  Warm. Trace non-pitting edema in both ankles up to mid-calves  Neurologic:  Alert and oriented x3. PERRL bilaterally. EOMs intact. Normal facial sensation on R but decreased sensation to light touch throughout L face. Slight facial muscle weakness on L side, and slight R ptosis was noticeable. No palate asymmetry. Full strength throughout R arm and leg, and 4/5 strength throughout L  arm and leg in all modalities. Sensation to light touch decreased on L arm and leg, normal sensation on R arm and leg. No abnormal movements. Reflexes 2+ throughout all extremities.   Skin:  No rashes evident.    Labs:  Recent Results (from the past 24 hour(s))   CBC AND DIFFERENTIAL    Collection Time     09/09/13  1:33 PM       Result Value Range    WBC 9.5 (*) 4.2 - 9.1  THOU/uL    RBC 5.0  4.6 - 6.1 MIL/uL    Hemoglobin 15.1  13.7 - 17.5 g/dL    Hematocrit 46  40 - 51 %    MCV 91  79 - 92 fL    MCH 30  26 - 32 pg/cell    MCHC 33  32 - 37 g/dL    RDW 14.5 (*) 11.6 - 14.4 %    Platelets 176  150 - 330 THOU/uL    Seg Neut % 82.1 (*) 34.0 - 67.9 %    Lymphocyte % 8.3 (*) 21.8 - 53.1 %    Monocyte % 8.6  5.3 - 12.2 %    Eosinophil % 0.8  0.8 - 7.0 %    Basophil % 0.2  0.2 - 1.2 %    Neut # K/uL 7.8 (*) 1.8 - 5.4 THOU/uL    Lymph # K/uL 0.8 (*) 1.3 - 3.6 THOU/uL    Mono # K/uL 0.8  0.3 - 0.8 THOU/uL    Eos # K/uL 0.1  0.0 - 0.5 THOU/uL    Baso # K/uL 0.0  0.0 - 0.1 THOU/uL   BASIC METABOLIC PANEL    Collection Time     09/09/13  1:33 PM       Result Value Range    Glucose 126 (*) 60 - 99 mg/dL    Sodium 141  133 - 145 mmol/L    Potassium 3.8  3.3 - 5.1 mmol/L    Chloride 105  96 - 108 mmol/L    CO2 23  20 - 28 mmol/L    Anion Gap 13  7 - 16    UN 15  6 - 20 mg/dL    Creatinine 2.00 (*) 0.67 - 1.17 mg/dL    GFR,Caucasian 36 (*)     GFR,Black 42 (*)     Calcium 8.6  8.6 - 10.2 mg/dL   INFLUENZA A & B/RSV PCR    Collection Time     09/09/13  1:33 PM       Result Value Range    Influenza A&B/RSV PCR        Value: Positive for Respiratory Syncytial Virus Nucleic Acid   CK    Collection Time     09/09/13  1:33 PM       Result Value Range    CK 159  46 - 171 U/L   URINALYSIS WITH MICROSCOPIC    Collection Time     09/09/13  7:32 PM       Result Value Range    Color, UA Yellow  Yellow    Appearance,UR Clear  Clear    Specific Gravity,UA 1.017  1.002 - 1.030    Leuk Esterase,UA NEG  NEGATIVE    Nitrite,UA NEG  NEGATIVE    pH,UA  5.0  5.0 - 8.0    Protein,UA NEG  NEGATIVE mg/dL    Glucose,UA NORM      Ketones, UA NEG  NEGATIVE    Blood,UA 2+ (*) NEGATIVE    RBC,UA 2  0 - 2 /hpf    WBC,UA 4  0 - 5 /hpf    Hyaline Casts,UA 6 (*) 0 - 2 /lpf    Squam Epithel,UA 1+  0-1+    Mucus,UA Present     SODIUM, URINE    Collection Time     09/09/13  7:32 PM       Result Value Range    Sodium,UR 50  CREATININE, URINE    Collection Time     09/09/13  7:32 PM       Result Value Range    Creatinine,UR 244  20 - 300 mg/dL   BASIC METABOLIC PANEL    Collection Time     09/10/13  1:06 AM       Result Value Range    Glucose 173 (*) 60 - 99 mg/dL    Sodium 138  133 - 145 mmol/L    Potassium 4.0  3.3 - 5.1 mmol/L    Chloride 105  96 - 108 mmol/L    CO2 21  20 - 28 mmol/L    Anion Gap 12  7 - 16    UN 15  6 - 20 mg/dL    Creatinine 1.80 (*) 0.67 - 1.17 mg/dL    GFR,Caucasian 41 (*)     GFR,Black 47 (*)     Calcium 8.0 (*) 8.6 - 10.2 mg/dL   CBC AND DIFFERENTIAL    Collection Time     09/10/13  1:06 AM       Result Value Range    WBC 9.3 (*) 4.2 - 9.1 THOU/uL    RBC 4.3 (*) 4.6 - 6.1 MIL/uL    Hemoglobin 12.9 (*) 13.7 - 17.5 g/dL    Hematocrit 39 (*) 40 - 51 %    MCV 90  79 - 92 fL    MCH 30  26 - 32 pg/cell    MCHC 33  32 - 37 g/dL    RDW 14.6 (*) 11.6 - 14.4 %    Platelets 172  150 - 330 THOU/uL    Seg Neut % 90.3 (*) 34.0 - 67.9 %    Lymphocyte % 7.1 (*) 21.8 - 53.1 %    Monocyte % 2.5 (*) 5.3 - 12.2 %    Eosinophil % 0.0 (*) 0.8 - 7.0 %    Basophil % 0.1 (*) 0.2 - 1.2 %    Neut # K/uL 8.4 (*) 1.8 - 5.4 THOU/uL    Lymph # K/uL 0.7 (*) 1.3 - 3.6 THOU/uL    Mono # K/uL 0.2 (*) 0.3 - 0.8 THOU/uL    Eos # K/uL 0.0  0.0 - 0.5 THOU/uL    Baso # K/uL 0.0  0.0 - 0.1 THOU/uL         Imaging:  09/09/2013 2:42 PM   CHEST FRONTAL AND LAT    IMPRESSION:   No acute cardiopulmonary disease.    Assessment     This is a case of chest pain, SOB, cough, fever, and chills in a 58 yo man with a PMH significant for HTN, HLD, COPD, CVA x2, and bradycardia s/p pacemaker placement.  Given his history of cocaine abuse, his initial chest pain upon presentation to [facility] may have been caused by a supply-mediated ischemic event due to vasospasm from cocaine. He most likely subsequently contracted RSV bronchiolitis that caused an exacerbation of his existing COPD, leading to his SOB, dry cough, chills, and fever. His current chest pain is most likely due to costochondritis from his coughing fits, but could also be due to angina or an ischemic event. His episodes of syncope were most likely due to dehydration as a result of poor PO intake and multiple episodes of vomiting, leading to orthostatic hypotension. This is consistent with his resulting prerenal AKI (due to a FeNa of <1%), which has improved after multiple fluid boluses. A less likely possibility for the syncopal episodes  is another CVA, however he does not appear to have any new onset focal neurologic deficits on exam.     Plan     1. COPD exacerbation due to RSV bronchiolitis  -S/p 60 mg prednisone PO x1  -Day 1/5 of 5-day course of prednisone PO 40 mg  -Duonebs 4x/day  -Continue symbicort inhaler 2 puffs 2x/day  -Albuterol inhaler q4h PRN for wheezing and SOB  -Tylenol PRN for fever    2. Episodes of syncope  -Received 2 L NS fluid boluses  -Likely due to dehydration and orthostatic hypotension- continue mIVF (NS 125 mL/hr)  -EKG revealed only occasional irregular rhythm but otherwise normal sinus rhythm  -Continue telemetry for 24 hrs  -Consider head CT non-contrast to rule out CVA or intracerebral hemorrhage    3. Acute Kidney Injury  -Like prerenal in origin as FeNA is <1% (0.3%)  -Continue to follow BMP and urine electrolytes  -Continue mIVF  -Home antihypertensives held due to initial hypotension of 107/65. Can restart amlodipine 5 mg and lisinopril 5 mg. Hold HCTZ due to AKI     4. History of CVA  -Continue clopidogrel 75 mg 1x/day    5. GERD  -Continue pantoprazole 40 mg 1x/day    6. HTN  -Hold HCTZ, restart amlodipine 5 mg 1x/day and  lisinopril 5 mg 1x/day    7. Possible CHF  -Obtain BNP today to rule out CHF  -Can consider echocardiogram    8. Chest pain  -Could be due to demand-mediated ischemia. Check troponins to rule out ischemic event    F:  NS 125 mL/hr  E:  Daily BMP  N:  Regular diet  DVT Prophylaxis:  lovenox     Code Status:  FULL CODE    Disposition: likely in 24-48 hrs pending stabilization of symptoms, weaning off O2, and resolution of AKI    Angela Burke, Revonda Humphrey, Pager [phone]  This is a Careers information officer note. Please see attending note for final assessment and plan.

## 2013-09-10 NOTE — ED Notes (Signed)
ED RN INTERN ATTESTATION       I Nathan SaucerSarah C Sherrika Weakland, RN (RN) reviewed the following charting information by the RN intern: Nathan Ochoa    Nursing Assessments  Medications  Plan of Care  Teaching   Notes    In the chart of Nathan Ochoa (16(57 y.o. male) and attest to the charting being accurate.

## 2013-09-10 NOTE — Progress Notes (Addendum)
Hospital Medicine Progress Note                                                Significant 24 Hour Events      - no acute events overnight    Subjective      Patient feeling short of breath, feeling some chest pressure, "tightness", like a band around his chest. Patient is relieved that he is being admitted, states he was feeling pretty terrible at home.     Endorsed rigors, chills, nausea, vomiting, no diarrhea at home; has not had any vomiting here, but endorses low appetite.     Patient was [facility] ED for chest pain about 1 week prior, admits to cocaine use at that time.     Objective      Physical Exam  Recent vital signs reviewed and notable for:  BP: (92-139)/(53-81)   Temp:  [35.5 C (95.9 F)-37.1 C (98.8 F)]   Temp src:  [-]   Heart Rate:  [70-117]   Resp:  [20-24]   SpO2:  [92 %-96 %]   Height:  [182.9 cm (6' 0.01")]   Weight:  [136.079 kg (300 lb)]       General Constitutional:   Obese barrel chested male lying in bed, with nasal cannula, easy respirations.   HEENT: PERRL, MM slightly dry  Cardiovascular:  Bradycardic and distant, no m/r/g; 2+ radial pulses. 1+ pitting edema LE bilaterally.   Pulmonary:  Diffuse insp + exp wheezes bilaterally  Gastrointestinal: NBS, soft, ND, some mild TTP in midline lower quadrant.   Musculoskeletal: normal bulk and tone   Neurologic:  Left facial droop, right ptosis, decreased left shoulder shrug, left SCM strength. Otherwise all other CN's intact. Decreased left arm and leg strength, 4/5.   Skin:  No rashes    Recent Lab, Micro, and Imaging Studies   Personally reviewed and notable for:    Recent Results (from the past 24 hour(s))   CBC AND DIFFERENTIAL    Collection Time     09/09/13  1:33 PM       Result Value Range    WBC 9.5 (*) 4.2 - 9.1 THOU/uL    RBC 5.0  4.6 - 6.1 MIL/uL    Hemoglobin 15.1  13.7 - 17.5 g/dL    Hematocrit 46  40 - 51 %    MCV 91  79 - 92 fL    MCH 30  26 - 32 pg/cell    MCHC 33  32 - 37 g/dL    RDW 16.114.5 (*) 09.611.6 - 14.4 %    Platelets  176  150 - 330 THOU/uL    Seg Neut % 82.1 (*) 34.0 - 67.9 %    Lymphocyte % 8.3 (*) 21.8 - 53.1 %    Monocyte % 8.6  5.3 - 12.2 %    Eosinophil % 0.8  0.8 - 7.0 %    Basophil % 0.2  0.2 - 1.2 %    Neut # K/uL 7.8 (*) 1.8 - 5.4 THOU/uL    Lymph # K/uL 0.8 (*) 1.3 - 3.6 THOU/uL    Mono # K/uL 0.8  0.3 - 0.8 THOU/uL    Eos # K/uL 0.1  0.0 - 0.5 THOU/uL    Baso # K/uL 0.0  0.0 - 0.1 THOU/uL   BASIC METABOLIC PANEL  Collection Time     09/09/13  1:33 PM       Result Value Range    Glucose 126 (*) 60 - 99 mg/dL    Sodium 161  096 - 045 mmol/L    Potassium 3.8  3.3 - 5.1 mmol/L    Chloride 105  96 - 108 mmol/L    CO2 23  20 - 28 mmol/L    Anion Gap 13  7 - 16    UN 15  6 - 20 mg/dL    Creatinine 4.09 (*) 0.67 - 1.17 mg/dL    GFR,Caucasian 36 (*)     GFR,Black 42 (*)     Calcium 8.6  8.6 - 10.2 mg/dL   INFLUENZA A & B/RSV PCR    Collection Time     09/09/13  1:33 PM       Result Value Range    Influenza A&B/RSV PCR        Value: Positive for Respiratory Syncytial Virus Nucleic Acid   CK    Collection Time     09/09/13  1:33 PM       Result Value Range    CK 159  46 - 171 U/L   URINALYSIS WITH MICROSCOPIC    Collection Time     09/09/13  7:32 PM       Result Value Range    Color, UA Yellow  Yellow    Appearance,UR Clear  Clear    Specific Gravity,UA 1.017  1.002 - 1.030    Leuk Esterase,UA NEG  NEGATIVE    Nitrite,UA NEG  NEGATIVE    pH,UA 5.0  5.0 - 8.0    Protein,UA NEG  NEGATIVE mg/dL    Glucose,UA NORM      Ketones, UA NEG  NEGATIVE    Blood,UA 2+ (*) NEGATIVE    RBC,UA 2  0 - 2 /hpf    WBC,UA 4  0 - 5 /hpf    Hyaline Casts,UA 6 (*) 0 - 2 /lpf    Squam Epithel,UA 1+  0-1+    Mucus,UA Present     SODIUM, URINE    Collection Time     09/09/13  7:32 PM       Result Value Range    Sodium,UR 50     CREATININE, URINE    Collection Time     09/09/13  7:32 PM       Result Value Range    Creatinine,UR 244  20 - 300 mg/dL   BASIC METABOLIC PANEL    Collection Time     09/10/13  1:06 AM       Result Value Range    Glucose 173 (*)  60 - 99 mg/dL    Sodium 811  914 - 782 mmol/L    Potassium 4.0  3.3 - 5.1 mmol/L    Chloride 105  96 - 108 mmol/L    CO2 21  20 - 28 mmol/L    Anion Gap 12  7 - 16    UN 15  6 - 20 mg/dL    Creatinine 9.56 (*) 0.67 - 1.17 mg/dL    GFR,Caucasian 41 (*)     GFR,Black 47 (*)     Calcium 8.0 (*) 8.6 - 10.2 mg/dL   CBC AND DIFFERENTIAL    Collection Time     09/10/13  1:06 AM       Result Value Range    WBC 9.3 (*) 4.2 - 9.1 THOU/uL  RBC 4.3 (*) 4.6 - 6.1 MIL/uL    Hemoglobin 12.9 (*) 13.7 - 17.5 g/dL    Hematocrit 39 (*) 40 - 51 %    MCV 90  79 - 92 fL    MCH 30  26 - 32 pg/cell    MCHC 33  32 - 37 g/dL    RDW 16.1 (*) 09.6 - 14.4 %    Platelets 172  150 - 330 THOU/uL    Seg Neut % 90.3 (*) 34.0 - 67.9 %    Lymphocyte % 7.1 (*) 21.8 - 53.1 %    Monocyte % 2.5 (*) 5.3 - 12.2 %    Eosinophil % 0.0 (*) 0.8 - 7.0 %    Basophil % 0.1 (*) 0.2 - 1.2 %    Neut # K/uL 8.4 (*) 1.8 - 5.4 THOU/uL    Lymph # K/uL 0.7 (*) 1.3 - 3.6 THOU/uL    Mono # K/uL 0.2 (*) 0.3 - 0.8 THOU/uL    Eos # K/uL 0.0  0.0 - 0.5 THOU/uL    Baso # K/uL 0.0  0.0 - 0.1 THOU/uL         *chest Standard Frontal And Lateral Views    09/09/2013   IMPRESSION:   No acute cardiopulmonary disease.   END REPORT     I have personally reviewed the image(s) and the resident's  interpretation and agree with or edited the findings.      Assessment     This is a case of fevers/chills/SOB + CP in a 58 yo M history of COPD, CVA, h/o MI, chronic back pain, prior tobacco use; found to be RSV positive.     Plan     RSV, hx of COPD   - prednisone 60 mg given yesterday, getting pO prednisone 40 mg daily; day 2 of steroids.   - duonebs qid   - albuterol prn   - symbicort BID (takes at home)    Syncope - sounds orthostatic in nature likely related to dehydration   - EKG without significant changes   - telemetry for 48 hours- bradycardia to the high 40s, 50s, but otherwise no events thus far.    - orthostatics would not be helpful due to boluses   - patient with no criteria for CT  head via [facility], did not hit head, will defer imaging at this time.     Chest pain- patient endorses remote history of MI resulting with sudden cardiac arrest requiring CPR, isnt sure if he has CAD. Patient endorses cocaine induced CP at [facility] prior.   - troponin check now.     AKI - creatinine 2 on admit, looks like baseline from [facility] is 1.3-1.5; this AM of 1.8   - prerenal AKI- FeNa of 0.3%  - trend with daily BMP  - NS at 125 ml/hr; will d/c when pt's nausea controlled and able to take pO.      CVA   - home clopidogrel 75 mg daily     GERD   - omeprazole at home --> pantoprazole 40 mg daily     HTN BP meds initially held for hypotension, resolved, now with SBP in the 130s.    - home BP meds- Lisinopril 5mg , HCTZ 25mg , amlodipine 5mg  will restart amlodipine 5mg  now.      F 125 ml/hr  E daily  N regular   CODE STATUS: FULL  Demaris Callander, MD on 09/10/2013 at 8:33 AM  Pager [phone]    Attending note.    Patient seen and examined personally. Reviewed above note by Dr. Diana Eves . Agree with exam and plan with following comments:    Feeling better.  Did not complain of chest pain to me.  Will cont present treatment with prednisone and nebs and O2 supplement.  Cont other treatment.

## 2013-09-11 LAB — CBC AND DIFFERENTIAL
Baso # K/uL: 0 10*3/uL (ref 0.0–0.1)
Basophil %: 0.4 % (ref 0.2–1.2)
Eos # K/uL: 0 10*3/uL (ref 0.0–0.5)
Eosinophil %: 0 % — ABNORMAL LOW (ref 0.8–7.0)
Hematocrit: 38 % — ABNORMAL LOW (ref 40–51)
Hemoglobin: 12.4 g/dL — ABNORMAL LOW (ref 13.7–17.5)
Lymph # K/uL: 1.9 10*3/uL (ref 1.3–3.6)
Lymphocyte %: 22.4 % (ref 21.8–53.1)
MCH: 29 pg/cell (ref 26–32)
MCHC: 33 g/dL (ref 32–37)
MCV: 90 fL (ref 79–92)
Mono # K/uL: 0.9 10*3/uL — ABNORMAL HIGH (ref 0.3–0.8)
Monocyte %: 11.4 % (ref 5.3–12.2)
Neut # K/uL: 5.4 10*3/uL (ref 1.8–5.4)
Platelets: 186 10*3/uL (ref 150–330)
RBC: 4.2 MIL/uL — ABNORMAL LOW (ref 4.6–6.1)
RDW: 14.6 % — ABNORMAL HIGH (ref 11.6–14.4)
Seg Neut %: 65.8 % (ref 34.0–67.9)
WBC: 8.3 10*3/uL (ref 4.2–9.1)

## 2013-09-11 LAB — BASIC METABOLIC PANEL
Anion Gap: 12 (ref 7–16)
CO2: 22 mmol/L (ref 20–28)
Calcium: 7.9 mg/dL — ABNORMAL LOW (ref 8.6–10.2)
Chloride: 109 mmol/L — ABNORMAL HIGH (ref 96–108)
Creatinine: 1.28 mg/dL — ABNORMAL HIGH (ref 0.67–1.17)
GFR,Black: 71 *
GFR,Caucasian: 62 *
Glucose: 106 mg/dL — ABNORMAL HIGH (ref 60–99)
Lab: 9 mg/dL (ref 6–20)
Potassium: 3.7 mmol/L (ref 3.3–5.1)
Sodium: 143 mmol/L (ref 133–145)

## 2013-09-11 MED ORDER — HYDROMORPHONE HCL 2 MG PO TABS *I*
1.0000 mg | ORAL_TABLET | ORAL | Status: AC | PRN
Start: 2013-09-11 — End: 2013-09-11
  Administered 2013-09-11: 1 mg via ORAL
  Filled 2013-09-11: qty 1

## 2013-09-11 MED ORDER — AMOXICILLIN 500 MG PO CAPS *I*
500.0000 mg | ORAL_CAPSULE | Freq: Two times a day (BID) | ORAL | Status: DC
Start: 2013-09-11 — End: 2013-09-11
  Administered 2013-09-11: 500 mg via ORAL
  Filled 2013-09-11 (×2): qty 1

## 2013-09-11 MED ORDER — CETIRIZINE HCL 5 MG PO TABS *I*
10.0000 mg | ORAL_TABLET | Freq: Every day | ORAL | Status: DC
Start: 2013-09-11 — End: 2013-09-15
  Administered 2013-09-11 – 2013-09-15 (×5): 10 mg via ORAL
  Filled 2013-09-11 (×5): qty 2

## 2013-09-11 MED ORDER — GUAIFENESIN 600 MG PO TB12 *I*
600.0000 mg | ORAL_TABLET | Freq: Two times a day (BID) | ORAL | Status: DC
Start: 2013-09-11 — End: 2013-09-15
  Administered 2013-09-11 – 2013-09-15 (×9): 600 mg via ORAL
  Filled 2013-09-11 (×10): qty 1

## 2013-09-11 MED ORDER — PHENOL 1.4 % MT LIQD *I*
1.0000 | OROMUCOSAL | Status: DC | PRN
Start: 2013-09-11 — End: 2013-09-15
  Administered 2013-09-11 – 2013-09-15 (×12): 1 via ORAL
  Filled 2013-09-11 (×2): qty 20

## 2013-09-11 MED ORDER — MENTHOL THROAT LOZENGE *I*
1.0000 | LOZENGE | OROMUCOSAL | Status: DC | PRN
Start: 2013-09-11 — End: 2013-09-15
  Administered 2013-09-11 – 2013-09-12 (×3): 1 via BUCCAL
  Filled 2013-09-11 (×24): qty 1

## 2013-09-11 MED ORDER — AMPICILLIN-SULBACTAM IN NS 1.5 GM *I*
1500.0000 mg | Freq: Four times a day (QID) | INTRAVENOUS | Status: DC
Start: 2013-09-11 — End: 2013-09-13
  Administered 2013-09-11 – 2013-09-13 (×9): 1500 mg via INTRAVENOUS
  Filled 2013-09-11 (×12): qty 50

## 2013-09-11 MED ORDER — AMLODIPINE BESYLATE 5 MG PO TABS *I*
5.0000 mg | ORAL_TABLET | Freq: Every day | ORAL | Status: DC
Start: 2013-09-11 — End: 2013-09-12
  Administered 2013-09-11: 5 mg via ORAL
  Filled 2013-09-11 (×2): qty 1

## 2013-09-11 NOTE — Student Note (Signed)
[facility] Medicine Progress Note                                          Significant 24 Hour Events      One episode of hypertension overnight to 160/94. He was given 5 mg of lisinopril.    Subjective      Nathan Ochoa reports that he is "not doing well" this morning, partly because he was unable to sleep all night due to the patient next to him setting off the bed alarm constantly. He says that he feels worse than yesterday and is frustrated that he still is not coughing up any secretions. His head feels better, but now he feels pressure behind his eyes and feels congested. He has been able to drink fluids and eat some without any nausea or vomiting. However, he still feels short of breath and tight chest pressure and continues to wheeze. He also mentioned that he feels like his heart is racing and is "irregular."    Objective      Physical Exam  Vitals: BP 144/84  Pulse 60  Temp(Src) 37.5 C (99.5 F) (Temporal)  Resp 22  Ht 1.829 m (6' 0.01")  Wt 136.079 kg (300 lb)  BMI 40.68 kg/m2  SpO2 95%      I/O:   Intake/Output Summary (Last 24 hours) at 09/11/13 0630  Last data filed at 09/10/13 1049   Gross per 24 hour   Intake 1437.5 ml   Output    225 ml   Net 1212.5 ml       Constitutional: Irritable and distressed, tachypneic.  HEENT: Normocephalic. Atraumatic. No conjunctival injection. External ears normal. No oropharyngeal lesions or exudates. Moist mucous membranes. Supple neck without any lymphadenopathy. No masses.  Cardiovascular:  Some irregular beats were noted, but he was partly in normal sinus rhythm. Normal S1/S2, no murmurs, rubs, or gallops.  Respiratory:  Increased work of breathing with accessory muscle use. Diffuse mid to end expiratory wheezes present throughout all lung fields. No crackles or rhonchi.  Abdomen: Soft, nondistended. Some suprapubic and epigastric tenderness to light and deep palpation. No hepatosplenomegaly. No masses  Extremities:  Warm. Trace non-pitting  edema up to mid-calves  Neurologic: PERRL bilaterally. EOMs intact. Facial sensation decreased on L face. Slight R ptosis and L facial muscle weakness. No palate asymmetry.4/5 strength in L upper and lower extremity, full strength in R upper and lower extremity. Sensation decreased (numb) in L upper and lower extremity, normal sensation in R upper and lower extremity. No abnormal movements.  Skin:  No rashes evident.    Labs:  Recent Results (from the past 24 hour(s))   TROPONIN T    Collection Time     09/10/13 10:01 AM       Result Value Range    Troponin T <0.01  0.00 - 0.02 ng/mL   NT-PRO BNP    Collection Time     09/10/13 10:01 AM       Result Value Range    NT-pro BNP 70  0 - 900 pg/mL   BASIC METABOLIC PANEL    Collection Time     09/11/13  3:30 AM       Result Value Range    Glucose 106 (*) 60 - 99 mg/dL    Sodium 143  133 - 145 mmol/L    Potassium 3.7  3.3 - 5.1 mmol/L    Chloride 109 (*) 96 - 108 mmol/L    CO2 22  20 - 28 mmol/L    Anion Gap 12  7 - 16    UN 9  6 - 20 mg/dL    Creatinine 1.28 (*) 0.67 - 1.17 mg/dL    GFR,Caucasian 62      GFR,Black 71      Calcium 7.9 (*) 8.6 - 10.2 mg/dL   CBC AND DIFFERENTIAL    Collection Time     09/11/13  3:30 AM       Result Value Range    WBC 8.3  4.2 - 9.1 THOU/uL    RBC 4.2 (*) 4.6 - 6.1 MIL/uL    Hemoglobin 12.4 (*) 13.7 - 17.5 g/dL    Hematocrit 38 (*) 40 - 51 %    MCV 90  79 - 92 fL    MCH 29  26 - 32 pg/cell    MCHC 33  32 - 37 g/dL    RDW 14.6 (*) 11.6 - 14.4 %    Platelets 186  150 - 330 THOU/uL    Seg Neut % 65.8  34.0 - 67.9 %    Lymphocyte % 22.4  21.8 - 53.1 %    Monocyte % 11.4  5.3 - 12.2 %    Eosinophil % 0.0 (*) 0.8 - 7.0 %    Basophil % 0.4  0.2 - 1.2 %    Neut # K/uL 5.4  1.8 - 5.4 THOU/uL    Lymph # K/uL 1.9  1.3 - 3.6 THOU/uL    Mono # K/uL 0.9 (*) 0.3 - 0.8 THOU/uL    Eos # K/uL 0.0  0.0 - 0.5 THOU/uL    Baso # K/uL 0.0  0.0 - 0.1 THOU/uL         Imaging:  09/09/2013 2:42 PM   CHEST FRONTAL AND LAT     IMPRESSION:   No acute cardiopulmonary  disease.    Assessment     This is a case of chest pain, SOB, cough, fever, and chills in a 58 yo man with a PMH significant for HTN, HLD, COPD, CVA x2, and bradycardia s/p pacemaker placement. Given his history of cocaine abuse, his initial chest pain upon presentation to [facility] may have been caused by a supply-mediated ischemic event due to vasospasm from cocaine. He most likely subsequently contracted RSV bronchiolitis that caused an exacerbation of his existing COPD, leading to his SOB, dry cough, chills, and fever. His current chest pain is most likely due to costochondritis from his coughing fits, but could also be due to angina or an ischemic event. His episodes of syncope were most likely due to dehydration as a result of poor PO intake and multiple episodes of vomiting, leading to orthostatic hypotension. This is consistent with his resulting prerenal AKI (due to a FeNa of <1%), which has improved after multiple fluid boluses. A less likely possibility for the syncopal episodes is another CVA, however he does not appear to have any new onset focal neurologic deficits on exam.     Plan     1. COPD exacerbation due to RSV bronchiolitis   -S/p 60 mg prednisone PO x1   -Day 2/5 of 5-day course of prednisone PO 40 mg   -Continue duonebs 4x/day   -Continue symbicort inhaler 2 puffs 2x/day   -Albuterol inhaler q4h PRN for wheezing and SOB   -Tylenol PRN for fever  -Add chest  physiotherapy today to help with secretions  -Consider adding an expectorant to help bring up secretions    2. Episodes of syncope   -Received 2 L NS fluid boluses   -Likely due to dehydration and orthostatic hypotension  -Can discontinue mIVF today as he seems to be drinking regularly  -EKG revealed only occasional irregular rhythm but otherwise normal sinus rhythm   -Continue telemetry for today   -Consider head CT non-contrast to rule out CVA or intracerebral hemorrhage     3. Acute Kidney Injury   -Resolving, creatinine improved to  1.28  -Likely prerenal in origin as FeNA is <1% (0.3%)   -Continue to follow BMP and urine electrolytes   -Home antihypertensives held due to initial hypotension of 107/65. Restart amlodipine 5 mg and lisinopril 5 mg. Hold HCTZ due to AKI     4. History of CVA   -Continue clopidogrel 75 mg 1x/day     5. GERD   -Continue pantoprazole 40 mg 1x/day     6. HTN   -Hold HCTZ, restart amlodipine 5 mg 1x/day and lisinopril 5 mg 1x/day     7. Possible CHF   -Ruled out given BNP of 70    8. Chest pain   -Likely costochondritis  -MI or ischemic event seems unlikely given troponin T of <0.01    9. Irregular heart rate  -Repeat EKG this morning        F:  Can discontinue mIVF today  E:  Daily BMP  N:  Encourage PO  DVT Prophylaxis:   lovenox    Code Status:  FULL CODE    Disposition: pending further stabilization over next 24-48 hrs    Angela Burke, Revonda Humphrey, Pager [phone]  This is a Careers information officer note. Please see attending note for final assessment and plan.

## 2013-09-11 NOTE — Progress Notes (Signed)
Interim Progress Note    Was called to evaluate pt for tooth pain and tongue swelling.     Patient stating he had a wisdom tooth removed months ago, now having worsening right mandibular pain, states that his tongue feels swollen on the same side as the pain. On exam, pt's tongue appears similar to this morning, not swollen (high MP score at baseline), also could not appreciate any buccal or dental issues in the location patient was describing.     Patient asking for more dilaudid at higher doses. States he tolerated the dilaudid he had this morning well without any issues, was pain free. States his prior reaction to dilaudid has been itching and some nausea, but only at very high doses.     Pt can use phenol spray for tooth pain.     Demaris CallanderHannah Graylen [place], MD

## 2013-09-11 NOTE — Progress Notes (Addendum)
Hospital Medicine Progress Note                                                Significant 24 Hour Events      - no acute events overnight  - BP continued to rise at 160/94 overnight, home amlodipine started yesterday AM, lisinopril started overnight.     Subjective      Patient feels worse this morning. Worsening facial congestion, now with itchy watery eyes, had a nose bleed overnight, sinus fullness and eye rpessure bilaterally, also with worsening ear pain, right >lft, also worsening sore throat right >left, feels his wheezing and SOB is worse as well. Denies any fevers, chills. Also endorsing chest congestion.     Objective      Physical Exam  Recent vital signs reviewed and notable for:  BP: (139-160)/(71-96)   Temp:  [36.3 C (97.4 F)-37.5 C (99.5 F)]   Temp src:  [-]   Heart Rate:  [60-84]   Resp:  [20-24]   SpO2:  [92 %-95 %]       General Constitutional: Obese barrel chested male lying in bed, with nasal cannula, easy respirations.   HEENT: PERRL, MMM, difficult to ascertain back of posterior oropharynx secondary to high MP score. Boggy red nasal turbinates bilaterally, TM exam- bulging white purulent appearance bilaterally, right >left, also with some external hemorrhage on right TM.    Cardiovascular: Bradycardic and distant, no m/r/g; 2+ radial pulses.   Pulmonary: Diffuse exp wheezes bilaterally   Gastrointestinal: NBS, soft, ND, NT  Neurologic: baseline- Left facial droop, right ptosis, decreased left shoulder shrug, left SCM strength. Otherwise all other CN's intact. Decreased left arm and leg strength, 4/5.   Skin: No rashes      Recent Lab, Micro, and Imaging Studies   Personally reviewed and notable for:    Recent Results (from the past 24 hour(s))   TROPONIN T    Collection Time     09/10/13 10:01 AM       Result Value Range    Troponin T <0.01  0.00 - 0.02 ng/mL   NT-PRO BNP    Collection Time     09/10/13 10:01 AM       Result Value Range    NT-pro BNP 70  0 - 900 pg/mL   BASIC  METABOLIC PANEL    Collection Time     09/11/13  3:30 AM       Result Value Range    Glucose 106 (*) 60 - 99 mg/dL    Sodium 960143  454133 - 098145 mmol/L    Potassium 3.7  3.3 - 5.1 mmol/L    Chloride 109 (*) 96 - 108 mmol/L    CO2 22  20 - 28 mmol/L    Anion Gap 12  7 - 16    UN 9  6 - 20 mg/dL    Creatinine 1.191.28 (*) 0.67 - 1.17 mg/dL    GFR,Caucasian 62      GFR,Black 71      Calcium 7.9 (*) 8.6 - 10.2 mg/dL   CBC AND DIFFERENTIAL    Collection Time     09/11/13  3:30 AM       Result Value Range    WBC 8.3  4.2 - 9.1 THOU/uL    RBC 4.2 (*) 4.6 - 6.1 MIL/uL  Hemoglobin 12.4 (*) 13.7 - 17.5 g/dL    Hematocrit 38 (*) 40 - 51 %    MCV 90  79 - 92 fL    MCH 29  26 - 32 pg/cell    MCHC 33  32 - 37 g/dL    RDW 16.1 (*) 09.6 - 14.4 %    Platelets 186  150 - 330 THOU/uL    Seg Neut % 65.8  34.0 - 67.9 %    Lymphocyte % 22.4  21.8 - 53.1 %    Monocyte % 11.4  5.3 - 12.2 %    Eosinophil % 0.0 (*) 0.8 - 7.0 %    Basophil % 0.4  0.2 - 1.2 %    Neut # K/uL 5.4  1.8 - 5.4 THOU/uL    Lymph # K/uL 1.9  1.3 - 3.6 THOU/uL    Mono # K/uL 0.9 (*) 0.3 - 0.8 THOU/uL    Eos # K/uL 0.0  0.0 - 0.5 THOU/uL    Baso # K/uL 0.0  0.0 - 0.1 THOU/uL         *chest Standard Frontal And Lateral Views    09/09/2013   IMPRESSION:   No acute cardiopulmonary disease.   END REPORT     I have personally reviewed the image(s) and the resident's  interpretation and agree with or edited the findings.      Assessment     This is a case of fevers/chills/SOB + CP in a 58 yo M history of COPD, CVA, h/o MI, chronic back pain, prior tobacco use; found to be RSV positive, treating for COPD exacerbation.       Plan     URI symptoms, otitis media.   - infected appearance of ears on physical exam  - zyrtec for watery, itchy eyes  - mucinex  - Unasyn IV for otitis media, day 1 of 7.     RSV, hx of COPD   - pO prednisone 40 mg daily; day 3 of 6.  Consider increasing dose of steroids given refractory symptoms.   - duonebs qid   - albuterol prn   - symbicort BID (takes at  home)     Syncope - sounds orthostatic in nature likely related to dehydration   - EKG without significant changes   - telemetry for 48 hours- bradycardia to the high 40s, 50s, but otherwise no events thus far. Will d/c  - patient with no criteria for CT head via [facility], did not hit head, will defer imaging.     Chest pain- patient endorses remote history of MI resulting with sudden cardiac arrest requiring CPR, isnt sure if he has CAD. Patient endorses cocaine induced CP at [facility] prior.   - troponin negative  - pt has cardiologist outpatient, can followup with.   - tylenol PRN, pt with pruritis with dilaudid, will trial a dose given CKD, if tolerates can continue dilaudid pO for breakthrough pain.     AKI - Resolving. creatinine 2 on admit, baseline from [facility] is 1.3-1.5; this AM of 1.2   - prerenal AKI- FeNa of 0.3%   - trend with daily BMP   - NS at 125 ml/hr; will d/c, encourage pO.    CVA   - home clopidogrel 75 mg daily     GERD   - omeprazole at home --> pantoprazole 40 mg daily     HTN BP meds initially held for hypotension, resolved, now with SBP in the 130s.   -  home BP meds- restarted Lisinopril 5mg , amlodipine 5mg   - hold home HCTZ 25mg     F pO  E daily  N regular   CODE STATUS: FULL                  Demaris Callander, MD on 09/11/2013 at 6:19 AM  Pager [phone]    Attending note.    Patient seen and examined personally. Reviewed above note by Dr. Diana Eves . Agree with exam and plan with following comments:    Patient is feeling better.  Lungs sound improved but still has wheezes all zones  Had severe headache in the morning with congestion in nose and started on unasyn IV for possible acute sinusitis  Will change to PO augmentin from tomorrow if feels better in am.  Also cont other treatment.  Follow BP on current medicines.  If stable home in 1-2 days.

## 2013-09-12 LAB — BASIC METABOLIC PANEL
Anion Gap: 10 (ref 7–16)
CO2: 25 mmol/L (ref 20–28)
Calcium: 8.3 mg/dL — ABNORMAL LOW (ref 8.6–10.2)
Chloride: 106 mmol/L (ref 96–108)
Creatinine: 1.35 mg/dL — ABNORMAL HIGH (ref 0.67–1.17)
GFR,Black: 67 *
GFR,Caucasian: 58 * — AB
Glucose: 114 mg/dL — ABNORMAL HIGH (ref 60–99)
Lab: 11 mg/dL (ref 6–20)
Potassium: 3.6 mmol/L (ref 3.3–5.1)
Sodium: 141 mmol/L (ref 133–145)

## 2013-09-12 LAB — CBC AND DIFFERENTIAL
Baso # K/uL: 0 10*3/uL (ref 0.0–0.1)
Basophil %: 0.4 % (ref 0.2–1.2)
Eos # K/uL: 0 10*3/uL (ref 0.0–0.5)
Eosinophil %: 0.1 % — ABNORMAL LOW (ref 0.8–7.0)
Hematocrit: 39 % — ABNORMAL LOW (ref 40–51)
Hemoglobin: 12.7 g/dL — ABNORMAL LOW (ref 13.7–17.5)
Lymph # K/uL: 2.4 10*3/uL (ref 1.3–3.6)
Lymphocyte %: 30.8 % (ref 21.8–53.1)
MCH: 30 pg/cell (ref 26–32)
MCHC: 33 g/dL (ref 32–37)
MCV: 90 fL (ref 79–92)
Mono # K/uL: 0.8 10*3/uL (ref 0.3–0.8)
Monocyte %: 9.9 % (ref 5.3–12.2)
Neut # K/uL: 4.6 10*3/uL (ref 1.8–5.4)
Platelets: 189 10*3/uL (ref 150–330)
RBC: 4.3 MIL/uL — ABNORMAL LOW (ref 4.6–6.1)
RDW: 14.9 % — ABNORMAL HIGH (ref 11.6–14.4)
Seg Neut %: 58.8 % (ref 34.0–67.9)
WBC: 7.9 10*3/uL (ref 4.2–9.1)

## 2013-09-12 MED ORDER — AMLODIPINE BESYLATE 10 MG PO TABS *I*
10.0000 mg | ORAL_TABLET | Freq: Every day | ORAL | Status: DC
Start: 2013-09-12 — End: 2013-09-15
  Administered 2013-09-12 – 2013-09-15 (×4): 10 mg via ORAL
  Filled 2013-09-12 (×4): qty 1

## 2013-09-12 MED ORDER — HYDROMORPHONE HCL 2 MG PO TABS *I*
1.0000 mg | ORAL_TABLET | Freq: Once | ORAL | Status: AC
Start: 2013-09-12 — End: 2013-09-12
  Administered 2013-09-12: 1 mg via ORAL
  Filled 2013-09-12: qty 1

## 2013-09-12 MED ORDER — POLYETHYLENE GLYCOL 3350 PO PACK 17 GM *I*
17.0000 g | PACK | Freq: Every day | ORAL | Status: DC
Start: 2013-09-13 — End: 2013-09-13

## 2013-09-12 MED ORDER — BENZONATATE 100 MG PO CAPS *I*
100.0000 mg | ORAL_CAPSULE | Freq: Three times a day (TID) | ORAL | Status: DC | PRN
Start: 2013-09-12 — End: 2013-09-15
  Administered 2013-09-12: 100 mg via ORAL
  Filled 2013-09-12 (×8): qty 1

## 2013-09-12 MED ORDER — POLYETHYLENE GLYCOL 3350 PO 8.5G *I*
8.5000 g | Freq: Every day | ORAL | Status: DC
Start: 2013-09-12 — End: 2013-09-12
  Administered 2013-09-12: 8.5 g via ORAL
  Filled 2013-09-12: qty 8.5

## 2013-09-12 MED ORDER — DOCUSATE SODIUM 100 MG PO CAPS *I*
100.0000 mg | ORAL_CAPSULE | Freq: Two times a day (BID) | ORAL | Status: DC
Start: 2013-09-12 — End: 2013-09-15
  Administered 2013-09-12 – 2013-09-15 (×7): 100 mg via ORAL
  Filled 2013-09-12 (×8): qty 1

## 2013-09-12 MED ORDER — PREDNISONE 50 MG PO TABS *I*
60.0000 mg | ORAL_TABLET | Freq: Every day | ORAL | Status: DC
Start: 2013-09-13 — End: 2013-09-13
  Filled 2013-09-12: qty 1

## 2013-09-12 MED ORDER — POLYETHYLENE GLYCOL 3350 PO 8.5G *I*
8.5000 g | Freq: Once | ORAL | Status: AC
Start: 2013-09-12 — End: 2013-09-12
  Administered 2013-09-12: 8.5 g via ORAL
  Filled 2013-09-12: qty 8.5

## 2013-09-12 MED ORDER — PANTOPRAZOLE SODIUM 40 MG PO TBEC *I*
40.0000 mg | DELAYED_RELEASE_TABLET | Freq: Two times a day (BID) | ORAL | Status: DC
Start: 2013-09-12 — End: 2013-09-14
  Administered 2013-09-12 – 2013-09-14 (×4): 40 mg via ORAL
  Filled 2013-09-12 (×6): qty 1

## 2013-09-12 MED ORDER — SENNOSIDES 8.6 MG PO TABS *I*
1.0000 | ORAL_TABLET | Freq: Every day | ORAL | Status: DC
Start: 2013-09-12 — End: 2013-09-15
  Administered 2013-09-12 – 2013-09-15 (×4): 1 via ORAL
  Filled 2013-09-12 (×4): qty 1

## 2013-09-12 MED ORDER — SORBITOL 70 % PO SOLN *WRAPPED*
60.0000 mL | Freq: Once | ORAL | Status: DC
Start: 2013-09-12 — End: 2013-09-12

## 2013-09-12 NOTE — Progress Notes (Signed)
Pt ambulated around nurses station with 2L O2, sat was 97%.  Pt took second lap around without O2, sat 95-97%. Pt stopped 2x along the way to rest and catch his breath.  Tolerated fairly well.  Will continue to monitor.

## 2013-09-12 NOTE — Student Note (Signed)
[facility] Medicine Progress Note                                          Significant 24 Hour Events      Pt called yesterday with tooth pain and tongue swelling, but his exam did not reveal any tongue swelling or signs of infection. He was asking for more dilaudid for headache and chronic back pain.    Subjective      Nathan Ochoa says that he is still not doing any better this morning. He continues to be very short of breath even with sitting up in bed and walking to the bathroom, which is unusual for him. He also has tightness in his chest that he thinks is from his COPD. He feels that his wheezing has not improved with the nebulizer treatments, and that they just make his throat sore. Additionally, he has a headache from congestion but feels that his ear pain has improved somewhat. He has been able to tolerate food and liquids without nausea or vomiting, but has not had a BM since admission. He was also asking for pain medications for his chronic back pain, which he has had since his back surgery in 1999.    Objective      Physical Exam  Vitals: BP 141/92  Pulse 61  Temp(Src) 36.3 C (97.3 F) (Temporal)  Resp 20  Ht 1.829 m (6' 0.01")  Wt 136.079 kg (300 lb)  BMI 40.68 kg/m2  SpO2 98%      I/O:     Intake/Output Summary (Last 24 hours) at 09/12/13 0644  Last data filed at 09/12/13 0411   Gross per 24 hour   Intake    880 ml   Output    225 ml   Net    655 ml       Constitutional: Irritable and distressed, tachypneic. Patient appears his age.  HEENT: Normocephalic. Atraumatic. PERRL bilaterally. No conjunctival injection. External ears normal. Tympanic membranes appeared inflamed, bulging, and purulent bilaterally. No oropharyngeal lesions or exudates. Moist mucous membranes. Supple neck without any lymphadenopathy. No masses.  Cardiovascular: Distant heart sounds. RRR, normal S1/S2, no murmurs, rubs, or gallops.  Respiratory:  Increased work of breathing with accessory muscle use. Still  with diffuse expiratory wheezes bilaterally throughout all lung fields, slightly improved from yesterday.  Abdomen: Soft, nondistended. Some suprapubic and epigastric tenderness to light and deep palpation. No hepatosplenomegaly. No masses  Extremities:  Warm. No erythema or edema in extremities.  Skin:  No rashes evident.    Labs:  Recent Results (from the past 24 hour(s))   BASIC METABOLIC PANEL    Collection Time     09/12/13  4:30 AM       Result Value Range    Glucose 114 (*) 60 - 99 mg/dL    Sodium 141  133 - 145 mmol/L    Potassium 3.6  3.3 - 5.1 mmol/L    Chloride 106  96 - 108 mmol/L    CO2 25  20 - 28 mmol/L    Anion Gap 10  7 - 16    UN 11  6 - 20 mg/dL    Creatinine 1.35 (*) 0.67 - 1.17 mg/dL    GFR,Caucasian 58 (*)     GFR,Black 67      Calcium 8.3 (*) 8.6 - 10.2 mg/dL   CBC  AND DIFFERENTIAL    Collection Time     09/12/13  4:30 AM       Result Value Range    WBC 7.9  4.2 - 9.1 THOU/uL    RBC 4.3 (*) 4.6 - 6.1 MIL/uL    Hemoglobin 12.7 (*) 13.7 - 17.5 g/dL    Hematocrit 39 (*) 40 - 51 %    MCV 90  79 - 92 fL    MCH 30  26 - 32 pg/cell    MCHC 33  32 - 37 g/dL    RDW 14.9 (*) 11.6 - 14.4 %    Platelets 189  150 - 330 THOU/uL    Seg Neut % 58.8  34.0 - 67.9 %    Lymphocyte % 30.8  21.8 - 53.1 %    Monocyte % 9.9  5.3 - 12.2 %    Eosinophil % 0.1 (*) 0.8 - 7.0 %    Basophil % 0.4  0.2 - 1.2 %    Neut # K/uL 4.6  1.8 - 5.4 THOU/uL    Lymph # K/uL 2.4  1.3 - 3.6 THOU/uL    Mono # K/uL 0.8  0.3 - 0.8 THOU/uL    Eos # K/uL 0.0  0.0 - 0.5 THOU/uL    Baso # K/uL 0.0  0.0 - 0.1 THOU/uL         Imaging:  09/09/2013 2:42 PM   CHEST FRONTAL AND LAT     IMPRESSION:   No acute cardiopulmonary disease.    Assessment     This is a case of chest pain, SOB, cough, fever, and chills in a 58 yo man with a PMH significant for HTN, HLD, COPD, CVA x2, and bradycardia s/p pacemaker placement. Given his history of cocaine abuse, his initial chest pain upon presentation to [facility] may have been caused by a supply-mediated ischemic  event due to vasospasm from cocaine. He most likely subsequently contracted RSV bronchiolitis that caused an exacerbation of his existing COPD, leading to his SOB, dry cough, chills, and fever. His current chest pain is most likely due to costochondritis from his coughing fits, but could also be due to angina or an ischemic event. His episodes of syncope were most likely due to dehydration as a result of poor PO intake and multiple episodes of vomiting, leading to orthostatic hypotension. This is consistent with his resulting prerenal AKI (due to a FeNa of <1%), which has improved after multiple fluid boluses. A less likely possibility for the syncopal episodes is another CVA, however he does not appear to have any new onset focal neurologic deficits on exam.     Plan     1. COPD exacerbation due to RSV bronchiolitis   -S/p 60 mg prednisone PO x1   -Day 4/5 of 5-day course of prednisone PO 40 mg   -Continue duonebs 4x/day   -Continue symbicort inhaler 2 puffs 3x/day   -Albuterol inhaler q4h PRN for wheezing and SOB   -Tylenol PRN for fever  -Chest physiotherapy  -Continue guaifenesin to help with secretions  -Can consider increasing dose of dilaudid for breakthrough pain    2. Episodes of syncope   -Received 2 L NS fluid boluses in ED  -Likely due to dehydration and orthostatic hypotension  -Discontinued mIVF  -EKG revealed only occasional irregular rhythm but otherwise normal sinus rhythm   -Can discontinue telemetry  -Consider head CT non-contrast to rule out CVA or intracerebral hemorrhage     3. Acute Kidney  Injury   -Resolving, creatinine improved  -Likely prerenal in origin as FeNA is <1% (0.3%)   -Continue to follow BMP and urine electrolytes   -Home antihypertensives held due to initial hypotension of 107/65. Restarted amlodipine 5 mg and lisinopril 5 mg.    4. History of CVA   -Continue clopidogrel 75 mg 1x/day     5. GERD   -Continue pantoprazole 40 mg 1x/day     6. HTN   -Continue amlodipine 5 mg 1x/day  and lisinopril 5 mg 1x/day  -Restart home hydrochlorothiazide today due to elevated BP overnight    7. Possible CHF   -Ruled out given BNP of 70  -Will need to follow-up with outpatient cardiologist    8. Chest pain   -Likely costochondritis  -MI or ischemic event seems unlikely given troponin T of <0.01    9. Bilateral otitis media  -Continue unasyn IV - day 2/7  -Phenol spray for sore throat  -Zyrtec for watery, itchy eyes        F:  none  E:  Daily BMP  N:  Encourage PO  DVT Prophylaxis:   lovenox    Code Status:  FULL CODE    Disposition: pending further stabilization over next 24-48 hrs    Nathan Ochoa, Nathan Ochoa, Pager [phone]  This is a Careers information officer note. Please see attending note for final assessment and plan.

## 2013-09-12 NOTE — Progress Notes (Addendum)
Hospital Medicine Progress Note                                                Significant 24 Hour Events      - no acute events overnight  - started on Unasyn for otitis media  - patient with several pain complaints yesterday- headache, toothache, chest pain, requesting dilaudid.     Subjective      - congestion symptoms and itchy eyes improving, still some sinus congestion.   - breathing feels tight and worse than prior  - complaining of substernal pain that worsens with inspiration and coughing    Objective      Physical Exam  Recent vital signs reviewed and notable for:  BP: (134-155)/(78-93)   Temp:  [36.3 C (97.3 F)-37.4 C (99.3 F)]   Temp src:  [-]   Heart Rate:  [61-73]   Resp:  [20]   SpO2:  [94 %-98 %]       General Constitutional: Obese barrel chested male lying in bed, with nasal cannula, easy respirations.   HEENT: PERRL, MMM, difficult to ascertain back of posterior oropharynx secondary to high MP score. Boggy red nasal turbinates bilaterally   Cardiovascular: Bradycardic and very distant, no m/r/g; 2+ radial pulses.   Pulmonary: Diffuse exp wheezes bilaterally- worse than yesterday   Gastrointestinal: NBS, soft, ND, NT   Neurologic: No new deficits  Skin: No rashes      Recent Lab, Micro, and Imaging Studies   Personally reviewed and notable for:    Recent Results (from the past 24 hour(s))   BASIC METABOLIC PANEL    Collection Time     09/12/13  4:30 AM       Result Value Range    Glucose 114 (*) 60 - 99 mg/dL    Sodium 161141  096133 - 045145 mmol/L    Potassium 3.6  3.3 - 5.1 mmol/L    Chloride 106  96 - 108 mmol/L    CO2 25  20 - 28 mmol/L    Anion Gap 10  7 - 16    UN 11  6 - 20 mg/dL    Creatinine 4.091.35 (*) 0.67 - 1.17 mg/dL    GFR,Caucasian 58 (*)     GFR,Black 67      Calcium 8.3 (*) 8.6 - 10.2 mg/dL   CBC AND DIFFERENTIAL    Collection Time     09/12/13  4:30 AM       Result Value Range    WBC 7.9  4.2 - 9.1 THOU/uL    RBC 4.3 (*) 4.6 - 6.1 MIL/uL    Hemoglobin 12.7 (*) 13.7 - 17.5 g/dL    Hematocrit 39 (*) 40 - 51 %    MCV 90  79 - 92 fL    MCH 30  26 - 32 pg/cell    MCHC 33  32 - 37 g/dL    RDW 81.114.9 (*) 91.411.6 - 14.4 %    Platelets 189  150 - 330 THOU/uL    Seg Neut % 58.8  34.0 - 67.9 %    Lymphocyte % 30.8  21.8 - 53.1 %    Monocyte % 9.9  5.3 - 12.2 %    Eosinophil % 0.1 (*) 0.8 - 7.0 %    Basophil % 0.4  0.2 - 1.2 %  Neut # K/uL 4.6  1.8 - 5.4 THOU/uL    Lymph # K/uL 2.4  1.3 - 3.6 THOU/uL    Mono # K/uL 0.8  0.3 - 0.8 THOU/uL    Eos # K/uL 0.0  0.0 - 0.5 THOU/uL    Baso # K/uL 0.0  0.0 - 0.1 THOU/uL         *chest Standard Frontal And Lateral Views    09/09/2013   IMPRESSION:   No acute cardiopulmonary disease.   END REPORT     I have personally reviewed the image(s) and the resident's  interpretation and agree with or edited the findings.      Assessment     This is a case of fevers/chills/SOB + CP in a 58 yo M history of COPD, CVA, h/o MI, chronic back pain, prior tobacco use; found to be RSV positive, treating for COPD exacerbation, also with superimposed otitis media- treating with Unasyn.     Plan     URI symptoms, otitis media.   - infected appearance of ears on physical exam   - zyrtec for watery, itchy eyes   - mucinex   - Unasyn IV for otitis media, day 2 of 7; will continue for now, switch to Augmentin when clinicallly improved     RSV, hx of COPD   - pO prednisone 40 mg daily; day 4 of 6; pt without resolving symptoms, consider increasing dose/switch to IV   - duonebs qid   - albuterol prn   - symbicort BID (takes at home)     Syncope - sounds orthostatic in nature likely related to dehydration   - EKG without significant changes   - telemetry for 48 hours- bradycardia to the high 40s, 50s, but otherwise no events, d/c'd   - patient with no criteria for CT head via [facility], did not hit head, will defer imaging.     Chest pain- patient endorses remote history of MI resulting with sudden cardiac arrest requiring CPR, isnt sure if he has CAD. Patient endorses cocaine induced CP at [facility]  prior.   - troponin negative   - pt has cardiologist outpatient, can followup with.   - tylenol PRN, pt with prior pruritis with high dose dilaudid, received yesterday with good response, page provider for breakthrough pain.      AKI on CKD - Resolving. creatinine 2 on admit, baseline from [facility] is 1.3-1.5; this AM of 1.35   - prerenal AKI- FeNa of 0.3%   - trend with daily BMP   - encourage pO fluids.     CVA   - home clopidogrel 75 mg daily     GERD   - omeprazole at home --> pantoprazole 40 mg daily     HTN BP meds initially held for hypotension, resolved, now with SBP in the 130s to  150s.   - home BP meds- restarted Lisinopril 5mg , increase amlodipine to 10mg  today.  - hold home HCTZ 25mg      F pO  E daily  N regular   CODE STATUS: FULL                  Demaris Callander, MD on 09/12/2013 at 6:14 AM  Pager [phone]    Attending note.    Patient seen and examined personally. Reviewed above note by Dr. Diana Eves . Agree with exam and plan with following comments:    Patient was wheezing in am. He reports more wheezing in the morning than later  on.  Also has nasal stuffiness.  Will start nasacort nasal spray for sinusitis  Cont unsyn today. Change to augmentin in am.  May change prednisone to 60mg  daily.  Add protonix 40mg  bid for GERD.  Cont other treatment. to

## 2013-09-12 NOTE — Plan of Care (Signed)
Problem: Ineffective Airway Clearance (Aspiration Precautions)  Goal: Patient will maintain patent airway  Outcome: Maintaining  Pt sits upright to eat or drink.

## 2013-09-13 LAB — CBC AND DIFFERENTIAL
Baso # K/uL: 0 10*3/uL (ref 0.0–0.1)
Basophil %: 0 % (ref 0.2–1.2)
Eos # K/uL: 0 10*3/uL (ref 0.0–0.5)
Eosinophil %: 0 % — ABNORMAL LOW (ref 0.8–7.0)
Hematocrit: 41 % (ref 40–51)
Hemoglobin: 13.6 g/dL — ABNORMAL LOW (ref 13.7–17.5)
Lymph # K/uL: 4.1 10*3/uL — ABNORMAL HIGH (ref 1.3–3.6)
Lymphocyte %: 34.2 % (ref 21.8–53.1)
MCH: 30 pg/cell (ref 26–32)
MCHC: 33 g/dL (ref 32–37)
MCV: 91 fL (ref 79–92)
Mono # K/uL: 0.5 10*3/uL (ref 0.3–0.8)
Monocyte %: 4.3 % — ABNORMAL LOW (ref 5.3–12.2)
Neut # K/uL: 6.6 10*3/uL — ABNORMAL HIGH (ref 1.8–5.4)
Nucl RBC # K/uL: 0.2 10*3/uL
Nucl RBC %: 1.7 /100 WBC — ABNORMAL HIGH (ref 0.0–0.2)
Platelets: 216 10*3/uL (ref 150–330)
RBC: 4.5 MIL/uL — ABNORMAL LOW (ref 4.6–6.1)
RDW: 14.9 % — ABNORMAL HIGH (ref 11.6–14.4)
Seg Neut %: 59 % (ref 34.0–67.9)
WBC: 11.3 10*3/uL — ABNORMAL HIGH (ref 4.2–9.1)

## 2013-09-13 LAB — BASIC METABOLIC PANEL
Anion Gap: 10 (ref 7–16)
CO2: 26 mmol/L (ref 20–28)
Calcium: 8.3 mg/dL — ABNORMAL LOW (ref 8.6–10.2)
Chloride: 105 mmol/L (ref 96–108)
Creatinine: 1.26 mg/dL — ABNORMAL HIGH (ref 0.67–1.17)
GFR,Black: 73 *
GFR,Caucasian: 63 *
Glucose: 96 mg/dL (ref 60–99)
Lab: 12 mg/dL (ref 6–20)
Potassium: 3.8 mmol/L (ref 3.3–5.1)
Sodium: 141 mmol/L (ref 133–145)

## 2013-09-13 LAB — DIFF BASED ON: Diff Based On: 117 CELLS

## 2013-09-13 LAB — GIANT PLATELETS

## 2013-09-13 LAB — REACTIVE LYMPHS: React Lymph %: 3 % (ref 0–6)

## 2013-09-13 LAB — MANUAL DIFFERENTIAL

## 2013-09-13 LAB — MISC. CELL %: Misc. Cell %: 0 % (ref 0–0)

## 2013-09-13 MED ORDER — CITRATE OF MAGNESIA PO SOLN *I*
150.0000 mL | Freq: Once | ORAL | Status: AC
Start: 2013-09-13 — End: 2013-09-13
  Administered 2013-09-13: 150 mL via ORAL

## 2013-09-13 MED ORDER — AMOXICILLIN-POT CLAVULANATE 875-125 MG PO TABS *I*
1.0000 | ORAL_TABLET | Freq: Two times a day (BID) | ORAL | Status: DC
Start: 2013-09-13 — End: 2013-09-15
  Administered 2013-09-13 – 2013-09-15 (×4): 1 via ORAL
  Filled 2013-09-13 (×5): qty 1

## 2013-09-13 MED ORDER — HYDROMORPHONE HCL 2 MG PO TABS *I*
1.0000 mg | ORAL_TABLET | ORAL | Status: DC | PRN
Start: 2013-09-13 — End: 2013-09-14
  Administered 2013-09-13 – 2013-09-14 (×4): 1 mg via ORAL
  Filled 2013-09-13 (×4): qty 1

## 2013-09-13 MED ORDER — CITRATE OF MAGNESIA PO SOLN *I*
150.0000 mL | ORAL | Status: AC | PRN
Start: 2013-09-13 — End: 2013-09-15
  Administered 2013-09-13: 150 mL via ORAL

## 2013-09-13 MED ORDER — PHENYLEPHRINE HCL 0.25 % NA SOLN *I*
1.0000 | Freq: Four times a day (QID) | NASAL | Status: DC | PRN
Start: 2013-09-13 — End: 2013-09-15
  Filled 2013-09-13: qty 40

## 2013-09-13 MED ORDER — METHYLPREDNISOLONE SOD SUCC 40 MG IJ SOLR(40 MG/ML) *WRAPPED*
40.0000 mg | Freq: Four times a day (QID) | INTRAMUSCULAR | Status: DC
Start: 2013-09-13 — End: 2013-09-13
  Administered 2013-09-13: 40 mg via INTRAVENOUS
  Filled 2013-09-13 (×5): qty 1

## 2013-09-13 MED ORDER — SALINE NASAL SPRAY 0.65 % NA SOLN *WRAPPED*
2.0000 | NASAL | Status: DC | PRN
Start: 2013-09-13 — End: 2013-09-15
  Administered 2013-09-14: 2 via NASAL
  Filled 2013-09-13: qty 44

## 2013-09-13 MED ORDER — POLYETHYLENE GLYCOL 3350 PO PACK 17 GM *I*
17.0000 g | PACK | Freq: Two times a day (BID) | ORAL | Status: DC
Start: 2013-09-13 — End: 2013-09-15
  Administered 2013-09-13 – 2013-09-14 (×3): 17 g via ORAL
  Filled 2013-09-13 (×5): qty 17

## 2013-09-13 MED ORDER — LISINOPRIL 10 MG PO TABS *I*
10.0000 mg | ORAL_TABLET | Freq: Every day | ORAL | Status: DC
Start: 2013-09-13 — End: 2013-09-15
  Administered 2013-09-13 – 2013-09-15 (×3): 10 mg via ORAL
  Filled 2013-09-13 (×4): qty 1

## 2013-09-13 MED ORDER — PREDNISONE 50 MG PO TABS *I*
60.0000 mg | ORAL_TABLET | Freq: Every day | ORAL | Status: DC
Start: 2013-09-14 — End: 2013-09-15
  Administered 2013-09-14 – 2013-09-15 (×2): 60 mg via ORAL
  Filled 2013-09-13 (×2): qty 1

## 2013-09-13 MED ORDER — METHYLPREDNISOLONE SOD SUCC 40 MG IJ SOLR(40 MG/ML) *WRAPPED*
40.0000 mg | Freq: Once | INTRAMUSCULAR | Status: AC
Start: 2013-09-13 — End: 2013-09-13
  Administered 2013-09-13: 40 mg via INTRAVENOUS
  Filled 2013-09-13: qty 1

## 2013-09-13 NOTE — Plan of Care (Signed)
Problem: Bowel Elimination  Goal: Elimination patterns are normal or improving  Outcome: Goal not met  Pt states he hasn't had a bowel movement in 5 days- he seems concerned about this and very interested in taking his bowel meds

## 2013-09-13 NOTE — Progress Notes (Addendum)
Hospital Medicine Progress Note                                                Significant 24 Hour Events      - patient complaining of worsening SOB overnight, felt poorly, was examined by cross cover, patient with diffuse and severe wheezing, complaining of chest tightness, was given IV methylprednisone 40mg  and duonebs overnight.     Subjective      - wheezing feels worse than prior more SOB. Just finished duonebs, states he feels no improvement   - congestion slowly improving, still with aural fullness  - constipated, requesting laxative  - chronic headache and toothpain, put on pO dilaudid, states improvement with this    Objective      Physical Exam  Recent vital signs reviewed and notable for:  BP: (133-162)/(77-88)   Temp:  [36.3 C (97.3 F)-36.9 C (98.4 F)]   Temp src:  [-]   Heart Rate:  [60-67]   Resp:  [20]   SpO2:  [94 %-97 %]       General Constitutional: Obese barrel chested male sitting in bed with nasal cannula  HEENT: PERRL, MMM, nonerytghematous oropharynx. Cardiovascular: Difficult to hear secondary to wheezing no m/r/g; 2+ radial pulses.   Pulmonary: Diffuse exp wheezes bilaterally in all lung fields.   Gastrointestinal: NBS, soft, ND, slightly tender   Neurologic: No new deficits  Skin: No rashes       Recent Lab, Micro, and Imaging Studies   Personally reviewed and notable for:    Recent Results (from the past 24 hour(s))   BASIC METABOLIC PANEL    Collection Time     09/13/13  2:29 AM       Result Value Range    Glucose 96  60 - 99 mg/dL    Sodium 621141  308133 - 657145 mmol/L    Potassium 3.8  3.3 - 5.1 mmol/L    Chloride 105  96 - 108 mmol/L    CO2 26  20 - 28 mmol/L    Anion Gap 10  7 - 16    UN 12  6 - 20 mg/dL    Creatinine 8.461.26 (*) 0.67 - 1.17 mg/dL    GFR,Caucasian 63      GFR,Black 73      Calcium 8.3 (*) 8.6 - 10.2 mg/dL   CBC AND DIFFERENTIAL    Collection Time     09/13/13  2:29 AM       Result Value Range    WBC 11.3 (*) 4.2 - 9.1 THOU/uL    RBC 4.5 (*) 4.6 - 6.1 MIL/uL    Hemoglobin 13.6 (*) 13.7 - 17.5 g/dL    Hematocrit 41  40 - 51 %    MCV 91  79 - 92 fL    MCH 30  26 - 32 pg/cell    MCHC 33  32 - 37 g/dL    RDW 96.214.9 (*) 95.211.6 - 14.4 %    Platelets 216  150 - 330 THOU/uL    Seg Neut % 59.0  34.0 - 67.9 %    Lymphocyte % 34.2  21.8 - 53.1 %    Monocyte % 4.3 (*) 5.3 - 12.2 %    Eosinophil % 0.0 (*) 0.8 - 7.0 %    Basophil % 0.0  0.2 -  1.2 %    Neut # K/uL 6.6 (*) 1.8 - 5.4 THOU/uL    Lymph # K/uL 4.1 (*) 1.3 - 3.6 THOU/uL    Mono # K/uL 0.5  0.3 - 0.8 THOU/uL    Eos # K/uL 0.0  0.0 - 0.5 THOU/uL    Baso # K/uL 0.0  0.0 - 0.1 THOU/uL    Nucl RBC % 1.7 (*) 0.0 - 0.2 /100 WBC    Nucl RBC # K/uL 0.2     REACTIVE LYMPHS    Collection Time     09/13/13  2:29 AM       Result Value Range    React Lymph % 3  0 - 6 %   MISC. CELL %    Collection Time     09/13/13  2:29 AM       Result Value Range    Misc. Cell % 0  0 - 0 %   GIANT PLATELETS    Collection Time     09/13/13  2:29 AM       Result Value Range    Giant PLTs Present     DIFF BASED ON    Collection Time     09/13/13  2:29 AM       Result Value Range    Diff Based On 117     MANUAL DIFFERENTIAL    Collection Time     09/13/13  2:29 AM       Result Value Range    Manual DIFF RESULTS           No results found.    Assessment     This is a case of fevers/chills/SOB + CP in a 58 yo M history of COPD, CVA, h/o MI, chronic back pain, prior tobacco use; found to be RSV positive, treating for COPD exacerbation- not improving on oral steroids, also with superimposed otitis media- treating with Unasyn.     Plan     URI symptoms, otitis media.   - infected appearance of ears on physical exam   - zyrtec for watery, itchy eyes   - mucinex   - Unasyn IV for otitis media, day 3 of 7; will continue for now, switch to Augmentin when clinicallly improved     RSV, hx of COPD   - pO prednisone increased to 60 mg daily yesterday; completed 4 days oral without any improvement in symptoms, started on methylprednisolone IV 40mg  q6h.  - duonebs qid   -  albuterol prn   - symbicort BID (takes at home)     Syncope - sounds orthostatic in nature likely related to dehydration   - EKG without significant changes   - telemetry for 48 hours- bradycardia to the high 40s, 50s, but otherwise no events, d/c'd   - patient with no criteria for CT head via [facility], did not hit head, will defer imaging.     Chest pain- patient endorses remote history of MI resulting with sudden cardiac arrest requiring CPR, isnt sure if he has CAD. Patient endorses cocaine induced CP at [facility] prior.   - troponin negative   - pt has cardiologist outpatient, can followup with.   - tylenol PRN, pt with prior pruritis with high dose dilaudid, received yesterday with good response, page provider for breakthrough pain.     AKI on CKD - Resolving. creatinine 2 on admit, baseline from [facility] is 1.3-1.5; this AM of 1.35   - prerenal AKI- FeNa of 0.3%   -  trend with daily BMP   - encourage pO fluids.     CVA   - home clopidogrel 75 mg daily     GERD   - omeprazole at home --> pantoprazole 40 mg BID (could be contributing to cough)    Constipation  - miralax, senna, colace  - Mg citrate PRN    HTN BP meds initially held for hypotension, resolved, now with SBP in the 160s   - home BP meds- increasing Lisinopril to 10mg , amlodipine at 10mg  daily.   - hold home HCTZ 25mg      F pO  E daily  N regular   CODE STATUS: FULL                  Nathan Callander, MD on 09/13/2013 at 6:02 AM  Pager [phone]    Attending note.    Patient seen and examined personally. Reviewed above note by Dr. Diana Eves . Agree with exam and plan with following comments:  Overnight events reviewed.  Patient is feeling better now but still has wheezes b/l  He reports hx of OSA, was prescribed c-pap  but refuses to use c-pap.  D/W patient that he should be using c-pap to feel better.  Also increased protonix to bid yesterday to help his GERD contribution to overnight SOB.  Cont PO prednisone and will need to slowly taper once better.  May change to PO  augmentin to complete 14 days course.  Cont other treatment.  Home in 1-2 days.

## 2013-09-13 NOTE — Student Note (Signed)
[facility] Medicine Progress Note                                          Significant 24 Hour Events      Patient became short of breath overnight while sleeping that was not responsive to duonebs. He was given 40 mg of IV solumedrol that helped with the dyspnea.    Subjective      Mr. Wolz says that he is still short of breath and wheezing this morning, but that he does feel somewhat improved. He was able to walk around the unit yesterday with and without O2, and his sats were stable around 95-97%. He still has some chest tightness and is asking for dilaudid 2x/day for the morning and evening, when his pain is worst. He has also not yet had a BM, despite the bowel regimen that was initiated yesterday, and says he needs more miralax. His R tooth continues to hurt when eating (he says he had a wisdom tooth extracted a few months ago and they left behind pieces of the tooth), and he wants a dentist to look at it while he is hospitalized.    Objective      Physical Exam  Vitals: BP 162/82  Pulse 65  Temp(Src) 36.5 C (97.7 F) (Temporal)  Resp 20  Ht 1.829 m (6' 0.01")  Wt 136.079 kg (300 lb)  BMI 40.68 kg/m2  SpO2 96%      I/O:     Intake/Output Summary (Last 24 hours) at 09/13/13 0637  Last data filed at 09/13/13 0430   Gross per 24 hour   Intake    360 ml   Output      1 ml   Net    359 ml       Constitutional: Patient appears tachypneic and short of breath. Resting in bed.  HEENT: Normocephalic. Atraumatic. PERRL bilaterally. No conjunctival injection. External ears normal. Tympanic membranes appeared inflamed, bulging, and purulent bilaterally. No oropharyngeal lesions or exudates. Moist mucous membranes. Supple neck without any lymphadenopathy. No masses.  Cardiovascular: Distant heart sounds. RRR, normal S1/S2, no murmurs, rubs, or gallops.  Respiratory:  Increased work of breathing with accessory muscle use. Still with diffuse expiratory wheezes bilaterally throughout all lung  fields.  Abdomen: Soft, nondistended. Some suprapubic and epigastric tenderness to light and deep palpation. No hepatosplenomegaly. No masses  Extremities:  Warm. No erythema or edema in extremities.  Skin:  No rashes evident.     Labs:  Recent Results (from the past 24 hour(s))   BASIC METABOLIC PANEL    Collection Time     09/13/13  2:29 AM       Result Value Range    Glucose 96  60 - 99 mg/dL    Sodium 141  133 - 145 mmol/L    Potassium 3.8  3.3 - 5.1 mmol/L    Chloride 105  96 - 108 mmol/L    CO2 26  20 - 28 mmol/L    Anion Gap 10  7 - 16    UN 12  6 - 20 mg/dL    Creatinine 1.26 (*) 0.67 - 1.17 mg/dL    GFR,Caucasian 63      GFR,Black 73      Calcium 8.3 (*) 8.6 - 10.2 mg/dL   CBC AND DIFFERENTIAL    Collection Time  09/13/13  2:29 AM       Result Value Range    WBC 11.3 (*) 4.2 - 9.1 THOU/uL    RBC 4.5 (*) 4.6 - 6.1 MIL/uL    Hemoglobin 13.6 (*) 13.7 - 17.5 g/dL    Hematocrit 41  40 - 51 %    MCV 91  79 - 92 fL    MCH 30  26 - 32 pg/cell    MCHC 33  32 - 37 g/dL    RDW 14.9 (*) 11.6 - 14.4 %    Platelets 216  150 - 330 THOU/uL    Seg Neut % 59.0  34.0 - 67.9 %    Lymphocyte % 34.2  21.8 - 53.1 %    Monocyte % 4.3 (*) 5.3 - 12.2 %    Eosinophil % 0.0 (*) 0.8 - 7.0 %    Basophil % 0.0  0.2 - 1.2 %    Neut # K/uL 6.6 (*) 1.8 - 5.4 THOU/uL    Lymph # K/uL 4.1 (*) 1.3 - 3.6 THOU/uL    Mono # K/uL 0.5  0.3 - 0.8 THOU/uL    Eos # K/uL 0.0  0.0 - 0.5 THOU/uL    Baso # K/uL 0.0  0.0 - 0.1 THOU/uL    Nucl RBC % 1.7 (*) 0.0 - 0.2 /100 WBC    Nucl RBC # K/uL 0.2     REACTIVE LYMPHS    Collection Time     09/13/13  2:29 AM       Result Value Range    React Lymph % 3  0 - 6 %   MISC. CELL %    Collection Time     09/13/13  2:29 AM       Result Value Range    Misc. Cell % 0  0 - 0 %   GIANT PLATELETS    Collection Time     09/13/13  2:29 AM       Result Value Range    Giant PLTs Present     DIFF BASED ON    Collection Time     09/13/13  2:29 AM       Result Value Range    Diff Based On 117     MANUAL DIFFERENTIAL     Collection Time     09/13/13  2:29 AM       Result Value Range    Manual DIFF RESULTS           Imaging:  09/09/2013 2:42 PM   CHEST FRONTAL AND LAT     IMPRESSION:   No acute cardiopulmonary disease.    Assessment     This is a case of chest pain, SOB, cough, fever, and chills in a 58 yo man with a PMH significant for HTN, HLD, COPD, CVA x2, and bradycardia s/p pacemaker placement. Given his history of cocaine abuse, his initial chest pain upon presentation to [facility] may have been caused by a supply-mediated ischemic event due to vasospasm from cocaine. He most likely subsequently contracted RSV bronchiolitis that caused an exacerbation of his existing COPD, leading to his SOB, dry cough, chills, and fever. His current chest pain is most likely due to costochondritis from his coughing fits, but could also be due to angina or an ischemic event. His episodes of syncope were most likely due to dehydration as a result of poor PO intake and multiple episodes of vomiting, leading to orthostatic hypotension. This is consistent  with his resulting prerenal AKI (due to a FeNa of <1%), which has improved after multiple fluid boluses. A less likely possibility for the syncopal episodes is another CVA, however he does not appear to have any new onset focal neurologic deficits on exam.     Plan     1. COPD exacerbation due to RSV bronchiolitis   -S/p 60 mg prednisone PO x1   -Completed 4 days of oral prednisone 40 mg. Continue IV solumedrol 40 mg for 72 hrs  -Continue duonebs 4x/day   -Continue symbicort inhaler 2 puffs 2x/day   -Albuterol inhaler q4h PRN for wheezing and SOB   -Tylenol PRN for fever  -Chest physiotherapy  -Continue guaifenesin to help with secretions  -Dilaudid 1 mg PO 2x/day for severe chest and back pain    2. Episodes of syncope   -Received 2 L NS fluid boluses in ED  -Likely due to dehydration and orthostatic hypotension  -Discontinued mIVF  -EKG revealed only occasional irregular rhythm but otherwise normal  sinus rhythm   -Can discontinue telemetry    3. Acute Kidney Injury   -Resolving, creatinine improved  -Likely prerenal in origin as FeNA was <1% (0.3%)   -Continue to follow BMP and urine electrolytes   -Home antihypertensives held due to initial hypotension of 107/65. Restarted amlodipine 5 mg and lisinopril 5 mg.    4. History of CVA   -Continue clopidogrel 75 mg 1x/day     5. GERD   -Continue pantoprazole 40 mg 1x/day     6. HTN   -Continue amlodipine 5 mg 1x/day and lisinopril 5 mg 1x/day  -Restart home hydrochlorothiazide today due to elevated BP overnight    7. Possible CHF   -Ruled out given BNP of 70  -Will need to follow-up with outpatient cardiologist    8. Chest pain   -Likely costochondritis  -MI or ischemic event seems unlikely given troponin T of <0.01  -Dilaudid PO 1 mg 2x/day PRN    9. Bilateral otitis media  -Completed 2 days of unasyn. Stop unasyn and switch to augmentin  -Phenol spray for sore throat  -Zyrtec for watery, itchy eyes    10. Constipation  -Continue senna 1 tablet/day, colace 100 mg 2x/day  -Increase dose of miralax powder    11. Tooth pain  -Consider inpatient dentistry consult today        F:  none  E:  Daily BMP  N:  Encourage PO  DVT Prophylaxis:   lovenox    Code Status:  FULL CODE    Disposition: pending further stabilization over next 24-48 hrs    Angela Burke, Revonda Humphrey, Pager [phone]  This is a Careers information officer note. Please see attending note for final assessment and plan.

## 2013-09-14 LAB — CBC AND DIFFERENTIAL
Baso # K/uL: 0 10*3/uL (ref 0.0–0.1)
Basophil %: 0 % (ref 0.2–1.2)
Eos # K/uL: 0 10*3/uL (ref 0.0–0.5)
Eosinophil %: 0 % — ABNORMAL LOW (ref 0.8–7.0)
Hematocrit: 43 % (ref 40–51)
Hemoglobin: 14.4 g/dL (ref 13.7–17.5)
Lymph # K/uL: 4.9 10*3/uL — ABNORMAL HIGH (ref 1.3–3.6)
Lymphocyte %: 26.1 % (ref 21.8–53.1)
MCH: 30 pg/cell (ref 26–32)
MCHC: 33 g/dL (ref 32–37)
MCV: 90 fL (ref 79–92)
Mono # K/uL: 0.6 10*3/uL (ref 0.3–0.8)
Monocyte %: 3.4 % — ABNORMAL LOW (ref 5.3–12.2)
Neut # K/uL: 11.1 10*3/uL — ABNORMAL HIGH (ref 1.8–5.4)
Platelets: 239 10*3/uL (ref 150–330)
RBC: 4.8 MIL/uL (ref 4.6–6.1)
RDW: 14.6 % — ABNORMAL HIGH (ref 11.6–14.4)
Seg Neut %: 67.2 % (ref 34.0–67.9)
WBC: 16.5 10*3/uL — ABNORMAL HIGH (ref 4.2–9.1)

## 2013-09-14 LAB — REACTIVE LYMPHS: React Lymph %: 3 % (ref 0–6)

## 2013-09-14 LAB — BASIC METABOLIC PANEL
Anion Gap: 17 — ABNORMAL HIGH (ref 7–16)
CO2: 22 mmol/L (ref 20–28)
Calcium: 9.1 mg/dL (ref 8.6–10.2)
Chloride: 101 mmol/L (ref 96–108)
Creatinine: 1.26 mg/dL — ABNORMAL HIGH (ref 0.67–1.17)
GFR,Black: 73 *
GFR,Caucasian: 63 *
Glucose: 148 mg/dL — ABNORMAL HIGH (ref 60–99)
Lab: 14 mg/dL (ref 6–20)
Potassium: 4.5 mmol/L (ref 3.3–5.1)
Sodium: 140 mmol/L (ref 133–145)

## 2013-09-14 LAB — GIANT PLATELETS

## 2013-09-14 LAB — MANUAL DIFFERENTIAL

## 2013-09-14 LAB — DIFF BASED ON: Diff Based On: 119 CELLS

## 2013-09-14 LAB — MISC. CELL %: Misc. Cell %: 0 % (ref 0–0)

## 2013-09-14 MED ORDER — AMOXICILLIN-POT CLAVULANATE 875-125 MG PO TABS *I*
1.0000 | ORAL_TABLET | Freq: Two times a day (BID) | ORAL | Status: AC
Start: 2013-09-14 — End: 2013-09-20

## 2013-09-14 MED ORDER — LISINOPRIL 10 MG PO TABS *I*
10.0000 mg | ORAL_TABLET | Freq: Every day | ORAL | Status: AC
Start: 2013-09-14 — End: ?

## 2013-09-14 MED ORDER — PREDNISONE 10 MG PO TABS *I*
ORAL_TABLET | ORAL | Status: AC
Start: 2013-09-14 — End: ?

## 2013-09-14 MED ORDER — AMLODIPINE BESYLATE 10 MG PO TABS *I*
10.0000 mg | ORAL_TABLET | Freq: Every day | ORAL | Status: AC
Start: 2013-09-14 — End: ?

## 2013-09-14 MED ORDER — GUAIFENESIN 600 MG PO TB12 *I*
600.0000 mg | ORAL_TABLET | Freq: Two times a day (BID) | ORAL | Status: AC
Start: 2013-09-14 — End: 2013-09-29

## 2013-09-14 MED ORDER — HYDROMORPHONE HCL 2 MG PO TABS *I*
1.0000 mg | ORAL_TABLET | Freq: Three times a day (TID) | ORAL | Status: AC | PRN
Start: 2013-09-14 — End: 2013-09-15
  Administered 2013-09-14 (×2): 1 mg via ORAL
  Filled 2013-09-14 (×3): qty 1

## 2013-09-14 MED ORDER — PANTOPRAZOLE SODIUM 40 MG PO TBEC *I*
40.0000 mg | DELAYED_RELEASE_TABLET | Freq: Two times a day (BID) | ORAL | Status: DC
Start: 2013-09-14 — End: 2013-09-15
  Administered 2013-09-14 – 2013-09-15 (×2): 40 mg via ORAL
  Filled 2013-09-14 (×3): qty 1

## 2013-09-14 MED ORDER — NICOTINE 14 MG/24HR TD PT24 *I*
1.0000 | MEDICATED_PATCH | Freq: Every day | TRANSDERMAL | Status: DC
Start: 2013-09-14 — End: 2013-09-15
  Administered 2013-09-14 – 2013-09-15 (×3): 1 via TRANSDERMAL
  Filled 2013-09-14 (×3): qty 1

## 2013-09-14 NOTE — Plan of Care (Signed)
Problem: Impaired Gas Exchange  Goal: Patient will maintain adequate oxygenation  Outcome: Maintaining  Pt is maintaining O2 sat effectively. See Doc flowsheets for details    Problem: Ineffective Breathing Pattern  Goal: Patient will achieve/maintain normal respiratory rate/effort  Outcome: Maintaining  Pt continues to become dyspneic with exertion

## 2013-09-14 NOTE — Progress Notes (Addendum)
Hospital Medicine Progress Note                                                Significant 24 Hour Events      - no acute events overnight    Subjective      - patient still complaining of wheezing and SOB, but feels better compared to admission  - still having productive cough, improving  - had blood nose yesterday, improved with nasal spray  - was able to sleep on his side, woke up feeling SOB, but also improving compared to admission  - States he wants to try walking around without oxygen today, tried yesterday while on oxygen and felt lightheaded.   - Patient agreeable to weaning off dilaudid today     Objective      Physical Exam  Recent vital signs reviewed and notable for:  BP: (115-146)/(56-85)   Temp:  [36.4 C (97.5 F)-36.8 C (98.2 F)]   Temp src:  [-]   Heart Rate:  [57-87]   Resp:  [20-22]   SpO2:  [94 %-97 %]       eneral Constitutional: Obese barrel chested male sitting in bed with nasal cannula   HEENT: PERRL, MMM, nonerythematous oropharynx. Cardiovascular: Difficult to hear secondary to wheezing no m/r/g; 2+ radial pulses.   Pulmonary: Diffuse exp wheezes bilaterally in all lung fields, improved compared to yesterday.   Gastrointestinal: NBS, soft, ND, slightly tender   Neurologic: No new deficits  Skin: No rashes      Recent Lab, Micro, and Imaging Studies   Personally reviewed and notable for:    Recent Results (from the past 24 hour(s))   BASIC METABOLIC PANEL    Collection Time     09/14/13  1:22 AM       Result Value Range    Glucose 148 (*) 60 - 99 mg/dL    Sodium 161140  096133 - 045145 mmol/L    Potassium 4.5  3.3 - 5.1 mmol/L    Chloride 101  96 - 108 mmol/L    CO2 22  20 - 28 mmol/L    Anion Gap 17 (*) 7 - 16    UN 14  6 - 20 mg/dL    Creatinine 4.091.26 (*) 0.67 - 1.17 mg/dL    GFR,Caucasian 63      GFR,Black 73      Calcium 9.1  8.6 - 10.2 mg/dL   CBC AND DIFFERENTIAL    Collection Time     09/14/13  1:22 AM       Result Value Range    WBC 16.5 (*) 4.2 - 9.1 THOU/uL    RBC 4.8  4.6 - 6.1  MIL/uL    Hemoglobin 14.4  13.7 - 17.5 g/dL    Hematocrit 43  40 - 51 %    MCV 90  79 - 92 fL    MCH 30  26 - 32 pg/cell    MCHC 33  32 - 37 g/dL    RDW 81.114.6 (*) 91.411.6 - 14.4 %    Platelets 239  150 - 330 THOU/uL    Seg Neut % 67.2  34.0 - 67.9 %    Lymphocyte % 26.1  21.8 - 53.1 %    Monocyte % 3.4 (*) 5.3 - 12.2 %    Eosinophil % 0.0 (*)  0.8 - 7.0 %    Basophil % 0.0  0.2 - 1.2 %    Neut # K/uL 11.1 (*) 1.8 - 5.4 THOU/uL    Lymph # K/uL 4.9 (*) 1.3 - 3.6 THOU/uL    Mono # K/uL 0.6  0.3 - 0.8 THOU/uL    Eos # K/uL 0.0  0.0 - 0.5 THOU/uL    Baso # K/uL 0.0  0.0 - 0.1 THOU/uL   REACTIVE LYMPHS    Collection Time     09/14/13  1:22 AM       Result Value Range    React Lymph % 3  0 - 6 %   MISC. CELL %    Collection Time     09/14/13  1:22 AM       Result Value Range    Misc. Cell % 0  0 - 0 %   GIANT PLATELETS    Collection Time     09/14/13  1:22 AM       Result Value Range    Giant PLTs Present     DIFF BASED ON    Collection Time     09/14/13  1:22 AM       Result Value Range    Diff Based On 119     MANUAL DIFFERENTIAL    Collection Time     09/14/13  1:22 AM       Result Value Range    Manual DIFF RESULTS           No results found.    Assessment     This is a case of fevers/chills/SOB + CP in a 58 yo M history of COPD, CVA, h/o MI, chronic back pain, prior tobacco use; found to be RSV positive, treating for COPD exacerbation- not improving on oral steroids, also with superimposed otitis media- treating with Unasyn.     Plan     URI symptoms, otitis media.   - infected appearance of ears on physical exam   - zyrtec for watery, itchy eyes   - mucinex   - Unasyn switched to Augmentin yesterday, day 4 of 7 for antibiotics.     RSV, hx of COPD   - pt currently on pO prednisone 60mg , day 6 of steroids  - duonebs qid   - albuterol prn   - symbicort BID  - needs ambulating pulse ox off oxygen    Syncope - sounds orthostatic in nature likely related to dehydration   - EKG without significant changes   - pO fluids      Chest pain- patient endorses remote history of MI resulting with sudden cardiac arrest requiring CPR, isnt sure if he has CAD. Patient endorses cocaine induced CP at [facility] prior.   - troponin negative   - pt has cardiologist outpatient appointment setup.   - tylenol PRN, pt on dilaudid pO 1mg  for breathrough, will wean off today    AKI on CKD - Resolving. creatinine 2 on admit, baseline from [facility] is 1.3-1.5; this AM of 1.26   - prerenal AKI- FeNa of 0.3%   - trend with daily BMP   - encourage pO fluids.     CVA   - home clopidogrel 75 mg daily     GERD   - omeprazole at home --> pantoprazole 40 mg BID (could be contributing to cough)     Constipation   - miralax, senna, colace   - Mg citrate PRN  HTN BP meds initially held for hypotension, resolved, now with SBP in the 140s   - home BP meds- Lisinopril to 10mg , amlodipine at 10mg  daily.   - hold home HCTZ 25mg      F pO  E daily  N regular     Dispo- possibly today vs tomorrow.   CODE STATUS: FULL                  Demaris Callander, MD on 09/14/2013 at 6:37 AM  Pager [phone]    Attending note.    Patient seen and examined personally. Reviewed above note by Dr.Degroot . Agree with exam and plan with following comments:    Patient is feeling better today. Reported some shaking of upper extremities when holding bed rails.  Likely due to acute illness vs prednisone related.  Will watch for now.  Lungs have wheezes b/l all zones  Taper and d/c O2 if O2 sat >90%.  Ambulatory O2 sat was also >90% yesterday.  Will need slow prednisone taper over next few days.  Leukocytosis is due to IV solumedrol given day before yesterday.  Cont bid protonix for next few days then back to once daily.  Cont augmentin for total of 10 days treatment.  Cont other treatment.  Likely home over the weekend.

## 2013-09-14 NOTE — Progress Notes (Addendum)
Social Work Progress Note:    Pt requested to meet with SW and inquired about [facility] and [facility] emergency services.  SW provided print out of emergency services for both dental facilities.   Pt stated that he is looking to move from his current address and requested SW to print out [place] [facility], which SW provided from [facility] web-site for pt to review and follow up as interested.  Pt anticipates that he will be discharged over the weekend and stated that  he would need transportation home and assistance with covering cost of medications for co-pay.     SW will assist with d/c needs when pt medically ready for d/c.     Cranford MonJewell Amire Gossen, LCSW  PIC [phone]    UPDATE:    In preparation for weekend d/c, pt prescriptions sent to inpatient pharmacy.  SW completed and submitted pharmacy voucher.    [address]Dontell Mian, LCSW  PIC [phone]

## 2013-09-14 NOTE — Student Note (Signed)
[facility] Medicine Progress Note                                          Significant 24 Hour Events      No acute events overnight    Subjective      Mr. Russello feels that he is doing better this morning and has been producing lots of sputum with his coughing. He is still having some shortness of breath, wheezing, and tight chest pain but feels that they are improved from yesterday. He has been using his PRN 1 mg dilaudid in the evening and morning for chest pain. However, he is concerned about going home today and would prefer to stay until tomorrow because he still feels generalized weakness and short of breath when walking. He agreed to try walking around the unit more today with and without oxygen. He denies any nausea or vomiting and has been tolerating a regular diet. He did have 3 BMs yesterday after taking magnesium citrate.    Objective      Physical Exam  Vitals: BP 139/75  Pulse 70  Temp(Src) 36.4 C (97.5 F) (Temporal)  Resp 22  Ht 1.829 m (6' 0.01")  Wt 136.079 kg (300 lb)  BMI 40.68 kg/m2  SpO2 94%      I/O:   No intake or output data in the 24 hours ending 09/14/13 [id number]    Constitutional: Patient is resting in bed comfortably and in no acute distress.  HEENT: Normocephalic. Atraumatic. PERRL bilaterally. No conjunctival injection. External ears normal. Tympanic membranes appeared inflamed, bulging, and purulent bilaterally. No oropharyngeal lesions or exudates. Moist mucous membranes. Supple neck without any lymphadenopathy. No masses.  Cardiovascular: Distant heart sounds. RRR, normal S1/S2, no murmurs, rubs, or gallops.  Respiratory:  Increased work of breathing with accessory muscle use. Still with diffuse expiratory wheezes bilaterally throughout all lung fields.  Abdomen: Soft, nondistended. Some suprapubic and epigastric tenderness to light and deep palpation. No hepatosplenomegaly. No masses  Extremities:  Warm. No erythema or edema in extremities.  Skin:  No rashes  evident.     Labs:  Recent Results (from the past 24 hour(s))   BASIC METABOLIC PANEL    Collection Time     09/14/13  1:22 AM       Result Value Range    Glucose 148 (*) 60 - 99 mg/dL    Sodium 140  133 - 145 mmol/L    Potassium 4.5  3.3 - 5.1 mmol/L    Chloride 101  96 - 108 mmol/L    CO2 22  20 - 28 mmol/L    Anion Gap 17 (*) 7 - 16    UN 14  6 - 20 mg/dL    Creatinine 1.26 (*) 0.67 - 1.17 mg/dL    GFR,Caucasian 63      GFR,Black 73      Calcium 9.1  8.6 - 10.2 mg/dL   CBC AND DIFFERENTIAL    Collection Time     09/14/13  1:22 AM       Result Value Range    WBC 16.5 (*) 4.2 - 9.1 THOU/uL    RBC 4.8  4.6 - 6.1 MIL/uL    Hemoglobin 14.4  13.7 - 17.5 g/dL    Hematocrit 43  40 - 51 %    MCV 90  79 - 92 fL  MCH 30  26 - 32 pg/cell    MCHC 33  32 - 37 g/dL    RDW 14.6 (*) 11.6 - 14.4 %    Platelets 239  150 - 330 THOU/uL    Seg Neut % 67.2  34.0 - 67.9 %    Lymphocyte % 26.1  21.8 - 53.1 %    Monocyte % 3.4 (*) 5.3 - 12.2 %    Eosinophil % 0.0 (*) 0.8 - 7.0 %    Basophil % 0.0  0.2 - 1.2 %    Neut # K/uL 11.1 (*) 1.8 - 5.4 THOU/uL    Lymph # K/uL 4.9 (*) 1.3 - 3.6 THOU/uL    Mono # K/uL 0.6  0.3 - 0.8 THOU/uL    Eos # K/uL 0.0  0.0 - 0.5 THOU/uL    Baso # K/uL 0.0  0.0 - 0.1 THOU/uL   REACTIVE LYMPHS    Collection Time     09/14/13  1:22 AM       Result Value Range    React Lymph % 3  0 - 6 %   MISC. CELL %    Collection Time     09/14/13  1:22 AM       Result Value Range    Misc. Cell % 0  0 - 0 %   GIANT PLATELETS    Collection Time     09/14/13  1:22 AM       Result Value Range    Giant PLTs Present     DIFF BASED ON    Collection Time     09/14/13  1:22 AM       Result Value Range    Diff Based On 119     MANUAL DIFFERENTIAL    Collection Time     09/14/13  1:22 AM       Result Value Range    Manual DIFF RESULTS           Imaging:  09/09/2013 2:42 PM   CHEST FRONTAL AND LAT     IMPRESSION:   No acute cardiopulmonary disease.    Assessment     This is a case of chest pain, SOB, cough, fever, and chills in a 58 yo  man with a PMH significant for HTN, HLD, COPD, CVA x2, and bradycardia s/p pacemaker placement. Given his history of cocaine abuse, his initial chest pain upon presentation to [facility] may have been caused by a supply-mediated ischemic event due to vasospasm from cocaine. He most likely subsequently contracted RSV bronchiolitis that caused an exacerbation of his existing COPD, leading to his SOB, dry cough, chills, and fever. His current chest pain is most likely due to costochondritis from his coughing fits, but could also be due to angina or an ischemic event. His episodes of syncope were most likely due to dehydration as a result of poor PO intake and multiple episodes of vomiting, leading to orthostatic hypotension. This is consistent with his resulting prerenal AKI (due to a FeNa of <1%), which has improved after multiple fluid boluses. A less likely possibility for the syncopal episodes is another CVA, however he does not appear to have any new onset focal neurologic deficits on exam.     Plan     1. COPD exacerbation due to RSV bronchiolitis   -S/p 60 mg prednisone PO x1   -Day 6 of oral prednisone (60 mg)  -Continue duonebs 4x/day   -Continue symbicort inhaler 2 puffs 2x/day   -  Albuterol inhaler q4h PRN for wheezing and SOB   -Tylenol PRN for fever  -Chest physiotherapy  -Continue guaifenesin to help with secretions  -Dilaudid 1 mg PO q4h PRN for severe chest and back pain    2. Episodes of syncope   -Received 2 L NS fluid boluses in ED  -Likely due to dehydration and orthostatic hypotension  -Discontinued mIVF  -EKG revealed only occasional irregular rhythm but otherwise normal sinus rhythm   -Can discontinue telemetry    3. Acute Kidney Injury   -Resolving, creatinine improved  -Likely prerenal in origin as FeNA was <1% (0.3%)   -Continue to follow BMP and urine electrolytes   -Home antihypertensives held due to initial hypotension of 107/65. Restarted amlodipine and lisinopril    4. History of CVA   -Continue  clopidogrel 75 mg 1x/day     5. GERD   -Continue pantoprazole 40 mg 1x/day     6. HTN   -Continue amlodipine and lisinopril  -Hold HCTZ for now due to AKI    7. Possible CHF   -Ruled out given BNP of 70  -Will need to follow-up with outpatient cardiologist    8. Chest pain   -Likely costochondritis  -MI or ischemic event seems unlikely given troponin T of <0.01  -Dilaudid 1 mg PO q4h PRN    9. Bilateral otitis media  -Completed 2 days of unasyn. Day 2 of augmentin  -Phenol spray for sore throat  -Zyrtec for watery, itchy eyes    10. Constipation  -Continue senna 1 tablet/day, colace 100 mg 2x/day  -Continue miralax    11. Tooth pain  -Will need to follow-up with outpatient dentistry        F:  none  E:  Daily BMP  N:  Encourage PO  DVT Prophylaxis:   lovenox    Code Status:  FULL CODE    Disposition: pending further stabilization over next 24-48 hrs    Angela Burke, Revonda Humphrey, Pager [phone]  This is a Careers information officer note. Please see attending note for final assessment and plan.

## 2013-09-14 NOTE — Discharge Instructions (Signed)
Brief Summary of Your Hospital Course (including key procedures and diagnostic test results):  [facility] exacerbation due to RSV bronchiolitis. COPD is a lung disease that makes it difficult to breathe. In people with COPD, the airways become narrow and damaged. This can make people feel short of breath, wheeze, and cough up phlegm. We believe that you developed an acute worsening of your COPD due to a viral illness caused by respiratory syncytial virus (RSV). You had a chest xray that did not reveal any evidence of pneumonia or infection. We treated you with steroids, nebulizers, and inhalers. You were also given some pain medicine to help control your chest and back pain. Your chest pain was most likely due to coughing so much throughout the COPD exacerbation, and should improve with rest. You were also found to have an ear infection in both ears and were treated with antibiotics.     Your instructions:  -Continue taking your oral antibiotics as prescribed   -Continue taking your home blood pressure and acid reflux medications  -Continue taking your symbicort inhaler 2 times each day and your albuterol inhaler as needed  - Continue your steroid taper as prescribed  -Follow up with your PCP, Acquanetta ChainKaren Mazza [phone]), at 9:30 am on 09/19/13  -Call your cardiologist, Dr. Caren HazyMcGrody [phone]), to schedule a follow-up appointment within the next few months    Recommended diet: regular diet    Recommended activity: activity as tolerated    Wound Care: none needed    If you experience any of these symptoms within the first 24 hours after discharge:Uncontrolled pain, Chest pain, Shortness of breath, Fever greater than 100.5, Vomiting, Diarrhea or Loss of consciousness please follow up with the discharge attending Dr. Karie MainlandAli at phone-number: [phone]    If you experience any of these symptoms 24 hours or more after discharge:Uncontrolled pain, Chest pain, Shortness of breath, Fever greater than  100.5 or Loss of consciousness please follow up with your PCP: Acquanetta ChainKaren Mazza [phone])

## 2013-09-14 NOTE — Progress Notes (Signed)
[facility] SOCIAL WORK  PHARMACY FORM  Todays date:  September 14, 2013    Patient Name: Nathan Ochoa   Medical Record #:  [id number]       Patients Address:  [address], [address]                     DOB:  September 16, 1955      Social Worker: Cranford MonJewell Andri Prestia      Date of Service: September 14, 2013         Funding Source   []  Medicaid Pending       [x]  SW Medication Assistance Fund   []  [employer]     [] Other source                    ___________________________________________________________________  Pharmacy Information:     Date/time sent:September 14, 2013 Time needed:  For d/c         []  ED []  After hours []  Outpatient [x]  Inpatient (Specify unit) [phone]     []  Please call ext.     when ready  []  Patient will pick up at pharmacy   []  Please tube to station #      when ready      Pharmacy Contact:   Tiffany      Social work signature: Associate ProfessorJewell Ormond Lazo  Supervisor/Manager Approval (if indicated) :         Date:  (supervisor signature not required for [facility])    Pharmacy Fax # [phone]  Tube [phone]  Print form and fax or tube to pharmacy.  If it requires Supervisor approval copy and email to supervisor  Cranford MonJewell Jeremy Ditullio, LCSW  PIC [phone]

## 2013-09-15 LAB — CBC AND DIFFERENTIAL
Baso # K/uL: 0.3 10*3/uL — ABNORMAL HIGH (ref 0.0–0.1)
Basophil %: 2 % — ABNORMAL HIGH (ref 0.2–1.2)
Eos # K/uL: 0 10*3/uL (ref 0.0–0.5)
Eosinophil %: 0 % — ABNORMAL LOW (ref 0.8–7.0)
Hematocrit: 46 % (ref 40–51)
Hemoglobin: 15 g/dL (ref 13.7–17.5)
Lymph # K/uL: 4.7 10*3/uL — ABNORMAL HIGH (ref 1.3–3.6)
Lymphocyte %: 25 % (ref 21.8–53.1)
MCH: 30 pg/cell (ref 26–32)
MCHC: 33 g/dL (ref 32–37)
MCV: 93 fL — ABNORMAL HIGH (ref 79–92)
Mono # K/uL: 0.7 10*3/uL (ref 0.3–0.8)
Monocyte %: 4 % — ABNORMAL LOW (ref 5.3–12.2)
Neut # K/uL: 10.6 10*3/uL — ABNORMAL HIGH (ref 1.8–5.4)
Platelets: 246 10*3/uL (ref 150–330)
RBC: 5 MIL/uL (ref 4.6–6.1)
RDW: 15.4 % — ABNORMAL HIGH (ref 11.6–14.4)
Seg Neut %: 65 % (ref 34.0–67.9)
WBC: 16.4 10*3/uL — ABNORMAL HIGH (ref 4.2–9.1)

## 2013-09-15 LAB — BASIC METABOLIC PANEL
Anion Gap: 16 (ref 7–16)
CO2: 26 mmol/L (ref 20–28)
Calcium: 8.6 mg/dL (ref 8.6–10.2)
Chloride: 101 mmol/L (ref 96–108)
Creatinine: 1.41 mg/dL — ABNORMAL HIGH (ref 0.67–1.17)
GFR,Black: 63 *
GFR,Caucasian: 55 * — AB
Glucose: 80 mg/dL (ref 60–99)
Lab: 17 mg/dL (ref 6–20)
Potassium: 4.5 mmol/L (ref 3.3–5.1)
Sodium: 143 mmol/L (ref 133–145)

## 2013-09-15 LAB — DIFF BASED ON: Diff Based On: 100 CELLS

## 2013-09-15 LAB — MANUAL DIFFERENTIAL

## 2013-09-15 LAB — REACTIVE LYMPHS: React Lymph %: 4 % (ref 0–6)

## 2013-09-15 MED ORDER — HYDROMORPHONE HCL 2 MG PO TABS *I*
1.0000 mg | ORAL_TABLET | Freq: Three times a day (TID) | ORAL | Status: AC | PRN
Start: 2013-09-15 — End: 2013-09-17

## 2013-09-15 MED ORDER — HYDROMORPHONE HCL 2 MG PO TABS *I*
1.0000 mg | ORAL_TABLET | Freq: Three times a day (TID) | ORAL | Status: DC | PRN
Start: 2013-09-15 — End: 2013-09-15
  Administered 2013-09-15: 1 mg via ORAL

## 2013-09-15 NOTE — Progress Notes (Signed)
Pt ready for discharge today.  [place]rs [place] [employer] [place] at 11:45a.  Nsg aware.  SW also updated out pat pharmacy voucher for new scripts added today.  Thank you.  JMBestram,BSW  [phone]

## 2013-09-15 NOTE — Progress Notes (Addendum)
Hospital Medicine Progress Note                                                Significant 24 Hour Events      - no acute events overnight    Subjective      - Still with some wheezing, but continues to feel much better every day. Some residual chest pain with cough, no change in nature. Eating well. Looking forward to going home this morning (he can not get into his apartment after 11:30). Planning on following up with his PCP next week. Notes a lot of drainage from throat and nose since starting abx.     Objective      Physical Exam  Recent vital signs reviewed and notable for:  BP: (129-159)/(74-85)   Temp:  [36.7 C (98.1 F)]   Temp src:  [-]   Heart Rate:  [56-70]   Resp:  [20-22]   SpO2:  [92 %-96 %]       General Constitutional: Obese AA male sitting in bed, breathing more comfortably than yesterday, speech much less congested.  HEENT: PERRL, MMM, nonerythematous oropharynx. Cardiovascular: Difficult to hear secondary to wheezing no m/r/g; 2+ radial pulses.   Pulmonary: Diffuse exp wheezes bilaterally in all lung fields, though much improved compared to yesterday.   Gastrointestinal: NBS, soft, ND, slightly tender   Neurologic: AAOx3  Skin: No rashes      Recent Lab, Micro, and Imaging Studies   Personally reviewed and notable for:    Recent Results (from the past 24 hour(s))   BASIC METABOLIC PANEL    Collection Time     09/15/13  2:34 AM       Result Value Range    Glucose 80  60 - 99 mg/dL    Sodium 161143  096133 - 045145 mmol/L    Potassium 4.5  3.3 - 5.1 mmol/L    Chloride 101  96 - 108 mmol/L    CO2 26  20 - 28 mmol/L    Anion Gap 16  7 - 16    UN 17  6 - 20 mg/dL    Creatinine 4.091.41 (*) 0.67 - 1.17 mg/dL    GFR,Caucasian 55 (*)     GFR,Black 63      Calcium 8.6  8.6 - 10.2 mg/dL   CBC AND DIFFERENTIAL    Collection Time     09/15/13  2:34 AM       Result Value Range    WBC 16.4 (*) 4.2 - 9.1 THOU/uL    RBC 5.0  4.6 - 6.1 MIL/uL    Hemoglobin 15.0  13.7 - 17.5 g/dL    Hematocrit 46  40 - 51 %    MCV 93 (*)  79 - 92 fL    MCH 30  26 - 32 pg/cell    MCHC 33  32 - 37 g/dL    RDW 81.115.4 (*) 91.411.6 - 14.4 %    Platelets 246  150 - 330 THOU/uL    Seg Neut % 65.0  34.0 - 67.9 %    Lymphocyte % 25.0  21.8 - 53.1 %    Monocyte % 4.0 (*) 5.3 - 12.2 %    Eosinophil % 0.0 (*) 0.8 - 7.0 %    Basophil % 2.0 (*) 0.2 - 1.2 %  Neut # K/uL 10.6 (*) 1.8 - 5.4 THOU/uL    Lymph # K/uL 4.7 (*) 1.3 - 3.6 THOU/uL    Mono # K/uL 0.7  0.3 - 0.8 THOU/uL    Eos # K/uL 0.0  0.0 - 0.5 THOU/uL    Baso # K/uL 0.3 (*) 0.0 - 0.1 THOU/uL   REACTIVE LYMPHS    Collection Time     09/15/13  2:34 AM       Result Value Range    React Lymph % 4  0 - 6 %   DIFF BASED ON    Collection Time     09/15/13  2:34 AM       Result Value Range    Diff Based On 100     MANUAL DIFFERENTIAL    Collection Time     09/15/13  2:34 AM       Result Value Range    Manual DIFF RESULTS           No results found.    Assessment     This is a case of fevers/chills/SOB + CP in a 58 yo M history of COPD, CVA, h/o MI, chronic back pain, prior tobacco use; found to be RSV positive, treating for COPD exacerbation- not improving on oral steroids, also with superimposed otitis media- treating with Unasyn.     Plan     URI symptoms, otitis media.   - infected appearance of ears on physical exam   - zyrtec for watery, itchy eyes   - mucinex   - Unasyn switched to Augmentin yesterday, day 5 of 7 for antibiotics.     RSV, hx of COPD   - pt currently on pO prednisone 60mg , day 7 of steroids, will d/c on slow taper (by 10 mg q 3 days).  - duonebs qid   - albuterol prn   - symbicort BID--script written for home refill.  - Amb O2 on RA yesterday was 94-96%.    Syncope prior to admission- sounds orthostatic in nature likely related to dehydration   - EKG without significant changes   - pO fluids     Chest pain- patient endorses remote history of MI resulting with sudden cardiac arrest requiring CPR, isnt sure if he has CAD. Patient endorses cocaine induced CP at [facility] prior.   - troponin negative   -  pt has cardiologist outpatient appointment setup.     AKI on CKD - Resolving. creatinine 2 on admit, baseline from [facility] is 1.3-1.5; this AM of 1.4  - prerenal AKI- FeNa of 0.3%, now s/p IVF. Reports good UOP  - trend with daily BMP   - encourage pO fluids.     CVA   - home clopidogrel 75 mg daily     GERD   - omeprazole at home --> pantoprazole 40 mg BID (could be contributing to cough). Continue BID at home.    Constipation   - miralax, senna, colace   - Mg citrate PRN     HTN   - home BP meds- Lisinopril to 10mg , amlodipine at 10mg  daily.   - Continue to hold home HCTZ 25mg , restart deferred to PCP follow up next week.    F pO  E daily  N regular     Dispo- possibly today vs tomorrow.   CODE STATUS: FULL                  Ellamae Sia, MD on 09/15/2013 at 9:01 AM  Pager [phone]    HMD Attending Addendum  I saw and evaluated the patient. I agree with the resident's findings and plan of care as documented above, which reflects my input.    Patient feels better today, would like to go home.  Breathing better, no CP, no SOB at this time.    Exam: as per Dr. Forde Dandy above.  On my lung exam, no wheezing on right side, only very slight/soft end-exp wheeze in LUL and LLL field. RRR.  No rhinorrhea or eye drainage on my exam    # COPD exacerbation:  C.w steroid taper as above, symbicort, albuterol.  # URI: On [place] and clinically stable, continue to complete total 7 days abx  # Leukocytosis: thought to be due to recent iv solumdrol dosing (and he is on oral steroids).  Platued today, expect to decrease as steroids taper down.  No si/sx of new infection today  # Rest of plan above.  D/C today.  Follow up with PCP    Case discussed with Resident Dr. Lonia Blood Little MD  09/15/2013

## 2013-09-15 NOTE — Progress Notes (Signed)
Pt being discharged back to home. Face sheet and AVS went over with patient. IV taken out and pt belongings with pt. Prescriptions obtained in outpatient pharmacy. Social work contacted and a Doctor, [facility]ab is arranged for transportation back home.

## 2013-09-16 NOTE — Discharge Summary (Signed)
Name: Nathan Ochoa MRN: [id number] DOB: 09/08/1955     Admit Date: 09/09/2013   Date of Discharge: 09/15/2013    Discharge Attending Physician: Gwenevere GhaziLITTLE, EMILY      Hospitalization Summary    CONCISE NARRATIVE: [name] is a  58 yo male history of COPD, CVA, MI, chronic back pain, prior tobacco use presenting with fever, chills and shortness of breath found to be RSV positive. He was initially treated for COPD exacerbation- though this took several days to improve on oral steroids. Also found to have superimposed otitis media which was treated with Unasyn. He was weaned off of supplemental oxygen with ambulation. When stable for discharge he was sent out on 7 total day course finished with Augmentin, as well as low taper from 60 mg prednisone (decrement by 10 mg q 3 days). He was continued on his home medications, including spiriva, which was refilled.     He initially had some AKI to Cr of 2, though this improved to his baseline (1.4 on discharge). His HCTZ was held on discharge, and will need to be reassessed at followup visit.          Signed: Ellamae Siaaniel Porfirio Bollier, MD  On: 09/16/2013  at: 4:43 PM

## 2014-01-07 ENCOUNTER — Encounter: Payer: Self-pay | Admitting: *Deleted

## 2014-01-09 ENCOUNTER — Emergency Department (HOSPITAL_COMMUNITY)
Admission: EM | Admit: 2014-01-09 | Discharge: 2014-01-09 | Disposition: A | Discharge status: Home / Self Care | Payer: Medicare Other | Attending: Emergency Medicine | Admitting: Emergency Medicine

## 2014-01-09 ENCOUNTER — Emergency Department (HOSPITAL_COMMUNITY): Payer: Medicare Other

## 2014-01-09 DIAGNOSIS — S90569A Insect bite (nonvenomous), unspecified ankle, initial encounter: Secondary | ICD-10-CM | POA: Insufficient documentation

## 2014-01-09 DIAGNOSIS — R42 Dizziness and giddiness: Secondary | ICD-10-CM | POA: Insufficient documentation

## 2014-01-09 DIAGNOSIS — Y929 Unspecified place or not applicable: Secondary | ICD-10-CM | POA: Insufficient documentation

## 2014-01-09 DIAGNOSIS — T148 Other injury of unspecified body region: Secondary | ICD-10-CM | POA: Insufficient documentation

## 2014-01-09 DIAGNOSIS — Y939 Activity, unspecified: Secondary | ICD-10-CM | POA: Insufficient documentation

## 2014-01-09 DIAGNOSIS — Z8673 Personal history of transient ischemic attack (TIA), and cerebral infarction without residual deficits: Secondary | ICD-10-CM | POA: Insufficient documentation

## 2014-01-09 DIAGNOSIS — E785 Hyperlipidemia, unspecified: Secondary | ICD-10-CM | POA: Insufficient documentation

## 2014-01-09 DIAGNOSIS — W57XXXA Bitten or stung by nonvenomous insect and other nonvenomous arthropods, initial encounter: Secondary | ICD-10-CM

## 2014-01-09 DIAGNOSIS — Z79899 Other long term (current) drug therapy: Secondary | ICD-10-CM | POA: Insufficient documentation

## 2014-01-09 DIAGNOSIS — W5581XA Bitten by other mammals, initial encounter: Secondary | ICD-10-CM

## 2014-01-09 DIAGNOSIS — J441 Chronic obstructive pulmonary disease with (acute) exacerbation: Secondary | ICD-10-CM

## 2014-01-09 DIAGNOSIS — I10 Essential (primary) hypertension: Secondary | ICD-10-CM | POA: Insufficient documentation

## 2014-01-09 DIAGNOSIS — Z950 Presence of cardiac pacemaker: Secondary | ICD-10-CM | POA: Insufficient documentation

## 2014-01-09 DIAGNOSIS — F17200 Nicotine dependence, unspecified, uncomplicated: Secondary | ICD-10-CM | POA: Insufficient documentation

## 2014-01-09 DIAGNOSIS — G894 Chronic pain syndrome: Secondary | ICD-10-CM | POA: Insufficient documentation

## 2014-01-09 DIAGNOSIS — S30860A Insect bite (nonvenomous) of lower back and pelvis, initial encounter: Secondary | ICD-10-CM | POA: Insufficient documentation

## 2014-01-09 DIAGNOSIS — K219 Gastro-esophageal reflux disease without esophagitis: Secondary | ICD-10-CM | POA: Insufficient documentation

## 2014-01-09 LAB — CBC WITH DIFFERENTIAL/PLATELET
BASOS PCT: 0 % (ref 0–1)
Basophils Absolute: 0 10*3/uL (ref 0.0–0.1)
EOS ABS: 0.2 10*3/uL (ref 0.0–0.7)
Eosinophils Relative: 4 % (ref 0–5)
HEMATOCRIT: 41.7 % (ref 39.0–52.0)
Hemoglobin: 14.1 g/dL (ref 13.0–17.0)
Lymphocytes Relative: 15 % (ref 12–46)
Lymphs Abs: 0.8 10*3/uL (ref 0.7–4.0)
MCH: 29.9 pg (ref 26.0–34.0)
MCHC: 33.8 g/dL (ref 30.0–36.0)
MCV: 88.3 fL (ref 78.0–100.0)
MONO ABS: 0.6 10*3/uL (ref 0.1–1.0)
Monocytes Relative: 12 % (ref 3–12)
NEUTROS ABS: 3.7 10*3/uL (ref 1.7–7.7)
Neutrophils Relative %: 69 % (ref 43–77)
Platelets: 165 10*3/uL (ref 150–400)
RBC: 4.72 MIL/uL (ref 4.22–5.81)
RDW: 14.3 % (ref 11.5–15.5)
WBC: 5.3 10*3/uL (ref 4.0–10.5)

## 2014-01-09 LAB — BASIC METABOLIC PANEL
BUN: 9 mg/dL (ref 6–23)
CHLORIDE: 100 meq/L (ref 96–112)
CO2: 27 mEq/L (ref 19–32)
Calcium: 9.2 mg/dL (ref 8.4–10.5)
Creatinine, Ser: 1.28 mg/dL (ref 0.50–1.35)
GFR, EST AFRICAN AMERICAN: 70 mL/min — AB (ref >90)
GFR, EST NON AFRICAN AMERICAN: 61 mL/min — AB (ref >90)
Glucose, Bld: 103 mg/dL — ABNORMAL HIGH (ref 70–99)
Potassium: 4 mEq/L (ref 3.7–5.3)
Sodium: 139 mEq/L (ref 137–147)

## 2014-01-09 LAB — PRO B NATRIURETIC PEPTIDE: PRO B NATRI PEPTIDE: 91.7 pg/mL (ref 0–125)

## 2014-01-09 LAB — TROPONIN I: Troponin I: <0.3 ng/mL (ref <0.30)

## 2014-01-09 MED ORDER — PREDNISONE 20 MG PO TABS
60.0000 mg | ORAL_TABLET | Freq: Once | ORAL | Status: AC
  Administered 2014-01-09: 60 mg via ORAL
  Filled 2014-01-09: qty 3

## 2014-01-09 MED ORDER — DOXYCYCLINE HYCLATE 100 MG PO CAPS: 100.0000 mg | ORAL_CAPSULE | Freq: Two times a day (BID) | ORAL | Status: DC

## 2014-01-09 MED ORDER — ALBUTEROL SULFATE (2.5 MG/3ML) 0.083% IN NEBU
2.5000 mg | INHALATION_SOLUTION | RESPIRATORY_TRACT | Status: DC | PRN
  Administered 2014-01-09 (×2): 2.5 mg via RESPIRATORY_TRACT
  Filled 2014-01-09 (×2): qty 3

## 2014-01-09 MED ORDER — PREDNISONE 10 MG PO TABS: 20.0000 mg | ORAL_TABLET | Freq: Every day | ORAL | Status: DC

## 2014-01-09 MED ORDER — HYDROCODONE-ACETAMINOPHEN 5-325 MG PO TABS
1.0000 | ORAL_TABLET | Freq: Once | ORAL | Status: AC
Start: 2014-01-09 — End: 2014-01-09
  Administered 2014-01-09: 1 via ORAL
  Filled 2014-01-09: qty 1

## 2014-01-09 MED ORDER — BENZONATATE 100 MG PO CAPS: 100.0000 mg | ORAL_CAPSULE | Freq: Three times a day (TID) | ORAL | Status: DC

## 2014-01-09 NOTE — ED Provider Notes (Signed)
CSN: [id number]     Arrival date & time 01/09/14  [id number] History   First MD Initiated Contact with Patient 01/09/14 [phone]     Chief Complaint  Patient presents with  . Shortness of Breath      HPI  Patient presents with complaint of "I'm sick". He states that he's had a cold for 4-5 days. He has a headache today. Sinus drainage. So short of breath and has a cough. This is a little bit of edema in his legs is been present for several weeks. States that he was taken off hydrochlorothiazide because the blood pressures were better. He describes dizziness somewhat like he is off balance was somewhat like he may "fall out".  History of a pacemaker her for sinus syndrome also hyperlipidemia, hypertension, chronic back pain, COPD.  Has been compliant with his meds at home.  Vision on Saturday, 5 days ago. On Sunday he found a tick on his back, left thigh, left lower leg. Removed them. No rash. History of eczema.   Past Medical History  Diagnosis Date  . Hypertension     amlodipine, lisinopril  . Stroke     was on aspirin before stroke. July 2012 in [place].  sent home aspirin.   . Hyperlipidemia     zocor 40mg  . Tobacco abuse     14-15 pack years at 2 packs per week  . Sick sinus syndrome     s/p pacemaker, no defibrillator  . GERD (gastroesophageal reflux disease)   . Chronic pain syndrome     on methadone and percocet   Past Surgical History  Procedure Laterality Date  . Pacemaker insertion    . Back surgery      5 back surgeries  . Hand surgery      3 broken bones  . Bunionectomy      right foot   Family History  Problem Relation Age of Onset  . Stroke      grandfather and uncle died of stroke  . Heart attack      uncle  . Cancer Father     throat  . Transient ischemic attack Mother     87  and doing well   History  Substance Use Topics  . Smoking status: Current Every Day Smoker  . Smokeless tobacco: Not on file  . Alcohol Use: No    Review of Systems  Constitutional:  Positive for fever, chills and fatigue. Negative for diaphoresis and appetite change.  HENT: Positive for sinus pressure. Negative for mouth sores, sore throat and trouble swallowing.   Eyes: Negative for visual disturbance.  Respiratory: Positive for cough and shortness of breath. Negative for chest tightness and wheezing.   Cardiovascular: Positive for leg swelling. Negative for chest pain.  Gastrointestinal: Negative for nausea, vomiting, abdominal pain, diarrhea and abdominal distention.  Endocrine: Negative for polydipsia, polyphagia and polyuria.  Genitourinary: Negative for dysuria, frequency and hematuria.  Musculoskeletal: Negative for gait problem.  Skin: Negative for color change, pallor and rash.  Neurological: Positive for dizziness and headaches. Negative for syncope and light-headedness.  Hematological: Does not bruise/bleed easily.  Psychiatric/Behavioral: Negative for behavioral problems and confusion.      Allergies  Dilaudid  Home Medications   Prior to Admission medications   Medication Sig Start Date End Date Taking? Authorizing Provider  albuterol (PROVENTIL HFA;VENTOLIN HFA) 108 (90 BASE) MCG/ACT inhaler Inhale 2 puffs into the lungs 2 (two) times daily.   Yes Historical Provider, MD  amLODipine (NORVASC)  10 MG tablet Take 10 mg by mouth daily.   Yes Historical Provider, MD  Aspirin-Salicylamide-Caffeine (BC HEADACHE POWDER PO) Take 1 Package by mouth every 6 (six) hours as needed (for pain).   Yes Historical Provider, MD  atorvastatin (LIPITOR) 20 MG tablet Take 20 mg by mouth daily.   Yes Historical Provider, MD  budesonide-formoterol (SYMBICORT) 160-4.5 MCG/ACT inhaler Inhale 2 puffs into the lungs daily.   Yes Historical Provider, MD  clopidogrel (PLAVIX) 75 MG tablet Take 1 tablet (75 mg total) by mouth daily with breakfast. 06/02/12  Yes Bryan R Hess, DO  lisinopril (PRINIVIL,ZESTRIL) 10 MG tablet Take 10 mg by mouth daily.   Yes Historical Provider, MD   omeprazole (PRILOSEC) 20 MG capsule Take 1 capsule (20 mg total) by mouth daily. 06/02/12  Yes Bryan R Hess, DO  oxyCODONE-acetaminophen (PERCOCET) 10-325 MG per tablet Take 1 tablet by mouth every 4 (four) hours as needed for pain.   Yes Historical Provider, MD  lisinopril-hydrochlorothiazide (PRINZIDE,ZESTORETIC) 10-12.5 MG per tablet Take 1 tablet by mouth daily. 06/02/12 06/02/13  Bryan R Hess, DO   BP 132/78  Pulse 94  Temp(Src) 99.3 F (37.4 C) (Oral)  Resp 18  SpO2 92% Physical Exam  Constitutional: He is oriented to person, place, and time. He appears well-developed and well-nourished. No distress.  HENT:  Head: Normocephalic.  Nose: Mucosal edema and rhinorrhea present. No sinus tenderness.  Eyes: Conjunctivae are normal. Pupils are equal, round, and reactive to light. No scleral icterus.  Neck: Normal range of motion. Neck supple. No thyromegaly present.  Cardiovascular: Normal rate, regular rhythm and normal heart sounds.  Exam reveals no gallop and no friction rub.   No murmur heard. Rhythm.  Pulmonary/Chest: Effort normal and breath sounds normal. No respiratory distress. He has no wheezes. He has no rales.  Wheezing and prolongation in all fields. No focal diminished breath sounds.  Abdominal: Soft. Bowel sounds are normal. He exhibits no distension. There is no tenderness. There is no rebound.  Musculoskeletal: Normal range of motion.  Neurological: He is alert and oriented to person, place, and time.  Skin: Skin is warm and dry. No rash noted.  1+ symmetric bilateral extremity edema.  Psychiatric: He has a normal mood and affect. His behavior is normal.    ED Course  Procedures (including critical care time) Labs Review Labs Reviewed  BASIC METABOLIC PANEL - Abnormal; Notable for the following:    Glucose, Bld 103 (*)    GFR calc non Af Amer 61 (*)    GFR calc Af Amer 70 (*)    All other components within normal limits  CBC WITH DIFFERENTIAL  PRO B NATRIURETIC  PEPTIDE  TROPONIN I    Imaging Review Dg Chest 2 View  01/09/2014   CLINICAL DATA:  Chest pain and shortness of breath.  EXAM: CHEST  2 VIEW  COMPARISON:  05/31/2012  FINDINGS: Two views of the chest demonstrate a left dual lead cardiac pacemaker. Slightly low lung volumes are similar to the previous examination. No focal airspace disease or pulmonary edema. Heart size is stable and within normal limits. No evidence for pleural effusions.  IMPRESSION: No acute cardiopulmonary disease.   Electronically Signed   By: Richarda Overlie M.D.   On: 01/09/2014 11:16     EKG Interpretation   Date/Time:  Wednesday Jan 09 2014 09:27:13 EDT Ventricular Rate:  99 PR Interval:  160 QRS Duration: 92 QT Interval:  326 QTC Calculation: 418 R Axis:  89 Text Interpretation:  Normal sinus rhythm Cannot rule out Inferior infarct  , age undetermined Abnormal ECG Confirmed by Fayrene FearingJAMES  MD, Mylin Hirano ([id number]) on  01/09/2014 10:47:58 AM      MDM   Final diagnoses:  COPD exacerbation  Tick bite    No abnormalities on CXR.  Not hypoxemic.  Improved after HHN Albuterol.  No signs of tick born infection at this time.  However, will use Doxy for COPD exacerbation.    Rolland PorterMark Mirza Fessel, MD 01/10/14 [phone]

## 2014-01-09 NOTE — ED Notes (Signed)
EKG completed in triage and signed by EDP.

## 2014-01-09 NOTE — Discharge Instructions (Signed)
Chronic Obstructive Pulmonary Disease Exacerbation Chronic obstructive pulmonary disease (COPD) is a common lung condition in which airflow from the lungs is limited. COPD is a general term that can be used to describe many different lung problems that limit airflow, including chronic bronchitis and emphysema. COPD exacerbations are episodes when breathing symptoms become much worse and require extra treatment. Without treatment, COPD exacerbations can be life threatening, and frequent COPD exacerbations can cause further damage to your lungs. CAUSES   Respiratory infections.   Exposure to smoke.   Exposure to air pollution, chemical fumes, or dust. Sometimes there is no apparent cause or trigger. RISK FACTORS  Smoking cigarettes.  Older age.  Frequent prior COPD exacerbations. SIGNS AND SYMPTOMS   Increased coughing.   Increased thick spit (sputum) production.   Increased wheezing.   Increased shortness of breath.   Rapid breathing.   Chest tightness. DIAGNOSIS  Your medical history, a physical exam, and tests will help your health care provider make a diagnosis. Tests may include:  A chest X-ray.  Basic lab tests.  Sputum testing.  An arterial blood gas test. TREATMENT  Depending on the severity of your COPD exacerbation, you may need to be admitted to a hospital for treatment. Some of the treatments commonly used to treat COPD exacerbations are:   Antibiotic medicines.   Bronchodilators. These are drugs that expand the air passages. They may be given with an inhaler or nebulizer. Spacer devices may be needed to help improve drug delivery.  Corticosteroid medicines.  Supplemental oxygen therapy.  HOME CARE INSTRUCTIONS   Do not smoke. Quitting smoking is very important to prevent COPD from getting worse and exacerbations from happening as often.  Avoid exposure to all substances that irritate the airway, especially to tobacco smoke.   If prescribed,  take your antibiotics as directed. Finish them even if you start to feel better.  Only take over-the-counter or prescription medicines as directed by your health care provider.It is important to use correct technique with inhaled medicines.  Drink enough fluids to keep your urine clear or pale yellow (unless you have a medical condition that requires fluid restriction).  Use a cool mist vaporizer. This makes it easier to clear your chest when you cough.   If you have a home nebulizer and oxygen, continue to use them as directed.   Maintain all necessary vaccinations to prevent infections.   Exercise regularly.   Eat a healthy diet.   Keep all follow-up appointments as directed by your health care provider. SEEK IMMEDIATE MEDICAL CARE IF:  You have worsening shortness of breath.   You have trouble talking.   You have severe chest pain.  You have blood in your sputum.  You have a fever.  You have weakness, vomit repeatedly, or faint.   You feel confused.   You continue to get worse. MAKE SURE YOU:   Understand these instructions.  Will watch your condition.  Will get help right away if you are not doing well or get worse. Document Released: 06/13/2007 Document Revised: 06/06/2013 Document Reviewed: 04/20/2013 [facility] Patient Information 2014 [place], [place].  Tick Bite Information Ticks are insects that attach themselves to the skin and draw blood for food. There are various types of ticks. Common types include wood ticks and deer ticks. Most ticks live in shrubs and grassy areas. Ticks can climb onto your body when you make contact with leaves or grass where the tick is waiting. The most common places on the body for  ticks to attach themselves are the scalp, neck, armpits, waist, and groin. Most tick bites are harmless, but sometimes ticks carry germs that cause diseases. These germs can be spread to a person during the tick's feeding process. The chance of a  disease spreading through a tick bite depends on:   The type of tick.  Time of year.   How long the tick is attached.   Geographic location.  HOW CAN YOU PREVENT TICK BITES? Take these steps to help prevent tick bites when you are outdoors:  Wear protective clothing. Long sleeves and long pants are best.   Wear white clothes so you can see ticks more easily.  Tuck your pant legs into your socks.   If walking on a trail, stay in the middle of the trail to avoid brushing against bushes.  Avoid walking through areas with long grass.  Put insect repellent on all exposed skin and along boot tops, pant legs, and sleeve cuffs.   Check clothing, hair, and skin repeatedly and before going inside.   Brush off any ticks that are not attached.  Take a shower or bath as soon as possible after being outdoors.  WHAT IS THE PROPER WAY TO REMOVE A TICK? Ticks should be removed as soon as possible to help prevent diseases caused by tick bites. 1. If latex gloves are available, put them on before trying to remove a tick.  2. Using fine-point tweezers, grasp the tick as close to the skin as possible. You may also use curved forceps or a tick removal tool. Grasp the tick as close to its head as possible. Avoid grasping the tick on its body. 3. Pull gently with steady upward pressure until the tick lets go. Do not twist the tick or jerk it suddenly. This may break off the tick's head or mouth parts. 4. Do not squeeze or crush the tick's body. This could force disease-carrying fluids from the tick into your body.  5. After the tick is removed, wash the bite area and your hands with soap and water or other disinfectant such as alcohol. 6. Apply a small amount of antiseptic cream or ointment to the bite site.  7. Wash and disinfect any instruments that were used.  Do not try to remove a tick by applying a hot match, petroleum jelly, or fingernail polish to the tick. These methods do not  work and may increase the chances of disease being spread from the tick bite.  WHEN SHOULD YOU SEEK MEDICAL CARE? Contact your health care provider if you are unable to remove a tick from your skin or if a part of the tick breaks off and is stuck in the skin.  After a tick bite, you need to be aware of signs and symptoms that could be related to diseases spread by ticks. Contact your health care provider if you develop any of the following in the days or weeks after the tick bite:  Unexplained fever.  Rash. A circular rash that appears days or weeks after the tick bite may indicate the possibility of Lyme disease. The rash may resemble a target with a bull's-eye and may occur at a different part of your body than the tick bite.  Redness and swelling in the area of the tick bite.   Tender, swollen lymph glands.   Diarrhea.   Weight loss.   Cough.   Fatigue.   Muscle, joint, or bone pain.   Abdominal pain.   Headache.  Lethargy or a change in your level of consciousness.  Difficulty walking or moving your legs.   Numbness in the legs.   Paralysis.  Shortness of breath.   Confusion.   Repeated vomiting.  Document Released: 08/13/2000 Document Revised: 06/06/2013 Document Reviewed: 01/24/2013 [facility]Exit[facility] Patient Information 2014 [place]ExitCare, [place].

## 2014-01-09 NOTE — ED Notes (Signed)
Pt returned from radiology.

## 2014-01-09 NOTE — ED Notes (Signed)
Pt O2 at 90-92%, when asked to deep breathe sat increases to 99%. Pt requests breathing treatment.

## 2014-01-09 NOTE — ED Notes (Signed)
Pt reports he has SOB starting yesterday, last night and getting chills. Has COPD and has been taking meds. 94% on RA. Pt is not working to breathe. Reports that he went fishing this weekend and thinks he got bitten by ticks and that may have something to do with it. Now states he feels dizzy and like he may fall.

## 2014-01-15 ENCOUNTER — Encounter: Payer: Self-pay | Admitting: Internal Medicine

## 2014-01-15 ENCOUNTER — Ambulatory Visit (INDEPENDENT_AMBULATORY_CARE_PROVIDER_SITE_OTHER): Payer: Medicare Other | Admitting: Internal Medicine

## 2014-01-15 VITALS — BP 108/75 | HR 72 | Ht 73.0 in | Wt 309.0 lb

## 2014-01-15 DIAGNOSIS — Z950 Presence of cardiac pacemaker: Secondary | ICD-10-CM

## 2014-01-15 DIAGNOSIS — I495 Sick sinus syndrome: Secondary | ICD-10-CM

## 2014-01-15 MED ORDER — APIXABAN 5 MG PO TABS: 5.0000 mg | ORAL_TABLET | Freq: Two times a day (BID) | ORAL | Status: DC

## 2014-01-15 MED ORDER — AMLODIPINE BESYLATE 5 MG PO TABS: 5.0000 mg | ORAL_TABLET | Freq: Every day | ORAL | Status: DC

## 2014-01-15 NOTE — Patient Instructions (Signed)
Your physician has recommended you make the following change in your medication:  1) DECREASE Amlodipine to 5 mg daily 2) STOP Plavix 3) START Eliquis 5 mg twice daily  Your physician wants you to follow-up in: 6 months with Dr. Graciela HusbandsKlein. You will receive a reminder letter in the mail two months in advance. If you don't receive a letter, please call our office to schedule the follow-up appointment.

## 2014-01-15 NOTE — Progress Notes (Signed)
Patient Care Team: Dorrene GermanEdwin A Avbuere, MD as PCP - General (Internal Medicine)   HPI  Dennis Brock is a 58 y.o. male Seem to try to establish pacemaker followup. [place] of bradycardia. He had sleep apnea with nocturnal asystole. I don't know whether these were related in terms of the indication for pacing. He has lost about 80 pounds and he is snoring much less.  He has a history of chronic low back pain with multiple surgeries.  He denies shortness of breath or chest discomfort with ambulation. He's had no edema.  Has a history of a prior stroke  He notes that recently his amlodipine dose was increased from 5--10 and his lisinopril the elimination of the diuretic.  Echocardiogram 10/13 demonstrates normal left ventricular function mild LVH biatrial enlargement and mildly dilated aortic root  Past Medical History  Diagnosis Date  . Hypertension     amlodipine, lisinopril  . Stroke     was [place] aspirin before stroke[place]  [place].   Marland Kitchen. Hyperlipidemia     zocor 40mg   . Tobacco abuse     14-15 pack years at 2 packs per week  . Sick sinus syndrome     s/p pacemaker, no defibrillator  . GERD (gastroesophageal reflux disease)   . Chronic pain syndrome     on methadone and percocet  . Seasonal allergies   . Asthma   . Constipation   . Chronic kidney disease   . Reflux   . Arthritis     Past Surgical History  Procedure Laterality Date  . Pacemaker insertion    . Back surgery      5 back surgeries  . Hand surgery      3 broken bones  . Bunionectomy      right foot    Current Outpatient Prescriptions  Medication Sig Dispense Refill  . albuterol (PROVENTIL HFA;VENTOLIN HFA) 108 (90 BASE) MCG/ACT inhaler Inhale 2 puffs into the lungs 2 (two) times daily.      Marland Kitchen. amLODipine (NORVASC) 10 MG tablet Take 10 mg by mouth daily.      Marland Kitchen. atorvastatin (LIPITOR) 20 MG tablet Take 20 mg by mouth daily.        . benzonatate (TESSALON) 100 MG capsule Take 1 capsule (100 mg total) by mouth every 8 (eight) hours.  21 capsule  0  . budesonide-formoterol (SYMBICORT) 160-4.5 MCG/ACT inhaler Inhale 2 puffs into the lungs daily.      . clopidogrel (PLAVIX) 75 MG tablet Take 1 tablet (75 mg total) by mouth daily with breakfast.  30 tablet  3  . doxycycline (VIBRAMYCIN) 100 MG capsule Take 1 capsule (100 mg total) by mouth 2 (two) times daily.  20 capsule  0  . lisinopril (PRINIVIL,ZESTRIL) 10 MG tablet Take 10 mg by mouth daily.      Marland Kitchen. omeprazole (PRILOSEC) 20 MG capsule Take 1 capsule (20 mg total) by mouth daily.  30 capsule  3   No current facility-administered medications for this visit.    Allergies  Allergen Reactions  . Dilaudid [Hydromorphone] Itching   Widower x2 years. Wife died of cancer. Retired Naval architecttruck driver Currently living with his sister   Review of Systems negative except from HPI and PMH  Physical Exam BP 108/75  Pulse 72  Ht 6\' 1"  (1.854 m)  Wt 309 lb (140.161 kg)  BMI 40.78 kg/m2 Well developed and  well nourished in no acute distress HENT normal E scleral and icterus clear Neck Supple JVP flat; carotids brisk and full Clear to ausculation Regular rate and rhythm, +S4 /6 murumur Soft with active bowel sounds No clubbing cyanosis  Mild Edema Alert and oriented, grossly normal motor and sensory function Skin Warm and Dry  ECG demonstrates sinus rhythm at 74 intervals 17/10/38 Nonspecific T wave changes  Assessment and  Plan  Sinus node dysfunction  Pacemaker-[employer] ([place]plant[place] in [place] The patient's device was interrogated.  The information was reviewed. No changes were made in the programming.     Atrial fibrillation-newly diagnosed  TIA  Chronic low back pain  Edema     he is doing relatively well. With the intercurrent diagnosis of atrial fibrillation prior TIA we'll plan to discontinue his Plavix and begin him on apixaban. (Renal  function was nearly normal 5/15)  We'll plan to see him in 6 months.  With his lower extremity edema, we'll decrease his amlodipine to 10--5. This may resolve edema without having to add diuretic. His blood pressure today is well-controlled.

## 2014-01-16 ENCOUNTER — Encounter: Payer: Self-pay | Admitting: Internal Medicine

## 2014-02-21 ENCOUNTER — Emergency Department (INDEPENDENT_AMBULATORY_CARE_PROVIDER_SITE_OTHER)
Admission: EM | Admit: 2014-02-21 | Discharge: 2014-02-21 | Disposition: A | Discharge status: Other Acute Inpatient Hospital | Payer: Medicare Other | Source: Home / Self Care | Attending: Family Medicine | Admitting: Family Medicine

## 2014-02-21 ENCOUNTER — Observation Stay (HOSPITAL_COMMUNITY)
Admission: EM | Admit: 2014-02-21 | Discharge: 2014-02-23 | Disposition: A | Discharge status: Home / Self Care | Payer: Medicare Other | Attending: Internal Medicine | Admitting: Internal Medicine

## 2014-02-21 ENCOUNTER — Emergency Department (HOSPITAL_COMMUNITY): Payer: Medicare Other

## 2014-02-21 ENCOUNTER — Encounter (HOSPITAL_COMMUNITY): Payer: Self-pay | Admitting: Emergency Medicine

## 2014-02-21 DIAGNOSIS — Z8673 Personal history of transient ischemic attack (TIA), and cerebral infarction without residual deficits: Secondary | ICD-10-CM | POA: Insufficient documentation

## 2014-02-21 DIAGNOSIS — M129 Arthropathy, unspecified: Secondary | ICD-10-CM | POA: Diagnosis not present

## 2014-02-21 DIAGNOSIS — Z79899 Other long term (current) drug therapy: Secondary | ICD-10-CM | POA: Insufficient documentation

## 2014-02-21 DIAGNOSIS — R0609 Other forms of dyspnea: Secondary | ICD-10-CM

## 2014-02-21 DIAGNOSIS — R0789 Other chest pain: Secondary | ICD-10-CM | POA: Diagnosis not present

## 2014-02-21 DIAGNOSIS — I495 Sick sinus syndrome: Secondary | ICD-10-CM

## 2014-02-21 DIAGNOSIS — Z87891 Personal history of nicotine dependence: Secondary | ICD-10-CM | POA: Diagnosis not present

## 2014-02-21 DIAGNOSIS — Z950 Presence of cardiac pacemaker: Secondary | ICD-10-CM | POA: Diagnosis not present

## 2014-02-21 DIAGNOSIS — E785 Hyperlipidemia, unspecified: Secondary | ICD-10-CM | POA: Insufficient documentation

## 2014-02-21 DIAGNOSIS — I480 Paroxysmal atrial fibrillation: Secondary | ICD-10-CM | POA: Diagnosis not present

## 2014-02-21 DIAGNOSIS — R079 Chest pain, unspecified: Secondary | ICD-10-CM

## 2014-02-21 DIAGNOSIS — J449 Chronic obstructive pulmonary disease, unspecified: Secondary | ICD-10-CM | POA: Diagnosis not present

## 2014-02-21 DIAGNOSIS — K219 Gastro-esophageal reflux disease without esophagitis: Secondary | ICD-10-CM | POA: Insufficient documentation

## 2014-02-21 DIAGNOSIS — J4489 Other specified chronic obstructive pulmonary disease: Secondary | ICD-10-CM | POA: Insufficient documentation

## 2014-02-21 DIAGNOSIS — K5900 Constipation, unspecified: Secondary | ICD-10-CM | POA: Insufficient documentation

## 2014-02-21 DIAGNOSIS — G894 Chronic pain syndrome: Secondary | ICD-10-CM | POA: Diagnosis not present

## 2014-02-21 DIAGNOSIS — R0902 Hypoxemia: Secondary | ICD-10-CM | POA: Diagnosis not present

## 2014-02-21 DIAGNOSIS — I129 Hypertensive chronic kidney disease with stage 1 through stage 4 chronic kidney disease, or unspecified chronic kidney disease: Secondary | ICD-10-CM | POA: Diagnosis not present

## 2014-02-21 DIAGNOSIS — M519 Unspecified thoracic, thoracolumbar and lumbosacral intervertebral disc disorder: Secondary | ICD-10-CM | POA: Insufficient documentation

## 2014-02-21 DIAGNOSIS — Z7901 Long term (current) use of anticoagulants: Secondary | ICD-10-CM

## 2014-02-21 HISTORY — DX: Unspecified thoracic, thoracolumbar and lumbosacral intervertebral disc disorder: M51.9

## 2014-02-21 HISTORY — DX: Chronic obstructive pulmonary disease, unspecified: J44.9

## 2014-02-21 LAB — CBC WITH DIFFERENTIAL/PLATELET
BASOS ABS: 0 10*3/uL (ref 0.0–0.1)
BASOS PCT: 0 % (ref 0–1)
Eosinophils Absolute: 0.1 10*3/uL (ref 0.0–0.7)
Eosinophils Relative: 3 % (ref 0–5)
HEMATOCRIT: 42.3 % (ref 39.0–52.0)
Hemoglobin: 14.3 g/dL (ref 13.0–17.0)
Lymphocytes Relative: 48 % — ABNORMAL HIGH (ref 12–46)
Lymphs Abs: 2.2 10*3/uL (ref 0.7–4.0)
MCH: 29.9 pg (ref 26.0–34.0)
MCHC: 33.8 g/dL (ref 30.0–36.0)
MCV: 88.3 fL (ref 78.0–100.0)
Monocytes Absolute: 0.3 10*3/uL (ref 0.1–1.0)
Monocytes Relative: 7 % (ref 3–12)
Neutro Abs: 1.9 10*3/uL (ref 1.7–7.7)
Neutrophils Relative %: 42 % — ABNORMAL LOW (ref 43–77)
Platelets: 171 10*3/uL (ref 150–400)
RBC: 4.79 MIL/uL (ref 4.22–5.81)
RDW: 14.1 % (ref 11.5–15.5)
WBC: 4.6 10*3/uL (ref 4.0–10.5)

## 2014-02-21 LAB — I-STAT ARTERIAL BLOOD GAS, ED
ACID-BASE EXCESS: 1 mmol/L (ref 0.0–2.0)
Bicarbonate: 27.5 mEq/L — ABNORMAL HIGH (ref 20.0–24.0)
O2 SAT: 87 %
PO2 ART: 55 mmHg — AB (ref 80.0–100.0)
Patient temperature: 98.6
TCO2: 29 mmol/L (ref 0–100)
pCO2 arterial: 47.4 mmHg — ABNORMAL HIGH (ref 35.0–45.0)
pH, Arterial: 7.371 (ref 7.350–7.450)

## 2014-02-21 LAB — CBC
HCT: 40.2 % (ref 39.0–52.0)
Hemoglobin: 13.4 g/dL (ref 13.0–17.0)
MCH: 29.3 pg (ref 26.0–34.0)
MCHC: 33.3 g/dL (ref 30.0–36.0)
MCV: 87.8 fL (ref 78.0–100.0)
Platelets: 177 10*3/uL (ref 150–400)
RBC: 4.58 MIL/uL (ref 4.22–5.81)
RDW: 14.1 % (ref 11.5–15.5)
WBC: 4.2 10*3/uL (ref 4.0–10.5)

## 2014-02-21 LAB — CREATININE, SERUM
Creatinine, Ser: 1.25 mg/dL (ref 0.50–1.35)
GFR, EST AFRICAN AMERICAN: 72 mL/min — AB (ref >90)
GFR, EST NON AFRICAN AMERICAN: 62 mL/min — AB (ref >90)

## 2014-02-21 LAB — TSH: TSH: 1.85 u[IU]/mL (ref 0.350–4.500)

## 2014-02-21 LAB — BASIC METABOLIC PANEL
BUN: 11 mg/dL (ref 6–23)
CO2: 28 mEq/L (ref 19–32)
Calcium: 9.2 mg/dL (ref 8.4–10.5)
Chloride: 107 mEq/L (ref 96–112)
Creatinine, Ser: 1.26 mg/dL (ref 0.50–1.35)
GFR, EST AFRICAN AMERICAN: 72 mL/min — AB (ref >90)
GFR, EST NON AFRICAN AMERICAN: 62 mL/min — AB (ref >90)
Glucose, Bld: 96 mg/dL (ref 70–99)
Potassium: 3.9 mEq/L (ref 3.7–5.3)
SODIUM: 146 meq/L (ref 137–147)

## 2014-02-21 LAB — D-DIMER, QUANTITATIVE: D-Dimer, Quant: 0.87 ug/mL-FEU — ABNORMAL HIGH (ref 0.00–0.48)

## 2014-02-21 LAB — PRO B NATRIURETIC PEPTIDE: Pro B Natriuretic peptide (BNP): 135.2 pg/mL — ABNORMAL HIGH (ref 0–125)

## 2014-02-21 LAB — I-STAT TROPONIN, ED: Troponin i, poc: 0 ng/mL (ref 0.00–0.08)

## 2014-02-21 LAB — TROPONIN I

## 2014-02-21 MED ORDER — ALBUTEROL SULFATE (2.5 MG/3ML) 0.083% IN NEBU: 2.5000 mg | INHALATION_SOLUTION | Freq: Four times a day (QID) | RESPIRATORY_TRACT | Status: DC | PRN

## 2014-02-21 MED ORDER — PANTOPRAZOLE SODIUM 40 MG PO TBEC: 40.0000 mg | DELAYED_RELEASE_TABLET | Freq: Every day | ORAL | Status: DC

## 2014-02-21 MED ORDER — AMLODIPINE BESYLATE 5 MG PO TABS: 5.0000 mg | ORAL_TABLET | Freq: Every day | ORAL | Status: DC

## 2014-02-21 MED ORDER — HEPARIN SODIUM (PORCINE) 5000 UNIT/ML IJ SOLN: 5000.0000 [IU] | Freq: Three times a day (TID) | INTRAMUSCULAR | Status: DC

## 2014-02-21 MED ORDER — BUDESONIDE-FORMOTEROL FUMARATE 160-4.5 MCG/ACT IN AERO
2.0000 | INHALATION_SPRAY | Freq: Every day | RESPIRATORY_TRACT | Status: DC
  Administered 2014-02-22 – 2014-02-23 (×3): 2 via RESPIRATORY_TRACT
  Filled 2014-02-21: qty 6

## 2014-02-21 MED ORDER — ONDANSETRON HCL 4 MG/2ML IJ SOLN
INTRAMUSCULAR | Status: AC
  Administered 2014-02-21: 4 mg via INTRAVENOUS
  Filled 2014-02-21: qty 2

## 2014-02-21 MED ORDER — NITROGLYCERIN 0.4 MG SL SUBL
SUBLINGUAL_TABLET | SUBLINGUAL | Status: AC
  Filled 2014-02-21: qty 1

## 2014-02-21 MED ORDER — NITROGLYCERIN 0.4 MG SL SUBL
0.4000 mg | SUBLINGUAL_TABLET | SUBLINGUAL | Status: DC | PRN
  Administered 2014-02-21: 0.4 mg via SUBLINGUAL

## 2014-02-21 MED ORDER — ASPIRIN 325 MG PO TABS
325.0000 mg | ORAL_TABLET | ORAL | Status: AC
  Administered 2014-02-21: 325 mg via ORAL
  Filled 2014-02-21: qty 1

## 2014-02-21 MED ORDER — ALBUTEROL SULFATE HFA 108 (90 BASE) MCG/ACT IN AERS: 2.0000 | INHALATION_SPRAY | Freq: Four times a day (QID) | RESPIRATORY_TRACT | Status: DC | PRN

## 2014-02-21 MED ORDER — POLYETHYLENE GLYCOL 3350 17 G PO PACK
17.0000 g | PACK | Freq: Every day | ORAL | Status: DC
  Administered 2014-02-23: 17 g via ORAL
  Filled 2014-02-21 (×3): qty 1

## 2014-02-21 MED ORDER — GABAPENTIN 100 MG PO CAPS
100.0000 mg | ORAL_CAPSULE | Freq: Three times a day (TID) | ORAL | Status: DC
  Administered 2014-02-21 – 2014-02-23 (×5): 100 mg via ORAL
  Filled 2014-02-21 (×7): qty 1

## 2014-02-21 MED ORDER — FUROSEMIDE 10 MG/ML IJ SOLN
20.0000 mg | Freq: Once | INTRAMUSCULAR | Status: AC
  Administered 2014-02-21: 20 mg via INTRAVENOUS
  Filled 2014-02-21: qty 2

## 2014-02-21 MED ORDER — SODIUM CHLORIDE 0.9 % IV SOLN
INTRAVENOUS | Status: DC
  Administered 2014-02-21: 23:00:00 via INTRAVENOUS

## 2014-02-21 MED ORDER — MORPHINE SULFATE 2 MG/ML IJ SOLN
0.5000 mg | INTRAMUSCULAR | Status: DC | PRN
  Administered 2014-02-21: 1 mg via INTRAVENOUS
  Administered 2014-02-22: 0.5 mg via INTRAVENOUS
  Filled 2014-02-21 (×2): qty 1

## 2014-02-21 MED ORDER — NITROGLYCERIN 0.4 MG SL SUBL
0.4000 mg | SUBLINGUAL_TABLET | SUBLINGUAL | Status: DC | PRN
  Administered 2014-02-21 (×2): 0.4 mg via SUBLINGUAL
  Filled 2014-02-21: qty 1

## 2014-02-21 MED ORDER — AMLODIPINE BESYLATE 5 MG PO TABS
5.0000 mg | ORAL_TABLET | Freq: Every day | ORAL | Status: DC
  Administered 2014-02-22 – 2014-02-23 (×2): 5 mg via ORAL
  Filled 2014-02-21 (×2): qty 1

## 2014-02-21 MED ORDER — ONDANSETRON HCL 4 MG/2ML IJ SOLN
4.0000 mg | Freq: Once | INTRAMUSCULAR | Status: AC
  Administered 2014-02-21: 4 mg via INTRAVENOUS

## 2014-02-21 MED ORDER — ATORVASTATIN CALCIUM 20 MG PO TABS
20.0000 mg | ORAL_TABLET | Freq: Every day | ORAL | Status: DC
  Administered 2014-02-22 – 2014-02-23 (×2): 20 mg via ORAL
  Filled 2014-02-21 (×3): qty 1

## 2014-02-21 MED ORDER — DOXYCYCLINE HYCLATE 100 MG IV SOLR
100.0000 mg | Freq: Two times a day (BID) | INTRAVENOUS | Status: DC
  Administered 2014-02-21 – 2014-02-23 (×4): 100 mg via INTRAVENOUS
  Filled 2014-02-21 (×5): qty 100

## 2014-02-21 MED ORDER — ALBUTEROL SULFATE (2.5 MG/3ML) 0.083% IN NEBU
5.0000 mg | INHALATION_SOLUTION | Freq: Once | RESPIRATORY_TRACT | Status: AC
  Administered 2014-02-21: 5 mg via RESPIRATORY_TRACT
  Filled 2014-02-21: qty 6

## 2014-02-21 MED ORDER — BENZONATATE 100 MG PO CAPS
100.0000 mg | ORAL_CAPSULE | Freq: Three times a day (TID) | ORAL | Status: DC | PRN
  Filled 2014-02-21: qty 1

## 2014-02-21 MED ORDER — ASPIRIN EC 325 MG PO TBEC
325.0000 mg | DELAYED_RELEASE_TABLET | Freq: Every day | ORAL | Status: DC
  Administered 2014-02-22 – 2014-02-23 (×2): 325 mg via ORAL
  Filled 2014-02-21 (×2): qty 1

## 2014-02-21 MED ORDER — SODIUM CHLORIDE 0.9 % IV SOLN
Freq: Once | INTRAVENOUS | Status: AC
  Administered 2014-02-21: 13:00:00 via INTRAVENOUS

## 2014-02-21 MED ORDER — PANTOPRAZOLE SODIUM 40 MG PO TBEC
40.0000 mg | DELAYED_RELEASE_TABLET | Freq: Every day | ORAL | Status: DC
  Administered 2014-02-22 – 2014-02-23 (×2): 40 mg via ORAL
  Filled 2014-02-21 (×2): qty 1

## 2014-02-21 MED ORDER — ASPIRIN 325 MG PO TABS
325.0000 mg | ORAL_TABLET | Freq: Once | ORAL | Status: AC
  Administered 2014-02-21: 325 mg via ORAL
  Filled 2014-02-21: qty 1

## 2014-02-21 MED ORDER — LISINOPRIL 10 MG PO TABS
10.0000 mg | ORAL_TABLET | Freq: Every day | ORAL | Status: DC
  Administered 2014-02-22 – 2014-02-23 (×2): 10 mg via ORAL
  Filled 2014-02-21 (×2): qty 1

## 2014-02-21 MED ORDER — APIXABAN 5 MG PO TABS
5.0000 mg | ORAL_TABLET | Freq: Two times a day (BID) | ORAL | Status: DC
  Administered 2014-02-21 – 2014-02-23 (×4): 5 mg via ORAL
  Filled 2014-02-21 (×6): qty 1

## 2014-02-21 MED ORDER — LISINOPRIL 10 MG PO TABS: 10.0000 mg | ORAL_TABLET | Freq: Every day | ORAL | Status: DC

## 2014-02-21 MED ORDER — IPRATROPIUM BROMIDE 0.02 % IN SOLN
0.5000 mg | Freq: Once | RESPIRATORY_TRACT | Status: AC
  Administered 2014-02-21: 0.5 mg via RESPIRATORY_TRACT
  Filled 2014-02-21: qty 2.5

## 2014-02-21 MED ORDER — SODIUM CHLORIDE 0.9 % IJ SOLN
3.0000 mL | Freq: Two times a day (BID) | INTRAMUSCULAR | Status: DC
  Administered 2014-02-22 – 2014-02-23 (×2): 3 mL via INTRAVENOUS

## 2014-02-21 MED ORDER — METHYLPREDNISOLONE SODIUM SUCC 125 MG IJ SOLR
125.0000 mg | Freq: Two times a day (BID) | INTRAMUSCULAR | Status: DC
  Administered 2014-02-21 – 2014-02-23 (×4): 125 mg via INTRAVENOUS
  Filled 2014-02-21 (×6): qty 2

## 2014-02-21 MED ORDER — IOHEXOL 350 MG/ML SOLN
100.0000 mL | Freq: Once | INTRAVENOUS | Status: AC | PRN
  Administered 2014-02-21: 100 mL via INTRAVENOUS

## 2014-02-21 MED ORDER — MORPHINE SULFATE 4 MG/ML IJ SOLN
4.0000 mg | Freq: Once | INTRAMUSCULAR | Status: AC
  Administered 2014-02-21: 4 mg via INTRAVENOUS
  Filled 2014-02-21: qty 1

## 2014-02-21 NOTE — ED Notes (Signed)
Patient states pain level is 3 or 4 now.

## 2014-02-21 NOTE — Consult Note (Addendum)
Cardiology Consult Note  Admit date: 02/21/2014 Name: Dennis Brock 58 y.o.  male DOB:  09/19/1955 MRN:  [id number]  Today's date:  02/21/2014  Referring Physician:    Teaching service  Primary Physician:  Dr. Felecia Shelling  Reason for Consultation:   Chest pain  IMPRESSIONS: 1. Chest pain with atypical features-the pain is been present for 3 weeks and is pleuritic in nature and non-positional. I do not think that this is ischemic syndrome or an acute coronary syndrome. Differential possibilities could include musculoskeletal pain, pulmonary pain such as due to infection or bronchitis or pericarditis. The other differential possibility would include some sort of pain coming from thoracic or cervical disc. 2. History of sick sinus syndrome with indwelling pacemaker  3. Hypertensive heart disease 4. Hyperlipidemia 5. Morbid obesity 6. Chronic pain syndrome previously on methadone 7. Significant lumbar disc disease 8. Prior history of stroke with possible aneurysm that may be developing 9. History of COPD and tobacco abuse-currently not smoking 10. Recent addition of Eliquus to regimen 11. PAF  RECOMMENDATION:  Cycle cardiac enzymes. I have a low index of suspicion for an acute coronary syndrome. Obtain echocardiogram in the morning to be sure he does not have a pericardial effusion. Serial EKGs. He does have a history of a chronic pain syndrome and has previously been on methadone. If troponins are negative I would not begin intravenous heparin.  HISTORY:  This 58 year old male recently moved back here from [place]. He has a history of hypertensive heart disease, hyperlipidemia and obesity. He has a prior history of stroke that occurred in 2013 while he was here. He evidently had a CT angiogram of his head and neck that showed a possible developing aneurysm blood no definite aneurysm was noted. He has a history of a heart attack in the past and was told he had a problem on the bottom  part of his heart and mentioned a catheterization. 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 He recently established with Dr. Graciela Husbands and was seen by him about a month ago and felt to have PAF and was placed on Eliquus and taken off of Plavix. He presented to the emergency room with a 3 week history of sharp chest pain. He describes the pain is sharp midsternal as if someone is stabbing him with a knife and he would be intermittent and would occur on off the past 3 weeks. It became worse today and he went to urgent care and the history was obtained there that it went into his arm. ? Relief with NTG. He also noted that he would have pain radiating through to his mid back and also pain if he bent his neck backwards. He stated that it hurts take a deep breath and it was not positional in nature. He did not describe the pain as pressure-like or heaviness. He had moderate shortness of breath with the pain. In the emergency room he had a CT angiogram that showed evidence of pulmonary embolus and he was noted to have no significant coronary calcification. He normally has no PND orthopnea. He has had a mild amount of edema recently. He has complained of nocturia as well as urinary frequency.  Past Medical History  Diagnosis Date  . Hypertension     amlodipine, lisinopril  . Stroke     was on aspirin before stroke. July 2012 in [place].  sent home aspirin.   Marland Kitchen Hyperlipidemia     zocor 40mg   . Sick sinus syndrome  s/p pacemaker, no defibrillator  . GERD (gastroesophageal reflux disease)   . Chronic pain syndrome   . Seasonal allergies   . Asthma   . Chronic kidney disease   . GERD (gastroesophageal reflux disease)   . Arthritis   . COPD (chronic obstructive pulmonary disease)   . Lumbar disc disease       Past Surgical History  Procedure Laterality Date  . Pacemaker insertion    . Lumbar laminectomy      5 back surgeries  . Hand surgery      3 broken bones  . Bunionectomy      right foot      Allergies:  is allergic to dilaudid.   Medications: Prior to Admission medications   Medication Sig Start Date End Date Taking? Authorizing Kynlee Koenigsberg  albuterol (PROVENTIL HFA;VENTOLIN HFA) 108 (90 BASE) MCG/ACT inhaler Inhale 2 puffs into the lungs every 6 (six) hours as needed for wheezing or shortness of breath.    Yes Historical Aria Jarrard, MD  amLODipine (NORVASC) 5 MG tablet Take 1 tablet (5 mg total) by mouth daily. 01/15/14  Yes Duke SalviaSteven C Klein, MD  apixaban (ELIQUIS) 5 MG TABS tablet Take 1 tablet (5 mg total) by mouth 2 (two) times daily. 01/15/14  Yes Duke SalviaSteven C Klein, MD  atorvastatin (LIPITOR) 20 MG tablet Take 20 mg by mouth daily.   Yes Historical Korrine Sicard, MD  benzonatate (TESSALON) 100 MG capsule Take 100 mg by mouth every 8 (eight) hours as needed for cough.   Yes Historical Chandel Zaun, MD  budesonide-formoterol (SYMBICORT) 160-4.5 MCG/ACT inhaler Inhale 2 puffs into the lungs daily.   Yes Historical Jacon Whetzel, MD  gabapentin (NEURONTIN) 100 MG capsule Take 100 mg by mouth 3 (three) times daily.   Yes Historical Haadi Santellan, MD  lisinopril (PRINIVIL,ZESTRIL) 10 MG tablet Take 10 mg by mouth daily.   Yes Historical Orbin Mayeux, MD  omeprazole (PRILOSEC) 20 MG capsule Take 1 capsule (20 mg total) by mouth daily. 06/02/12  Yes Bryan R Hess, DO  polyethylene glycol (MIRALAX / GLYCOLAX) packet Take 17 g by mouth daily.   Yes Historical Lucresia Simic, MD    Family History: Family Status  Relation Status Death Age  . Father Deceased     throat cancer  . Mother Alive     CHF  . Sister Deceased     died in a fire  . Sister Alive   . Sister Alive   . Sister Alive   . Sister Alive   . Sister Alive     Social History:   reports that he quit smoking about 3 months ago. He does not have any smokeless tobacco history on file. He reports that he does not drink alcohol or use illicit drugs.   History   Social History Narrative   Recently moved from [place] 2 months ago. Has not established with  primary care doctor.    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    Walk every other day 1 mile.       Currently living with sister but looking for another residence.    On disability. Medicare.     Review of Systems: Has been obese for many years. Complains of chronic pain involving his back and has had multiple lumbar laminectomies as well as some infection there. He has a lot of difficulty with shortness of breath and has been using different types of nebulizers. He complains of nocturia and frequency. He has been able to  walk some without angina. He has some mild constipation. 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 He has significant pain involving his knees. He has some mild numbness of his feet. Other than as noted above the remainder of the review of systems is unremarkable.  Physical Exam: BP 124/85  Pulse 52  Temp(Src) 97.9 F (36.6 C) (Oral)  Resp 12  Ht 6\' 1"  (1.854 m)  Wt 140.615 kg (310 lb)  BMI 40.91 kg/m2  SpO2 100%  General appearance: Large obese black male complaining of chest pain with inspiration but appears in no acute distress Head: Normocephalic, without obvious abnormality, atraumatic Eyes: conjunctivae/corneas clear. PERRL, EOM's intact. Fundi benign. Neck: no adenopathy, no carotid bruit, no JVD and supple, symmetrical, trachea midline Lungs: clear to auscultation bilaterally Heart: regular rate and rhythm, S1, S2 normal, no murmur, click, rub or gallop Abdomen: soft, non-tender; bowel sounds normal; no masses,  no organomegaly Rectal: deferred Extremities: 1+ edema Pulses: 2+ and symmetric Skin: Skin color, texture, turgor normal. No rashes or lesions Neurologic: Grossly normal  Labs: CBC  Recent Labs  02/21/14 1358  WBC 4.2  RBC 4.58  HGB 13.4  HCT 40.2  PLT 177  MCV 87.8  MCH 29.3  MCHC 33.3  RDW 14.1   CMP   Recent Labs   02/21/14 1358  NA 146  K 3.9  CL 107  CO2 28  GLUCOSE 96  BUN 11  CREATININE 1.26  CALCIUM 9.2  GFRNONAA 62*  GFRAA 72*   BNP (last 3 results)  Recent Labs  01/09/14 1047 02/21/14 1359  PROBNP 91.7 135.2*   Cardiac Panel (last 3 results) Troponin (Point of Care Test)  Recent Labs  02/21/14 1454  TROPIPOC 0.00     Radiology: Cardiomegaly, 2-lead pacemaker seen, questionable vascular congestion  EKG: Sinus rhythm with PACs, no ischemic ST changes, small inferior Q waves  Signed:  W. Ashley RoyaltySpencer Tilley, Jr. MD [facility]   Cardiology Consultant  02/21/2014, 8:09 PM

## 2014-02-21 NOTE — ED Provider Notes (Signed)
Dennis HeldJerry Lee Brock is a 58 y.o. male who presents to Urgent Care today for chest pain. Patient notes a 3 week history of sharp anterior chest pain. However yesterday evening the pain became worse and radiating to the left arm. The pain is nonexertional. The pain is worse with deep inspiration or coughing. He notes mild shortness of breath. He denies any fevers or chills nausea vomiting or diarrhea. He has a history of coronary artery disease, true vascular disease, and sick sinus syndrome currently with pacemaker. He denies any fevers or chills nausea vomiting or diarrhea.   Past Medical History  Diagnosis Date  . Hypertension     amlodipine, lisinopril  . Stroke     was on aspirin before stroke. July 2012 in [place].  sent home aspirin.   Marland Kitchen. Hyperlipidemia     zocor 40mg   . Tobacco abuse     14-15 pack years at 2 packs per week  . Sick sinus syndrome     s/p pacemaker, no defibrillator  . GERD (gastroesophageal reflux disease)   . Chronic pain syndrome     on methadone and percocet  . Seasonal allergies   . Asthma   . Constipation   . Chronic kidney disease   . Reflux   . Arthritis    History  Substance Use Topics  . Smoking status: Former Smoker    Quit date: 11/21/2013  . Smokeless tobacco: Not on file  . Alcohol Use: No   ROS as above Medications: No current facility-administered medications for this encounter.   Current Outpatient Prescriptions  Medication Sig Dispense Refill  . albuterol (PROVENTIL HFA;VENTOLIN HFA) 108 (90 BASE) MCG/ACT inhaler Inhale 2 puffs into the lungs 2 (two) times daily.      Marland Kitchen. amLODipine (NORVASC) 5 MG tablet Take 1 tablet (5 mg total) by mouth daily.  30 tablet  6  . apixaban (ELIQUIS) 5 MG TABS tablet Take 1 tablet (5 mg total) by mouth 2 (two) times daily.  60 tablet  0  . apixaban (ELIQUIS) 5 MG TABS tablet Take 1 tablet (5 mg total) by mouth 2 (two) times daily.  60 tablet  5  . atorvastatin (LIPITOR) 20 MG tablet Take 20 mg by mouth daily.       . benzonatate (TESSALON) 100 MG capsule Take 1 capsule (100 mg total) by mouth every 8 (eight) hours.  21 capsule  0  . budesonide-formoterol (SYMBICORT) 160-4.5 MCG/ACT inhaler Inhale 2 puffs into the lungs daily.      Marland Kitchen. lisinopril (PRINIVIL,ZESTRIL) 10 MG tablet Take 10 mg by mouth daily.      Marland Kitchen. omeprazole (PRILOSEC) 20 MG capsule Take 1 capsule (20 mg total) by mouth daily.  30 capsule  3  . doxycycline (VIBRAMYCIN) 100 MG capsule Take 1 capsule (100 mg total) by mouth 2 (two) times daily.  20 capsule  0    Exam:  BP 132/100  Pulse 73  Temp(Src) 97.6 F (36.4 C) (Oral)  Resp 18  SpO2 95% Gen: Well NAD obese HEENT: EOMI,  MMM Lungs: Normal work of breathing. CTABL Heart: RRR no MRG Abd: NABS, Soft. NT, ND Exts: Brisk capillary refill, warm and well perfused. Trace pitting edema bilaterally  Twelve-lead EKG: Sinus rhythm at 67 beats per minute with PAC. Q waves present in leads II, and III. no ST segment elevation or depression.  No results found for this or any previous visit (from the past 24 hour(s)). No results found.  Assessment and Plan: 58  y.o. male with central chest pain worsening and radiating to the left arm in the setting of significant underlying coronary artery disease. This is concerned for acute coronary syndrome. Additional possibilities are esophagitis, bronchitis/pneumonia.  Plan to start IV administered nitroglycerin and oxygen therapy and transfer to the emergency room via EMS.  Discussed warning signs or symptoms. Please see discharge instructions. Patient expresses understanding.    Rodolph BongEvan S Corey, MD 02/21/14 [phone]

## 2014-02-21 NOTE — ED Provider Notes (Addendum)
CSN: [id number]     Arrival date & time 02/21/14  1316 History   First MD Initiated Contact with Patient 02/21/14 1505     No chief complaint on file.    (Consider location/radiation/quality/duration/timing/severity/associated sxs/prior Treatment) The history is provided by the patient.      Dennis Brock is a(n) 58 y.o. male [facility]. Past medical history of hypertension, previous stroke, hyperlipidemia, sick sinus syndrome status post pacemaker, COPD and chronic kidney disease. The patient has had 3 weeks of chest and upper back pain that radiates into the bilateral arms. He states that it is worse when lying on either side were walking. He finds some relief when lying flat. He has pain with deep inspiration. He has noticed worsening dyspnea on exertion. His pain is constant and at times worse. He states at times he finds relief. He has taken Tylenol without resolution of his pain or symptoms. He states that today he became acutely worse and he's noticed numbness and tingling of her left arm. It is not worsened with movement of the arms or neck. He denies any unilateral leg swelling, pain, immobilization, recent procedures.  Past Medical History  Diagnosis Date  . Hypertension     amlodipine, lisinopril  . Stroke     was on aspirin before stroke. July 2012 in [place].  sent home aspirin.   Marland Kitchen. Hyperlipidemia     zocor 40mg   . Tobacco abuse     14-15 pack years at 2 packs per week  . Sick sinus syndrome     s/p pacemaker, no defibrillator  . GERD (gastroesophageal reflux disease)   . Chronic pain syndrome     on methadone and percocet  . Seasonal allergies   . Asthma   . Constipation   . Chronic kidney disease   . Reflux   . Arthritis    Past Surgical History  Procedure Laterality Date  . Pacemaker insertion    . Back surgery      5 back surgeries  . Hand surgery      3 broken bones  . Bunionectomy      right foot   Family  History  Problem Relation Age of Onset  . Stroke      grandfather and uncle died of stroke  . Heart attack      uncle  . Cancer Father     throat  . Cancer - Other Father     throat  . Transient ischemic attack Mother     487 and doing well  . Heart disease Mother   . COPD Mother   . Hypertension Sister   . COPD Sister   . Hypertension Sister   . COPD Sister   . Hypertension Sister   . COPD Sister   . Hypertension Sister   . COPD Sister   . Hypertension Sister   . COPD Sister    History  Substance Use Topics  . Smoking status: Former Smoker    Quit date: 11/21/2013  . Smokeless tobacco: Not on file  . Alcohol Use: No    Review of Systems  Ten systems reviewed and are negative for acute change, except as noted in the HPI.    Allergies  Dilaudid  Home Medications   Prior to Admission medications   Medication Sig Start Date End Date Taking? Authorizing Ilhan Debenedetto  albuterol (PROVENTIL HFA;VENTOLIN HFA) 108 (90 BASE) MCG/ACT inhaler Inhale 2 puffs  into the lungs every 6 (six) hours as needed for wheezing or shortness of breath.    Yes Historical Ras Kollman, MD  amLODipine (NORVASC) 5 MG tablet Take 1 tablet (5 mg total) by mouth daily. 01/15/14  Yes Duke Salvia, MD  apixaban (ELIQUIS) 5 MG TABS tablet Take 1 tablet (5 mg total) by mouth 2 (two) times daily. 01/15/14  Yes Duke Salvia, MD  atorvastatin (LIPITOR) 20 MG tablet Take 20 mg by mouth daily.   Yes Historical Dessire Grimes, MD  benzonatate (TESSALON) 100 MG capsule Take 100 mg by mouth every 8 (eight) hours as needed for cough.   Yes Historical Nakeyia Menden, MD  budesonide-formoterol (SYMBICORT) 160-4.5 MCG/ACT inhaler Inhale 2 puffs into the lungs daily.   Yes Historical Terrion Gencarelli, MD  gabapentin (NEURONTIN) 100 MG capsule Take 100 mg by mouth 3 (three) times daily.   Yes Historical Emmalia Heyboer, MD  lisinopril (PRINIVIL,ZESTRIL) 10 MG tablet Take 10 mg by mouth daily.   Yes Historical Dorthia Tout, MD  omeprazole (PRILOSEC) 20  MG capsule Take 1 capsule (20 mg total) by mouth daily. 06/02/12  Yes Bryan R Hess, DO  polyethylene glycol (MIRALAX / GLYCOLAX) packet Take 17 g by mouth daily.   Yes Historical Dayanira Giovannetti, MD   BP 121/75  Pulse 55  Temp(Src) 97.9 F (36.6 C) (Oral)  Resp 20  Ht 6\' 1"  (1.854 m)  Wt 310 lb (140.615 kg)  BMI 40.91 kg/m2  SpO2 95% Physical Exam  Nursing note and vitals reviewed. Constitutional: He appears well-developed and well-nourished. No distress.  HENT:  Head: Normocephalic and atraumatic.  Eyes: Conjunctivae are normal. No scleral icterus.  Neck: Normal range of motion. Neck supple.  Cardiovascular: Normal rate, regular rhythm and normal heart sounds.   Pulmonary/Chest: Effort normal. No respiratory distress.  Abdominal: Soft. There is no tenderness.  Musculoskeletal: He exhibits no edema.  Neurological: He is alert.  Skin: Skin is warm and dry. He is not diaphoretic.  Psychiatric: His behavior is normal.    ED Course  Procedures (including critical care time) Labs Review Labs Reviewed  BASIC METABOLIC PANEL - Abnormal; Notable for the following:    GFR calc non Af Amer 62 (*)    GFR calc Af Amer 72 (*)    All other components within normal limits  PRO B NATRIURETIC PEPTIDE - Abnormal; Notable for the following:    Pro B Natriuretic peptide (BNP) 135.2 (*)    All other components within normal limits  CBC  I-STAT TROPOININ, ED    Imaging Review Dg Chest 2 View  02/21/2014   CLINICAL DATA:  Chest pain. Short of breath. Asthma. COPD. Pacemaker.  EXAM: CHEST  2 VIEW  COMPARISON:  01/09/2014.  FINDINGS: Cardiomegaly. Stable appearance of the cardiopericardial silhouette and mediastinal contours. Dual lead LEFT subclavian cardiac pacemaker. No airspace disease or pleural effusion. Pulmonary vascular congestion is present with increased cephalization of pulmonary blood flow compared to prior exam suggesting volume overload.  IMPRESSION: Cardiomegaly and pulmonary vascular  congestion.   Electronically Signed   By: Andreas Newport M.D.   On: 02/21/2014 15:32     EKG Interpretation None      MDM   Final diagnoses:  Other chest pain  DOE (dyspnea on exertion)  Hypoxia       4:46 PM BP 127/55  Pulse 56  Temp(Src) 97.9 F (36.6 C) (Oral)  Resp 17  Ht 6\' 1"  (1.854 m)  Wt 310 lb (140.615 kg)  BMI 40.91 kg/m2  SpO2  88% Patient taken off of 02, desturated to 88%. Ekg is unchanged and initial troponin neg.    6:34 PM BP 111/65  Pulse 55  Temp(Src) 97.9 F (36.6 C) (Oral)  Resp 17  Ht 6\' 1"  (1.854 m)  Wt 310 lb (140.615 kg)  BMI 40.91 kg/m2  SpO2 97% Patient resting comfortable on 2 L. CTA negative for PE however does confirm his pulmonary vascular congestion. Ordering 20 mg IV Lasix. His BMP is unremarkable. Patient does not have a history of home oxygen use. I do feel he may need to cardiac rule out. Also assessment for his hypoxia which appears to be new.  6:41 PM BP 111/65  Pulse 55  Temp(Src) 97.9 F (36.6 C) (Oral)  Resp 17  Ht 6\' 1"  (1.854 m)  Wt 310 lb (140.615 kg)  BMI 40.91 kg/m2  SpO2 97% Patient will be admitted for CP r/o  New onset hypoxia. Unsure of etiology although he does have underlying lung disease. Mild expiratory wheeze. VBG peniding and I will try duoneb treatment.  Echocardiogram 10/13 demonstrates normal left ventricular function mild LVH biatrial enlargement and mildly dilated aortic root. Patient is table for admission and accepted by Dr. Oran Reinnewton   Abigail Harris, PA-C 02/24/14 1517  Arthor CaptainAbigail Harris, PA-C 03/05/14 2151

## 2014-02-21 NOTE — ED Notes (Signed)
Hospitalist consult, Dr. Alvester MorinNewton at bedside.

## 2014-02-21 NOTE — ED Notes (Signed)
Pt reports 3 weeks of sharp anterior chest pain.  Last night he started having pain in both upper arms.  He denies nausea or vomiting.  Skin warm/  dry, color good

## 2014-02-21 NOTE — ED Notes (Signed)
Pt reports good relief of pain from nitro

## 2014-02-21 NOTE — H&P (Addendum)
Hospitalist Admission History and Physical  Patient name: Dennis Brock Medical record number: [id number] Date of birth: 06-10-1956 Age: 58 y.o. Gender: male  Primary Care Dennis Brock: Dennis German, MD  Chief Complaint: Chest pain  History of Present Illness:This is a 58 y.o. year old male with noted prior history of CVA, hypertension, COPD, atrial fibrillation on glucose, sick sinus syndrome status post pacemaker presenting with chest pain. Patient states he's had intermittent chest pain over the past 3 weeks acutely became worse and change probably she earlier today. Prior to today patient had generalized chest pain that was worse with cough deep breathing as well as some productive sputum. Patient says he woke up today with severe crushing chest pain that radiated to the back was associated bilateral arm over further right tingling. Mild associated nause symptoms.  Presents to the ER with symptoms. Afebrile. Hemodynamically stable. O2 sats in the upper 80s to low 90s on room air. Blood work within normal limits. Had a CT scan that was negative for pulmonary embolus though did show some mild cardiomegaly and pulmonary vascular congestion. ProBNP 135. Trop neg x1. EKG sinus rhythm with mild 1 waves in lateral leads. Heart score 5. Was given nitroglycerin strength x2 in the ER with marked improvement of chest pain.  Assessment and Plan: Dennis Brock is a 58 y.o. year old male presenting with chest pain  Chest pain: Fairly mixed clinical picture patient no cardiovascular risk factors. There does seem to be an exertional and costochondritic component to symptoms. Cardiology consult. Appreciate input. We'll cycle cardiac enzymes. Her risk stratification labs. 2-D echocardiogram. Full dose aspirin. As needed nitroglycerin. Continue to follow.  Transient hypoxia: CT angiogram negative for PE. Suspect there is a concomitant subclinical COPD exacerbation contributing to symptoms. We'll place on  Solu-Medrol and doxycycline for treatment. Supplemental oxygen prn. Continue to follow.  Hypertension/A. fib: Hemodynamically stable on presentation. Continue home regimen. Continue eliquis.    FEN/GI: heart healthy diet.  Prophylaxis: elequis  Disposition: pending further evaluation  Code Status:Full Code    Patient Active Problem List   Diagnosis Date Noted  . Chest pain 02/21/2014  . Cardiac pacemaker in situ 02/21/2014  . Morbid obesity   . Lumbar disc disease   . COPD (chronic obstructive pulmonary disease)   . Hypertensive heart disease   . History of stroke   . Hyperlipidemia   . Sick sinus syndrome   . GERD (gastroesophageal reflux disease)   . Chronic pain syndrome    Past Medical History: Past Medical History  Diagnosis Date  . Hypertension     amlodipine, lisinopril  . Stroke     was on aspirin before stroke. July 2012 in [place].  sent home aspirin.   Marland Kitchen Hyperlipidemia     zocor 40mg   . Sick sinus syndrome     s/p pacemaker, no defibrillator  . GERD (gastroesophageal reflux disease)   . Chronic pain syndrome   . Seasonal allergies   . Asthma   . Chronic kidney disease   . GERD (gastroesophageal reflux disease)   . Arthritis   . COPD (chronic obstructive pulmonary disease)   . Lumbar disc disease     Past Surgical History: Past Surgical History  Procedure Laterality Date  . Pacemaker insertion    . Lumbar laminectomy      5 back surgeries  . Hand surgery      3 broken bones  . Bunionectomy      right foot    Social  History: History   Social History  . Marital Status: Single    Spouse Name: N/A    Number of Children: N/A  . Years of Education: N/A   Occupational History  . unemployed    Social History Main Topics  . Smoking status: Former Smoker    Quit date: 11/21/2013  . Smokeless tobacco: None  . Alcohol Use: No  . Drug Use: No  . Sexual Activity: None   Other Topics Concern  . None   Social History Narrative   Recently moved  from [place] 2 months ago. Has not established with primary care doctor.    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    Walk every other day 1 mile.       Currently living with sister but looking for another residence.    On disability. Medicare.     Family History: Family History  Problem Relation Age of Onset  . Stroke      grandfather and uncle died of stroke  . Heart attack      uncle  . Cancer Father     throat  . Cancer - Other Father     throat  . Transient ischemic attack Mother     [id number] and doing well  . Heart disease Mother   . COPD Mother   . Hypertension Sister   . COPD Sister   . Hypertension Sister   . COPD Sister   . Hypertension Sister   . COPD Sister   . Hypertension Sister   . COPD Sister   . Hypertension Sister   . COPD Sister     Allergies: Allergies  Allergen Reactions  . Dilaudid [Hydromorphone] Itching    Current Facility-Administered Medications  Medication Dose Route Frequency Dennis Brock Last Rate Last Dose  . 0.9 %  sodium chloride infusion   Intravenous Continuous Dennis AlbeeSteven Newton, MD      . albuterol (PROVENTIL HFA;VENTOLIN HFA) 108 (90 BASE) MCG/ACT inhaler 2 puff  2 puff Inhalation Q6H PRN Dennis AlbeeSteven Newton, MD      . amLODipine (NORVASC) tablet 5 mg  5 mg Oral Daily Dennis AlbeeSteven Newton, MD      . apixaban Everlene Balls(ELIQUIS) tablet 5 mg  5 mg Oral BID Dennis AlbeeSteven Newton, MD      . aspirin EC tablet 325 mg  325 mg Oral Daily Dennis AlbeeSteven Newton, MD      . atorvastatin (LIPITOR) tablet 20 mg  20 mg Oral Daily Dennis AlbeeSteven Newton, MD      . benzonatate (TESSALON) capsule 100 mg  100 mg Oral Q8H PRN Dennis AlbeeSteven Newton, MD      . budesonide-formoterol [facility](SYMBICORT) 160-4.5 MCG/ACT inhaler 2 puff  2 puff Inhalation Daily Dennis AlbeeSteven Newton, MD      . gabapentin (NEURONTIN) capsule 100 mg  100 mg Oral TID Dennis AlbeeSteven Newton, MD      . lisinopril (PRINIVIL,ZESTRIL) tablet 10 mg  10 mg Oral Daily Dennis AlbeeSteven Newton, MD      . morphine 2 MG/ML injection 0.5-1 mg  0.5-1 mg Intravenous  Q2H PRN Dennis AlbeeSteven Newton, MD      . nitroGLYCERIN (NITROSTAT) SL tablet 0.4 mg  0.4 mg Sublingual Q5 min PRN Gwyneth SproutWhitney Plunkett, MD   0.4 mg at 02/21/14 1416  . pantoprazole (PROTONIX) EC tablet 40 mg  40 mg Oral Daily Dennis AlbeeSteven Newton, MD      . polyethylene glycol (MIRALAX / GLYCOLAX) packet 17 g  17 g Oral Daily Dennis AlbeeSteven Newton, MD      .  sodium chloride 0.9 % injection 3 mL  3 mL Intravenous Q12H Dennis Albee, MD       Current Outpatient Prescriptions  Medication Sig Dispense Refill  . albuterol (PROVENTIL HFA;VENTOLIN HFA) 108 (90 BASE) MCG/ACT inhaler Inhale 2 puffs into the lungs every 6 (six) hours as needed for wheezing or shortness of breath.       Marland Kitchen amLODipine (NORVASC) 5 MG tablet Take 1 tablet (5 mg total) by mouth daily.  30 tablet  6  . apixaban (ELIQUIS) 5 MG TABS tablet Take 1 tablet (5 mg total) by mouth 2 (two) times daily.  60 tablet  0  . atorvastatin (LIPITOR) 20 MG tablet Take 20 mg by mouth daily.      . benzonatate (TESSALON) 100 MG capsule Take 100 mg by mouth every 8 (eight) hours as needed for cough.      . budesonide-formoterol (SYMBICORT) 160-4.5 MCG/ACT inhaler Inhale 2 puffs into the lungs daily.      Marland Kitchen gabapentin (NEURONTIN) 100 MG capsule Take 100 mg by mouth 3 (three) times daily.      Marland Kitchen lisinopril (PRINIVIL,ZESTRIL) 10 MG tablet Take 10 mg by mouth daily.      Marland Kitchen omeprazole (PRILOSEC) 20 MG capsule Take 1 capsule (20 mg total) by mouth daily.  30 capsule  3  . polyethylene glycol (MIRALAX / GLYCOLAX) packet Take 17 g by mouth daily.       Review Of Systems: 12 point ROS negative except as noted above in HPI.  Physical Exam: Filed Vitals:   02/21/14 2030  BP: 124/84  Pulse: 57  Temp:   Resp: 16    General: alert and cooperative HEENT: PERRLA and extra ocular movement intact Heart: S1, S2 normal, no murmur, rub or gallop, regular rate and rhythm Lungs: unlabored breathing, faint wheezes. Positive anterior chest wall tenderness to palpation.  Abdomen: abdomen  is soft without significant tenderness, masses, organomegaly or guarding Extremities: extremities normal, atraumatic, no cyanosis or edema Skin:no rashes, no ecchymoses Neurology: normal without focal findings  Labs and Imaging: Lab Results  Component Value Date/Time   NA 146 02/21/2014  1:58 PM   K 3.9 02/21/2014  1:58 PM   CL 107 02/21/2014  1:58 PM   CO2 28 02/21/2014  1:58 PM   BUN 11 02/21/2014  1:58 PM   CREATININE 1.26 02/21/2014  1:58 PM   GLUCOSE 96 02/21/2014  1:58 PM   Lab Results  Component Value Date   WBC 4.2 02/21/2014   HGB 13.4 02/21/2014   HCT 40.2 02/21/2014   MCV 87.8 02/21/2014   PLT 177 02/21/2014    Dg Chest 2 View  02/21/2014   CLINICAL DATA:  Chest pain. Short of breath. Asthma. COPD. Pacemaker.  EXAM: CHEST  2 VIEW  COMPARISON:  01/09/2014.  FINDINGS: Cardiomegaly. Stable appearance of the cardiopericardial silhouette and mediastinal contours. Dual lead LEFT subclavian cardiac pacemaker. No airspace disease or pleural effusion. Pulmonary vascular congestion is present with increased cephalization of pulmonary blood flow compared to prior exam suggesting volume overload.  IMPRESSION: Cardiomegaly and pulmonary vascular congestion.   Electronically Signed   By: Andreas Newport M.D.   On: 02/21/2014 15:32   Ct Angio Chest Pe W/cm &/or Wo Cm  02/21/2014   CLINICAL DATA:  Chest pain radiating to back.  EXAM: CT ANGIOGRAPHY CHEST WITH CONTRAST  TECHNIQUE: Multidetector CT imaging of the chest was performed using the standard protocol during bolus administration of intravenous contrast. Multiplanar CT image reconstructions and  MIPs were obtained to evaluate the vascular anatomy.  CONTRAST:  100mL OMNIPAQUE IOHEXOL 350 MG/ML SOLN  COMPARISON:  Chest radiograph February 19, 2014  FINDINGS: Adequate contrast opacification of the pulmonary artery's. Main pulmonary artery is not enlarged. No pulmonary arterial filling defects to the level of the subsegmental branches.  Mild cardiomegaly,  no right heart strain. Left cardiac pacemaker with leads in right atrium and right ventricle appear Pericardium is unremarkable. Thoracic aorta is normal course and caliber, unremarkable. No lymphadenopathy by CT size criteria. Tracheobronchial tree is patent, no pneumothorax. Pulmonary venous congestion. Heterogeneous lung attenuation without pleural effusions or focal consolidations.  Included view of the abdomen is unremarkable. Visualized soft tissues and included osseous structures appear normal.  Review of the MIP images confirms the above findings.  IMPRESSION: No acute pulmonary embolism.  Mild cardiomegaly, pulmonary venous congestion with heterogeneous lung attenuation suggesting pulmonary edema/small airway disease.   Electronically Signed   By: Awilda Metroourtnay  Bloomer   On: 02/21/2014 18:29           Dennis AlbeeSteven Newton MD  Pager: [phone]

## 2014-02-21 NOTE — ED Notes (Signed)
Report at attempted x1

## 2014-02-21 NOTE — ED Notes (Signed)
[facility] called for transport. 

## 2014-02-21 NOTE — ED Notes (Signed)
Pt presents with 3 week h/o L sided chest pain.  Pt reports pain has been constant and radiates into both arms and into mid-scapula.  Pt reports pain worsened today, reports diaphoresis and nausea.

## 2014-02-21 NOTE — ED Notes (Signed)
Take pt to floor per Dr. Alvester MorinNewton, states less likely heart related chest pain per cardiologist consult; if any question contact Dr. Alvester MorinNewton.

## 2014-02-21 NOTE — ED Notes (Signed)
Respiratory tech made aware that ABG draw needed

## 2014-02-22 DIAGNOSIS — Z950 Presence of cardiac pacemaker: Secondary | ICD-10-CM

## 2014-02-22 DIAGNOSIS — I517 Cardiomegaly: Secondary | ICD-10-CM

## 2014-02-22 DIAGNOSIS — R0789 Other chest pain: Secondary | ICD-10-CM | POA: Diagnosis not present

## 2014-02-22 LAB — TROPONIN I: Troponin I: <0.3 ng/mL (ref <0.30)

## 2014-02-22 LAB — COMPREHENSIVE METABOLIC PANEL
ALBUMIN: 3.9 g/dL (ref 3.5–5.2)
ALT: 18 U/L (ref 0–53)
AST: 15 U/L (ref 0–37)
Alkaline Phosphatase: 125 U/L — ABNORMAL HIGH (ref 39–117)
BUN: 13 mg/dL (ref 6–23)
CO2: 27 mEq/L (ref 19–32)
CREATININE: 1.33 mg/dL (ref 0.50–1.35)
Calcium: 9.3 mg/dL (ref 8.4–10.5)
Chloride: 101 mEq/L (ref 96–112)
GFR calc Af Amer: 67 mL/min — ABNORMAL LOW (ref >90)
GFR calc non Af Amer: 58 mL/min — ABNORMAL LOW (ref >90)
Glucose, Bld: 128 mg/dL — ABNORMAL HIGH (ref 70–99)
POTASSIUM: 4.6 meq/L (ref 3.7–5.3)
Sodium: 139 mEq/L (ref 137–147)
Total Bilirubin: 0.2 mg/dL — ABNORMAL LOW (ref 0.3–1.2)
Total Protein: 7 g/dL (ref 6.0–8.3)

## 2014-02-22 LAB — HEMOGLOBIN A1C
Hgb A1c MFr Bld: 6.1 % — ABNORMAL HIGH (ref <5.7)
Mean Plasma Glucose: 128 mg/dL — ABNORMAL HIGH (ref <117)

## 2014-02-22 MED ORDER — OXYCODONE-ACETAMINOPHEN 5-325 MG PO TABS
1.0000 | ORAL_TABLET | ORAL | Status: DC | PRN
  Administered 2014-02-22 – 2014-02-23 (×5): 2 via ORAL
  Filled 2014-02-22 (×5): qty 2

## 2014-02-22 MED ORDER — ALUM & MAG HYDROXIDE-SIMETH 200-200-20 MG/5ML PO SUSP
30.0000 mL | Freq: Once | ORAL | Status: AC
  Administered 2014-02-22: 30 mL via ORAL
  Filled 2014-02-22: qty 30

## 2014-02-22 NOTE — Progress Notes (Signed)
Cardiology Consult Note  Admit date: 02/21/2014 Name: Dennis Brock 58 y.o.  male DOB:  08-25-1956 MRN:  [id number]  Today's date:  02/22/2014  Referring Physician:    Teaching service Cardiology : Graciela HusbandsKlein  Primary Physician:  Dr. Felecia ShellingEd Avbuere  Reason for Consultation:   Chest pain   HISTORY:  This 58 year old male recently [place] here [place] He has a history of hypertensive heart disease, hyperlipidemia and obesity. He has a prior history of stroke that occurred in 2013 while he was here. He evidently had a CT angiogram of his head and neck that showed a possible developing aneurysm blood no definite aneurysm was noted. He has a history of a heart attack in the past and was told he had a problem on the bottom part of his heart and mentioned a catheterization. 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 He recently established with Dr. Graciela HusbandsKlein and was seen by him about a month ago and felt to have PAF and was placed on Eliquus and taken off of Plavix. He presented to the emergency room with a 3 week history of sharp chest pain. He describes the pain is sharp midsternal as if someone is stabbing him with a knife and he would be intermittent and would occur on off the past 3 weeks. It became worse today and he went to urgent care and the history was obtained there that it went into his arm. ? Relief with NTG. He also noted that he would have pain radiating through to his mid back and also pain if he bent his neck backwards. He stated that it hurts take a deep breath and it was not positional in nature. He did not describe the pain as pressure-like or heaviness. He had moderate shortness of breath with the pain. In the emergency room he had a CT angiogram that showed evidence of pulmonary embolus and he was noted to have no significant coronary calcification. He normally has no PND orthopnea. He has had a mild amount of edema recently. He has complained of nocturia as well as urinary  frequency.  Past Medical History  Diagnosis Date  . Hypertension     amlodipine, lisinopril  . Stroke     was on aspirin before stroke. July 2012 in [place].  sent home aspirin.   Marland Kitchen. Hyperlipidemia     zocor 40mg   . Sick sinus syndrome     s/p pacemaker, no defibrillator  . GERD (gastroesophageal reflux disease)   . Chronic pain syndrome   . Seasonal allergies   . Asthma   . Chronic kidney disease   . GERD (gastroesophageal reflux disease)   . Arthritis   . COPD (chronic obstructive pulmonary disease)   . Lumbar disc disease       Past Surgical History  Procedure Laterality Date  . Pacemaker insertion    . Lumbar laminectomy      5 back surgeries  . Hand surgery      3 broken bones  . Bunionectomy      right foot     Allergies:  is allergic to dilaudid.   Medications: Prior to Admission medications   Medication Sig Start Date End Date Taking? Authorizing Provider  albuterol (PROVENTIL HFA;VENTOLIN HFA) 108 (90 BASE) MCG/ACT inhaler Inhale 2 puffs into the lungs every 6 (six) hours as needed for wheezing or shortness of breath.    Yes Historical Provider, MD  amLODipine (NORVASC) 5 MG tablet Take 1 tablet (5 mg  total) by mouth daily. 01/15/14  Yes Duke SalviaSteven C Klein, MD  apixaban (ELIQUIS) 5 MG TABS tablet Take 1 tablet (5 mg total) by mouth 2 (two) times daily. 01/15/14  Yes Duke SalviaSteven C Klein, MD  atorvastatin (LIPITOR) 20 MG tablet Take 20 mg by mouth daily.   Yes Historical Provider, MD  benzonatate (TESSALON) 100 MG capsule Take 100 mg by mouth every 8 (eight) hours as needed for cough.   Yes Historical Provider, MD  budesonide-formoterol (SYMBICORT) 160-4.5 MCG/ACT inhaler Inhale 2 puffs into the lungs daily.   Yes Historical Provider, MD  gabapentin (NEURONTIN) 100 MG capsule Take 100 mg by mouth 3 (three) times daily.   Yes Historical Provider, MD  lisinopril (PRINIVIL,ZESTRIL) 10 MG tablet Take 10 mg by mouth daily.   Yes Historical Provider, MD  omeprazole (PRILOSEC) 20 MG  capsule Take 1 capsule (20 mg total) by mouth daily. 06/02/12  Yes Bryan R Hess, DO  polyethylene glycol (MIRALAX / GLYCOLAX) packet Take 17 g by mouth daily.   Yes Historical Provider, MD    Family History: Family Status  Relation Status Death Age  . Father Deceased     throat cancer  . Mother Alive     CHF  . Sister Deceased     died in a fire  . Sister Alive   . Sister Alive   . Sister Alive   . Sister Alive   . Sister Alive     Social History:   reports that he quit smoking about 3 months ago. He does not have any smokeless tobacco history on file. He reports that he does not drink alcohol or use illicit drugs.   History   Social History Narrative   Recently moved from [place] 2 months ago. Has not established with primary care doctor.    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    Walk every other day 1 mile.       Currently living with sister but looking for another residence.    On disability. Medicare.     Review of Systems: Has been obese for many years. Complains of chronic pain involving his back and has had multiple lumbar laminectomies as well as some infection there. He has a lot of difficulty with shortness of breath and has been using different types of nebulizers. He complains of nocturia and frequency. He has been able to walk some without angina. He has some mild constipation. 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 He has significant pain involving his knees. He has some mild numbness of his feet. Other than as noted above the remainder of the review of systems is unremarkable.  Physical Exam: BP 133/81  Pulse 72  Temp(Src) 97.9 F (36.6 C) (Oral)  Resp 18  Ht 6\' 1"  (1.854 m)  Wt 307 lb 8 oz (139.481 kg)  BMI 40.58 kg/m2  SpO2 94%  General appearance: Large obese black male complaining of chest pain with inspiration but appears in no acute  distress Head: Normocephalic, without obvious abnormality, atraumatic Eyes: conjunctivae/corneas clear. PERRL, EOM's intact. Fundi benign. Neck: no adenopathy, no carotid bruit, no JVD and supple, symmetrical, trachea midline Lungs: clear to auscultation bilaterally Heart: regular rate and rhythm, S1, S2 normal, no murmur, click, rub or gallop Abdomen: soft, non-tender; bowel sounds normal; no masses,  no organomegaly Rectal: deferred Extremities: 1+ edema Pulses:  2+ and symmetric Skin: Skin color, texture, turgor normal. No rashes or lesions Neurologic: Grossly normal  Labs: CBC  Recent Labs  02/21/14 2052  WBC 4.6  RBC 4.79  HGB 14.3  HCT 42.3  PLT 171  MCV 88.3  MCH 29.9  MCHC 33.8  RDW 14.1  LYMPHSABS 2.2  MONOABS 0.3  EOSABS 0.1  BASOSABS 0.0   CMP   Recent Labs  02/22/14 0242  NA 139  K 4.6  CL 101  CO2 27  GLUCOSE 128*  BUN 13  CREATININE 1.33  CALCIUM 9.3  PROT 7.0  ALBUMIN 3.9  AST 15  ALT 18  ALKPHOS 125*  BILITOT 0.2*  GFRNONAA 58*  GFRAA 67*   BNP (last 3 results)  Recent Labs  01/09/14 1047 02/21/14 1359  PROBNP 91.7 135.2*   Cardiac Panel (last 3 results) Troponin (Point of Care Test)  Recent Labs  02/21/14 1454  TROPIPOC 0.00     Radiology: Cardiomegaly, 2-lead pacemaker seen, questionable vascular congestion  EKG: Sinus rhythm with PACs, no ischemic ST changes, small inferior Q waves   IMPRESSIONS: 1. Chest pain with atypical features-the pain is been present for 3 weeks ( fairly constant) and is pleuritic in nature and non-positional.   Troponin levels are negative.  He walks with his sister - several miles a couple of times a week and this has not worsened the angina.  He had a negative stress myoview in [place] last year.  ( for the same CP)    2. History of sick sinus syndrome with indwelling pacemaker  3. Hypertensive heart disease 4. Hyperlipidemia 5. Morbid obesity 6. 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 Significant lumbar disc disease 8. Prior history of stroke with possible aneurysm that may be developing 9. History of COPD and tobacco abuse-currently not smoking 10. Recent addition of Eliquus to regimen 11. PAF  RECOMMENDATION:  This is a very low risk patient.  I do not think this represents coronary ischemia.  The CP seems more musculoskeletal than ACS.  The pain is not worsened with walking.   Will sign off.   He can follow up in the office with Dr. Graciela Husbands.   Vesta Mixer, Montez Hageman., MD, [facility] 02/22/2014, 10:28 AM [address]  [address] Office [phone] Pager [phone]

## 2014-02-22 NOTE — Progress Notes (Signed)
  Echocardiogram 2D Echocardiogram has been performed.  Cathie BeamsGREGORY, ANGELA 02/22/2014, 3:11 PM

## 2014-02-22 NOTE — Progress Notes (Signed)
[facility] PROGRESS NOTE  Dennis MageJerry Brock [facility]ic[facility] LLCChavis ZOX:[id number] DOB: [birth year] DOA: 02/21/2014 PCP: Dennis Brock,Dennis Brock, Dennis Brock  Assessment/Plan: Chest pain, atypical: Dennis Brock-Craig enzymes negative, per cardiology noncardiac -CT scan showed negative for PE -Bronchitis/COPD likely contributing factor -Await 2-D echo Transient hypoxia/COPD  -Continue nebs, antibiotics and Solu-Medrol -As above CT angiogram negative for PE  Hypertension/Brock. fib:  -Hemodynamically stable. Continue home regimen. Continue eliquis.    Code Status: Full Family Communication: None at bedside Disposition Plan: To home when medically ready   Consultants:  Cardiology  Procedures:  Echocardiogram-pending  Antibiotics:  Doxycycline  HPI/Subjective: States he still has chest pain worse with inspiration.  Objective: Filed Vitals:   02/22/14 1340  BP: 133/94  Pulse: 72  Temp: 97.4 F (36.3 Brock)  Resp: 20    Intake/Output Summary (Last 24 hours) at 02/22/14 1521 Last data filed at 02/22/14 1300  Gross per 24 hour  Intake    360 ml  Output   1000 ml  Net   -640 ml   Filed Weights   02/21/14 1331 02/21/14 2122  Weight: 140.615 kg (310 lb) 139.481 kg (307 lb 8 oz)    Exam:  General: alert & oriented x 3 In NAD Cardiovascular: RRR, nl S1 s2 Respiratory: Scattered rhonchi, no wheezes and no crackles Abdomen: soft +BS NT/ND, no masses palpable Extremities: No cyanosis and no edema    Data Reviewed: Basic Metabolic Panel:  Recent Labs Lab 02/21/14 1358 02/21/14 2052 02/22/14 0242  NA 146  --  139  K 3.9  --  4.6  CL 107  --  101  CO2 28  --  27  GLUCOSE 96  --  128*  BUN 11  --  13  CREATININE 1.26 1.25 1.33  CALCIUM 9.2  --  9.3   Liver Function Tests:  Recent Labs Lab 02/22/14 0242  AST 15  ALT 18  ALKPHOS 125*  BILITOT 0.2*  PROT 7.0  ALBUMIN 3.9   No results found for this basename: LIPASE, AMYLASE,  in the last 168 hours No results found for this basename: AMMONIA,  in  the last 168 hours CBC:  Recent Labs Lab 02/21/14 1358 02/21/14 2052  WBC 4.2 4.6  NEUTROABS  --  1.9  HGB 13.4 14.3  HCT 40.2 42.3  MCV 87.8 88.3  PLT 177 171   Cardiac Enzymes:  Recent Labs Lab 02/21/14 2052 02/22/14 0243 02/22/14 0843  TROPONINI <0.30 <0.30 <0.30   BNP (last 3 results)  Recent Labs  01/09/14 1047 02/21/14 1359  PROBNP 91.7 135.2*   CBG: No results found for this basename: GLUCAP,  in the last 168 hours  No results found for this or any previous visit (from the past 240 hour(s)).   Studies: Dg Chest 2 View  02/21/2014   CLINICAL DATA:  Chest pain. Short of breath. Asthma. COPD. Pacemaker.  EXAM: CHEST  2 VIEW  COMPARISON:  01/09/2014.  FINDINGS: Cardiomegaly. Stable appearance of the cardiopericardial silhouette and mediastinal contours. Dual lead LEFT subclavian cardiac pacemaker. No airspace disease or pleural effusion. Pulmonary vascular congestion is present with increased cephalization of pulmonary blood flow compared to prior exam suggesting volume overload.  IMPRESSION: Cardiomegaly and pulmonary vascular congestion.   Electronically Signed   By: Andreas NewportGeoffrey  Lamke M.D.   On: 02/21/2014 15:32   Ct Angio Chest Pe W/cm &/or Wo Cm  02/21/2014   CLINICAL DATA:  Chest pain radiating to back.  EXAM: CT ANGIOGRAPHY CHEST WITH CONTRAST  TECHNIQUE: Multidetector CT imaging  of the chest was performed using the standard protocol during bolus administration of intravenous contrast. Multiplanar CT image reconstructions and MIPs were obtained to evaluate the vascular anatomy.  CONTRAST:  100mL OMNIPAQUE IOHEXOL 350 MG/ML SOLN  COMPARISON:  Chest radiograph February 19, 2014  FINDINGS: Adequate contrast opacification of the pulmonary artery's. Main pulmonary artery is not enlarged. No pulmonary arterial filling defects to the level of the subsegmental branches.  Mild cardiomegaly, no right heart strain. Left cardiac pacemaker with leads in right atrium and right ventricle  appear Pericardium is unremarkable. Thoracic aorta is normal course and caliber, unremarkable. No lymphadenopathy by CT size criteria. Tracheobronchial tree is patent, no pneumothorax. Pulmonary venous congestion. Heterogeneous lung attenuation without pleural effusions or focal consolidations.  Included view of the abdomen is unremarkable. Visualized soft tissues and included osseous structures appear normal.  Review of the MIP images confirms the above findings.  IMPRESSION: No acute pulmonary embolism.  Mild cardiomegaly, pulmonary venous congestion with heterogeneous lung attenuation suggesting pulmonary edema/small airway disease.   Electronically Signed   By: Awilda Metroourtnay  Bloomer   On: 02/21/2014 18:29    Scheduled Meds: . amLODipine  5 mg Oral Daily  . apixaban  5 mg Oral BID  . aspirin EC  325 mg Oral Daily  . atorvastatin  20 mg Oral Daily  . budesonide-formoterol  2 puff Inhalation Daily  . doxycycline (VIBRAMYCIN) IV  100 mg Intravenous Q12H  . gabapentin  100 mg Oral TID  . lisinopril  10 mg Oral Daily  . methylPREDNISolone sodium succinate  125 mg Intravenous Q12H  . pantoprazole  40 mg Oral Daily  . polyethylene glycol  17 g Oral Daily  . sodium chloride  3 mL Intravenous Q12H   Continuous Infusions: . sodium chloride 60 mL/hr at 02/21/14 2243    Active Problems:   Chest pain   Cardiac pacemaker in situ   Morbid obesity   Long-term (current) use of anticoagulants   Paroxysmal atrial fibrillation    Time spent: 25    [facility] HospitalVIYUOH,Dennis Brock  [facility] Pager [phone]. If 7PM-7AM, please contact night-coverage at [email or web address], password [email or web address] 02/22/2014, 3:21 PM  LOS: 1 day

## 2014-02-22 NOTE — Progress Notes (Signed)
Utilization review completed.  

## 2014-02-23 DIAGNOSIS — R0789 Other chest pain: Secondary | ICD-10-CM | POA: Diagnosis not present

## 2014-02-23 DIAGNOSIS — I4891 Unspecified atrial fibrillation: Secondary | ICD-10-CM

## 2014-02-23 DIAGNOSIS — K219 Gastro-esophageal reflux disease without esophagitis: Secondary | ICD-10-CM

## 2014-02-23 DIAGNOSIS — J449 Chronic obstructive pulmonary disease, unspecified: Secondary | ICD-10-CM

## 2014-02-23 DIAGNOSIS — R079 Chest pain, unspecified: Secondary | ICD-10-CM

## 2014-02-23 LAB — COMPREHENSIVE METABOLIC PANEL
ALBUMIN: 3.8 g/dL (ref 3.5–5.2)
ALT: 16 U/L (ref 0–53)
AST: 12 U/L (ref 0–37)
Alkaline Phosphatase: 122 U/L — ABNORMAL HIGH (ref 39–117)
BUN: 15 mg/dL (ref 6–23)
CALCIUM: 9.5 mg/dL (ref 8.4–10.5)
CO2: 26 meq/L (ref 19–32)
CREATININE: 1.16 mg/dL (ref 0.50–1.35)
Chloride: 99 mEq/L (ref 96–112)
GFR calc Af Amer: 79 mL/min — ABNORMAL LOW (ref >90)
GFR, EST NON AFRICAN AMERICAN: 68 mL/min — AB (ref >90)
Glucose, Bld: 209 mg/dL — ABNORMAL HIGH (ref 70–99)
Potassium: 4.7 mEq/L (ref 3.7–5.3)
Sodium: 138 mEq/L (ref 137–147)
Total Bilirubin: <0.2 mg/dL — ABNORMAL LOW (ref 0.3–1.2)
Total Protein: 7.1 g/dL (ref 6.0–8.3)

## 2014-02-23 MED ORDER — PREDNISONE 20 MG PO TABS: 40.0000 mg | ORAL_TABLET | Freq: Every day | ORAL | Status: DC

## 2014-02-23 MED ORDER — OXYCODONE-ACETAMINOPHEN 5-325 MG PO TABS: 1.0000 | ORAL_TABLET | ORAL | Status: DC | PRN

## 2014-02-23 MED ORDER — OMEPRAZOLE 40 MG PO CPDR: 40.0000 mg | DELAYED_RELEASE_CAPSULE | Freq: Two times a day (BID) | ORAL | Status: DC

## 2014-02-23 MED ORDER — DOXYCYCLINE HYCLATE 100 MG PO CAPS: 100.0000 mg | ORAL_CAPSULE | Freq: Two times a day (BID) | ORAL | Status: DC

## 2014-02-23 NOTE — Discharge Summary (Signed)
Physician Discharge Summary  Dennis Brock Sava ZOX:[id number] DOB: 03-10-1956 DOA: 02/21/2014  PCP: Dennis German, MD  Admit date: 02/21/2014 Discharge date: 02/23/2014  Time spent: <30 minutes  Recommendations for Outpatient Follow-up:  Follow-up Information   Follow up with Dennis German, MD. (in 1-2weeks, call for appt upo discharge)    Specialty:  Internal Medicine   Contact information:   Dennis Brock [place] [address] [phone]       Discharge Diagnoses:  Active Problems:   Chest pain   Cardiac pacemaker in situ   Morbid obesity   Long-term (current) use of anticoagulants   Paroxysmal atrial fibrillation   Discharge Condition: Improved  /Stable   Diet recommendation: low sodium heart healthy  Filed Weights   02/21/14 1331 02/21/14 2122 02/23/14 0500  Weight: 140.615 kg (310 lb) 139.481 kg (307 lb 8 oz) 142.429 kg (314 lb)    History of present illness:  Patient is a 58 y.o. year old male with noted prior history of CVA, hypertension, COPD, atrial fibrillation on glucose, sick sinus syndrome status post pacemaker presenting with chest pain. Patient states he's had intermittent chest pain over the past 3 weeks acutely became worse and change probably she earlier today. Prior to today patient had generalized chest pain that was worse with cough deep breathing as well as some productive sputum. Patient says he woke up today with severe crushing chest pain that radiated to the back was associated bilateral arm over further right tingling. Mild associated nause symptoms.  Presents to the ER with symptoms. Afebrile. Hemodynamically stable. O2 sats in the upper 80s to low 90s on room air. Blood work within normal limits. Had a CT scan that was negative for pulmonary embolus though did show some mild cardiomegaly and pulmonary vascular congestion. ProBNP 135. Trop neg x1. EKG sinus rhythm with mild 1 waves in lateral leads. Heart score 5. Was given nitroglycerin  strength x2 in the ER with marked improvement of chest pain. He was admitted for further evaluation and management.   Hospital Course:  Chest pain, atypical:  -As discussed above upon admission cardiac enzymes were cycled and an echocardiogram was ordered. Cardiology was consulted and no further workup recommended -cardiac enzymes negative as mentioned, per cardiology noncardiac  -CT scan showed negative for PE  -Bronchitis/COPD vs. GERD contributing factors in -2-D echo with EF 60-65% and no regional wall abnormalities COPD with Transient hypoxia -Patient was treated nebs, antibiotics and Solu-Medrol  -As above CT angiogram negative for PE  -he is clinically improved, he'll be discharged on a prednisone taper on oral antibiotics>> follow up with PCP Hypertension/A. fib:  -Remained stable. Continue home regimen. Continue eliquis.    Procedures:  2-D echo   Study Conclusions  - Left ventricle: The cavity size was normal. Systolic function was normal. The estimated ejection fraction was in the range of 60% to 65%. Wall motion was normal; there were no regional wall motion abnormalities. Doppler parameters are consistent with abnormal left ventricular relaxation (grade 1 diastolic dysfunction). - Aortic valve: There was no stenosis. - Aorta: Mildly dilated aortic root. Aortic root dimension: 39 mm (ED). - Mitral valve: Mildly calcified annulus. Mildly calcified leaflets . There was trivial regurgitation. - Left atrium: The atrium was mildly dilated. - Right ventricle: The cavity size was normal. Pacer wire or catheter noted in right ventricle. Systolic function was normal. - Pulmonary arteries: No complete TR doppler jet so unable to estimate PA systolic pressure. - Inferior vena cava: The vessel  was normal in size. The respirophasic diameter changes were in the normal range (>= 50%), consistent with normal central venous pressure.  Impressions:  - Normal LV size and  systolic function, EF 60-65%. Normal RV size and systolic function. No significant valvular abnormalities.   Consultations:   cardiology  Discharge Exam: Filed Vitals:   02/23/14 0917  BP: 129/91  Pulse:   Temp:   Resp:    Exam:  General: alert & oriented x 3 In NAD  Cardiovascular: RRR, nl S1 s2  Respiratory: Decreased breath sounds at the bases, no wheezes and no crackles  Abdomen: soft +BS NT/ND, no masses palpable  Extremities: No cyanosis and no edema  Discharge Instructions You were cared for by a hospitalist during your hospital stay. If you have any questions about your discharge medications or the care you received while you were in the hospital after you are discharged, you can call the unit and asked to speak with the hospitalist on call if the hospitalist that took care of you is not available. Once you are discharged, your primary care physician will handle any further medical issues. Please note that NO REFILLS for any discharge medications will be authorized once you are discharged, as it is imperative that you return to your primary care physician (or establish a relationship with a primary care physician if you do not have one) for your aftercare needs so that they can reassess your need for medications and monitor your lab values.  Discharge Instructions   Diet - low sodium heart healthy    Complete by:  As directed      Increase activity slowly    Complete by:  As directed             Medication List         albuterol 108 (90 BASE) MCG/ACT inhaler  Commonly known as:  PROVENTIL HFA;VENTOLIN HFA  Inhale 2 puffs into the lungs every 6 (six) hours as needed for wheezing or shortness of breath.     amLODipine 5 MG tablet  Commonly known as:  NORVASC  Take 1 tablet (5 mg total) by mouth daily.     apixaban 5 MG Tabs tablet  Commonly known as:  ELIQUIS  Take 1 tablet (5 mg total) by mouth 2 (two) times daily.     atorvastatin 20 MG tablet  Commonly  known as:  LIPITOR  Take 20 mg by mouth daily.     benzonatate 100 MG capsule  Commonly known as:  TESSALON  Take 100 mg by mouth every 8 (eight) hours as needed for cough.     budesonide-formoterol 160-4.5 MCG/ACT inhaler  Commonly known as:  SYMBICORT  Inhale 2 puffs into the lungs daily.     gabapentin 100 MG capsule  Commonly known as:  NEURONTIN  Take 100 mg by mouth 3 (three) times daily.     lisinopril 10 MG tablet  Commonly known as:  PRINIVIL,ZESTRIL  Take 10 mg by mouth daily.     omeprazole 40 MG capsule  Commonly known as:  PRILOSEC  Take 1 capsule (40 mg total) by mouth 2 (two) times daily before a meal.     oxyCODONE-acetaminophen 5-325 MG per tablet  Commonly known as:  PERCOCET/ROXICET  Take 1-2 tablets by mouth every 4 (four) hours as needed for severe pain.     polyethylene glycol packet  Commonly known as:  MIRALAX / GLYCOLAX  Take 17 g by mouth daily.  predniSONE 20 MG tablet  Commonly known as:  DELTASONE  Take 2 tablets (40 mg total) by mouth daily with breakfast.      Doxycycline 100 mg by mouth twice a day  Allergies  Allergen Reactions  . Dilaudid [Hydromorphone] Itching      The results of significant diagnostics from this hospitalization (including imaging, microbiology, ancillary and laboratory) are listed below for reference.    Significant Diagnostic Studies: Dg Chest 2 View  02/21/2014   CLINICAL DATA:  Chest pain. Short of breath. Asthma. COPD. Pacemaker.  EXAM: CHEST  2 VIEW  COMPARISON:  01/09/2014.  FINDINGS: Cardiomegaly. Stable appearance of the cardiopericardial silhouette and mediastinal contours. Dual lead LEFT subclavian cardiac pacemaker. No airspace disease or pleural effusion. Pulmonary vascular congestion is present with increased cephalization of pulmonary blood flow compared to prior exam suggesting volume overload.  IMPRESSION: Cardiomegaly and pulmonary vascular congestion.   Electronically Signed   By: Andreas NewportGeoffrey   Lamke M.D.   On: 02/21/2014 15:32   Ct Angio Chest Pe W/cm &/or Wo Cm  02/21/2014   CLINICAL DATA:  Chest pain radiating to back.  EXAM: CT ANGIOGRAPHY CHEST WITH CONTRAST  TECHNIQUE: Multidetector CT imaging of the chest was performed using the standard protocol during bolus administration of intravenous contrast. Multiplanar CT image reconstructions and MIPs were obtained to evaluate the vascular anatomy.  CONTRAST:  100mL OMNIPAQUE IOHEXOL 350 MG/ML SOLN  COMPARISON:  Chest radiograph February 19, 2014  FINDINGS: Adequate contrast opacification of the pulmonary artery's. Main pulmonary artery is not enlarged. No pulmonary arterial filling defects to the level of the subsegmental branches.  Mild cardiomegaly, no right heart strain. Left cardiac pacemaker with leads in right atrium and right ventricle appear Pericardium is unremarkable. Thoracic aorta is normal course and caliber, unremarkable. No lymphadenopathy by CT size criteria. Tracheobronchial tree is patent, no pneumothorax. Pulmonary venous congestion. Heterogeneous lung attenuation without pleural effusions or focal consolidations.  Included view of the abdomen is unremarkable. Visualized soft tissues and included osseous structures appear normal.  Review of the MIP images confirms the above findings.  IMPRESSION: No acute pulmonary embolism.  Mild cardiomegaly, pulmonary venous congestion with heterogeneous lung attenuation suggesting pulmonary edema/small airway disease.   Electronically Signed   By: Awilda Metroourtnay  Bloomer   On: 02/21/2014 18:29    Microbiology: No results found for this or any previous visit (from the past 240 hour(s)).   Labs: Basic Metabolic Panel:  Recent Labs Lab 02/21/14 1358 02/21/14 2052 02/22/14 0242 02/23/14 0430  NA 146  --  139 138  K 3.9  --  4.6 4.7  CL 107  --  101 99  CO2 28  --  27 26  GLUCOSE 96  --  128* 209*  BUN 11  --  13 15  CREATININE 1.26 1.25 1.33 1.16  CALCIUM 9.2  --  9.3 9.5   Liver  Function Tests:  Recent Labs Lab 02/22/14 0242 02/23/14 0430  AST 15 12  ALT 18 16  ALKPHOS 125* 122*  BILITOT 0.2* <0.2*  PROT 7.0 7.1  ALBUMIN 3.9 3.8   No results found for this basename: LIPASE, AMYLASE,  in the last 168 hours No results found for this basename: AMMONIA,  in the last 168 hours CBC:  Recent Labs Lab 02/21/14 1358 02/21/14 2052  WBC 4.2 4.6  NEUTROABS  --  1.9  HGB 13.4 14.3  HCT 40.2 42.3  MCV 87.8 88.3  PLT 177 171   Cardiac Enzymes:  Recent  Labs Lab 02/21/14 2052 02/22/14 0243 02/22/14 0843  TROPONINI <0.30 <0.30 <0.30   BNP: BNP (last 3 results)  Recent Labs  01/09/14 1047 02/21/14 1359  PROBNP 91.7 135.2*   CBG: No results found for this basename: GLUCAP,  in the last 168 hours     Signed:  VIYUOH,ADELINE C  [facility] 02/23/2014, 12:05 PM

## 2014-02-23 NOTE — Plan of Care (Signed)
Problem: Discharge Progression Outcomes Goal: No anginal pain Outcome: Adequate for Discharge Pt continues to pain with inspiration but has improved significantly will be discharged on meds at home to continue treatment of COPD exacerbation

## 2014-02-26 NOTE — ED Provider Notes (Signed)
Medical screening examination/treatment/procedure(s) were performed by non-physician practitioner and as supervising physician I was immediately available for consultation/collaboration.   EKG Interpretation   Date/Time:  Thursday February 21 2014 13:36:39 EDT Ventricular Rate:  70 PR Interval:  169 QRS Duration: 106 QT Interval:  406 QTC Calculation: 438 R Axis:   100 Text Interpretation:  Age not entered, assumed to be  58 years old for  purpose of ECG interpretation Right and left arm electrode reversal,  interpretation assumes no reversal Sinus rhythm Right ventricular  hypertrophy Nonspecific T abnormalities, lateral leads ED PHYSICIAN  INTERPRETATION AVAILABLE IN [facility] Confirmed by TEST, Record  ([id number]) on 02/23/2014 8:45:30 AM        Gwyneth SproutWhitney Plunkett, MD 02/26/14 [phone]

## 2014-03-06 ENCOUNTER — Encounter (HOSPITAL_COMMUNITY): Payer: Self-pay | Admitting: Emergency Medicine

## 2014-03-06 ENCOUNTER — Emergency Department (HOSPITAL_COMMUNITY)
Admission: EM | Admit: 2014-03-06 | Discharge: 2014-03-06 | Disposition: A | Discharge status: Home / Self Care | Payer: Medicare Other | Attending: Emergency Medicine | Admitting: Emergency Medicine

## 2014-03-06 ENCOUNTER — Emergency Department (HOSPITAL_COMMUNITY): Payer: Medicare Other

## 2014-03-06 DIAGNOSIS — IMO0002 Reserved for concepts with insufficient information to code with codable children: Secondary | ICD-10-CM | POA: Diagnosis not present

## 2014-03-06 DIAGNOSIS — I129 Hypertensive chronic kidney disease with stage 1 through stage 4 chronic kidney disease, or unspecified chronic kidney disease: Secondary | ICD-10-CM | POA: Diagnosis not present

## 2014-03-06 DIAGNOSIS — M129 Arthropathy, unspecified: Secondary | ICD-10-CM | POA: Insufficient documentation

## 2014-03-06 DIAGNOSIS — J4489 Other specified chronic obstructive pulmonary disease: Secondary | ICD-10-CM | POA: Insufficient documentation

## 2014-03-06 DIAGNOSIS — Z79899 Other long term (current) drug therapy: Secondary | ICD-10-CM | POA: Insufficient documentation

## 2014-03-06 DIAGNOSIS — E785 Hyperlipidemia, unspecified: Secondary | ICD-10-CM | POA: Diagnosis not present

## 2014-03-06 DIAGNOSIS — G8929 Other chronic pain: Secondary | ICD-10-CM | POA: Diagnosis not present

## 2014-03-06 DIAGNOSIS — Z8673 Personal history of transient ischemic attack (TIA), and cerebral infarction without residual deficits: Secondary | ICD-10-CM | POA: Insufficient documentation

## 2014-03-06 DIAGNOSIS — R609 Edema, unspecified: Secondary | ICD-10-CM | POA: Insufficient documentation

## 2014-03-06 DIAGNOSIS — Z950 Presence of cardiac pacemaker: Secondary | ICD-10-CM | POA: Insufficient documentation

## 2014-03-06 DIAGNOSIS — Z7902 Long term (current) use of antithrombotics/antiplatelets: Secondary | ICD-10-CM | POA: Insufficient documentation

## 2014-03-06 DIAGNOSIS — J449 Chronic obstructive pulmonary disease, unspecified: Secondary | ICD-10-CM | POA: Diagnosis not present

## 2014-03-06 DIAGNOSIS — N189 Chronic kidney disease, unspecified: Secondary | ICD-10-CM | POA: Diagnosis not present

## 2014-03-06 DIAGNOSIS — Z792 Long term (current) use of antibiotics: Secondary | ICD-10-CM | POA: Insufficient documentation

## 2014-03-06 DIAGNOSIS — Z87891 Personal history of nicotine dependence: Secondary | ICD-10-CM | POA: Diagnosis not present

## 2014-03-06 DIAGNOSIS — K219 Gastro-esophageal reflux disease without esophagitis: Secondary | ICD-10-CM | POA: Diagnosis not present

## 2014-03-06 DIAGNOSIS — M7989 Other specified soft tissue disorders: Secondary | ICD-10-CM | POA: Diagnosis present

## 2014-03-06 LAB — URINALYSIS, ROUTINE W REFLEX MICROSCOPIC
Bilirubin Urine: NEGATIVE
GLUCOSE, UA: NEGATIVE mg/dL
Hgb urine dipstick: NEGATIVE
Ketones, ur: NEGATIVE mg/dL
LEUKOCYTES UA: NEGATIVE
Nitrite: NEGATIVE
PROTEIN: NEGATIVE mg/dL
Specific Gravity, Urine: 1.023 (ref 1.005–1.030)
UROBILINOGEN UA: 1 mg/dL (ref 0.0–1.0)
pH: 6 (ref 5.0–8.0)

## 2014-03-06 LAB — CBC
HCT: 40.5 % (ref 39.0–52.0)
HEMOGLOBIN: 13.2 g/dL (ref 13.0–17.0)
MCH: 29.7 pg (ref 26.0–34.0)
MCHC: 32.6 g/dL (ref 30.0–36.0)
MCV: 91.2 fL (ref 78.0–100.0)
Platelets: 160 10*3/uL (ref 150–400)
RBC: 4.44 MIL/uL (ref 4.22–5.81)
RDW: 15.3 % (ref 11.5–15.5)
WBC: 5.7 10*3/uL (ref 4.0–10.5)

## 2014-03-06 LAB — I-STAT TROPONIN, ED: TROPONIN I, POC: 0 ng/mL (ref 0.00–0.08)

## 2014-03-06 LAB — BASIC METABOLIC PANEL
Anion gap: 12 (ref 5–15)
BUN: 14 mg/dL (ref 6–23)
CALCIUM: 8.9 mg/dL (ref 8.4–10.5)
CO2: 25 mEq/L (ref 19–32)
Chloride: 104 mEq/L (ref 96–112)
Creatinine, Ser: 1.3 mg/dL (ref 0.50–1.35)
GFR calc Af Amer: 69 mL/min — ABNORMAL LOW (ref >90)
GFR calc non Af Amer: 59 mL/min — ABNORMAL LOW (ref >90)
GLUCOSE: 143 mg/dL — AB (ref 70–99)
Potassium: 4 mEq/L (ref 3.7–5.3)
SODIUM: 141 meq/L (ref 137–147)

## 2014-03-06 LAB — PRO B NATRIURETIC PEPTIDE: PRO B NATRI PEPTIDE: 148.5 pg/mL — AB (ref 0–125)

## 2014-03-06 MED ORDER — PREDNISONE 10 MG PO TABS: 60.0000 mg | ORAL_TABLET | Freq: Every day | ORAL | Status: DC

## 2014-03-06 MED ORDER — PREDNISONE 20 MG PO TABS
60.0000 mg | ORAL_TABLET | Freq: Once | ORAL | Status: AC
  Administered 2014-03-06: 60 mg via ORAL
  Filled 2014-03-06: qty 3

## 2014-03-06 MED ORDER — IPRATROPIUM BROMIDE 0.02 % IN SOLN
0.5000 mg | Freq: Once | RESPIRATORY_TRACT | Status: AC
  Administered 2014-03-06: 0.5 mg via RESPIRATORY_TRACT
  Filled 2014-03-06: qty 2.5

## 2014-03-06 MED ORDER — FUROSEMIDE 20 MG PO TABS
60.0000 mg | ORAL_TABLET | Freq: Once | ORAL | Status: AC
  Administered 2014-03-06: 60 mg via ORAL
  Filled 2014-03-06: qty 3

## 2014-03-06 MED ORDER — ALBUTEROL SULFATE HFA 108 (90 BASE) MCG/ACT IN AERS: 2.0000 | INHALATION_SPRAY | RESPIRATORY_TRACT | Status: DC | PRN

## 2014-03-06 MED ORDER — ALBUTEROL SULFATE (2.5 MG/3ML) 0.083% IN NEBU
5.0000 mg | INHALATION_SOLUTION | Freq: Once | RESPIRATORY_TRACT | Status: AC
  Administered 2014-03-06: 5 mg via RESPIRATORY_TRACT
  Filled 2014-03-06: qty 6

## 2014-03-06 MED ORDER — OXYCODONE-ACETAMINOPHEN 5-325 MG PO TABS
1.0000 | ORAL_TABLET | Freq: Once | ORAL | Status: AC
  Administered 2014-03-06: 1 via ORAL
  Filled 2014-03-06: qty 1

## 2014-03-06 NOTE — ED Provider Notes (Signed)
Medical screening examination/treatment/procedure(s) were performed by non-physician practitioner and as supervising physician I was immediately available for consultation/collaboration.   EKG Interpretation   Date/Time:  Thursday February 21 2014 13:36:39 EDT Ventricular Rate:  70 PR Interval:  169 QRS Duration: 106 QT Interval:  406 QTC Calculation: 438 R Axis:   100 Text Interpretation:  Age not entered, assumed to be  58 years old for  purpose of ECG interpretation Right and left arm electrode reversal,  interpretation assumes no reversal Sinus rhythm Right ventricular  hypertrophy Nonspecific T abnormalities, lateral leads ED PHYSICIAN  INTERPRETATION AVAILABLE IN [facility] Confirmed by TEST, Record  ([id number]) on 02/23/2014 8:45:30 AM        Gwyneth SproutWhitney Aquiles Ruffini, MD 03/06/14 1444

## 2014-03-06 NOTE — ED Notes (Signed)
Pt brought from Radiology Department; pt getting undressed and into a gown at this time

## 2014-03-06 NOTE — ED Notes (Signed)
Pt in gown, on monitor, continuous pulse oximetry and blood pressure cuff; Rolm GalaStevi, PA Student with Dr. Patria Maneampos at bedside with pt

## 2014-03-06 NOTE — ED Provider Notes (Signed)
CSN: [id number]     Arrival date & time 03/06/14  0910 History   First MD Initiated Contact with Patient 03/06/14 [phone]     Chief Complaint  Patient presents with  . Leg Swelling  . Weakness      HPI Patient presents emergency department with increasing shortness breath over the past 2 days he's also had new swelling of his legs and of his hands.  He denies significant dietary indiscretions.  He reports he feels as though his urine output has fallen off.  Has no dysuria or urinary frequency.  He states that he has sharp discomfort in his chest, with some tightness of his chest.  He also reports "tightness" of his breathing.  Patient has a history of COPD.  He tried his nebulizer last night without improvement in his symptoms.  He denies significant orthopnea.  He reports difficulty sleeping over the last several nights because of the pain in his bilateral legs secondary to the swelling.  He reports compliance with his eliquis.  No history of congestive heart failure.  Recent admission and echocardiogram demonstrated normal EF   Past Medical History  Diagnosis Date  . Hypertension     amlodipine, lisinopril  . Stroke     was on aspirin before stroke. July 2012 in [place].  sent home aspirin.   Marland Kitchen. Hyperlipidemia     zocor 40mg   . Sick sinus syndrome     s/p pacemaker, no defibrillator  . GERD (gastroesophageal reflux disease)   . Chronic pain syndrome   . Seasonal allergies   . Asthma   . Chronic kidney disease   . GERD (gastroesophageal reflux disease)   . Arthritis   . COPD (chronic obstructive pulmonary disease)   . Lumbar disc disease    Past Surgical History  Procedure Laterality Date  . Pacemaker insertion    . Lumbar laminectomy      5 back surgeries  . Hand surgery      3 broken bones  . Bunionectomy      right foot   Family History  Problem Relation Age of Onset  . Stroke      grandfather and uncle died of stroke  . Heart attack      uncle  . Cancer Father     throat   . Cancer - Other Father     throat  . Transient ischemic attack Mother     6987 and doing well  . Heart disease Mother   . COPD Mother   . Hypertension Sister   . COPD Sister   . Hypertension Sister   . COPD Sister   . Hypertension Sister   . COPD Sister   . Hypertension Sister   . COPD Sister   . Hypertension Sister   . COPD Sister    History  Substance Use Topics  . Smoking status: Former Smoker    Quit date: 11/21/2013  . Smokeless tobacco: Not on file  . Alcohol Use: No    Review of Systems  All other systems reviewed and are negative.     Allergies  Dilaudid  Home Medications   Prior to Admission medications   Medication Sig Start Date End Date Taking? Authorizing Provider  albuterol (PROVENTIL HFA;VENTOLIN HFA) 108 (90 BASE) MCG/ACT inhaler Inhale 2 puffs into the lungs every 6 (six) hours as needed for wheezing or shortness of breath.     Historical Provider, MD  amLODipine (NORVASC) 5 MG tablet Take 1 tablet (5  mg total) by mouth daily. 01/15/14   Duke SalviaSteven C Klein, MD  apixaban (ELIQUIS) 5 MG TABS tablet Take 1 tablet (5 mg total) by mouth 2 (two) times daily. 01/15/14   Duke SalviaSteven C Klein, MD  atorvastatin (LIPITOR) 20 MG tablet Take 20 mg by mouth daily.    Historical Provider, MD  benzonatate (TESSALON) 100 MG capsule Take 100 mg by mouth every 8 (eight) hours as needed for cough.    Historical Provider, MD  budesonide-formoterol (SYMBICORT) 160-4.5 MCG/ACT inhaler Inhale 2 puffs into the lungs daily.    Historical Provider, MD  doxycycline (VIBRAMYCIN) 100 MG capsule Take 1 capsule (100 mg total) by mouth 2 (two) times daily. 02/23/14   Kela MillinAdeline C Viyuoh, MD  gabapentin (NEURONTIN) 100 MG capsule Take 100 mg by mouth 3 (three) times daily.    Historical Provider, MD  lisinopril (PRINIVIL,ZESTRIL) 10 MG tablet Take 10 mg by mouth daily.    Historical Provider, MD  omeprazole (PRILOSEC) 40 MG capsule Take 1 capsule (40 mg total) by mouth 2 (two) times daily before a  meal. 02/23/14   Adeline C Viyuoh, MD  oxyCODONE-acetaminophen (PERCOCET/ROXICET) 5-325 MG per tablet Take 1-2 tablets by mouth every 4 (four) hours as needed for severe pain. 02/23/14   Kela MillinAdeline C Viyuoh, MD  polyethylene glycol (MIRALAX / GLYCOLAX) packet Take 17 g by mouth daily.    Historical Provider, MD  predniSONE (DELTASONE) 20 MG tablet Take 2 tablets (40 mg total) by mouth daily with breakfast. 02/23/14   Adeline C Viyuoh, MD   BP 128/81  Pulse 73  Temp(Src) 97.3 F (36.3 C) (Oral)  Resp 17  Wt 317 lb 2 oz (143.847 kg)  SpO2 95% Physical Exam  Nursing note and vitals reviewed. Constitutional: He is oriented to person, place, and time. He appears well-developed and well-nourished.  HENT:  Head: Normocephalic and atraumatic.  Eyes: EOM are normal.  Neck: Normal range of motion.  Cardiovascular: Normal rate, regular rhythm, normal heart sounds and intact distal pulses.   Pulmonary/Chest: Effort normal and breath sounds normal. No respiratory distress.  No pitting edema of his abdomen.  Questionable small diastases of his rectal muscles with small easily reducible hernia.  No overlying skin changes  Abdominal: Soft. He exhibits no distension. There is no tenderness.  Musculoskeletal: Normal range of motion.  1+ pitting edema bilaterally.  No unilateral leg swelling.  Mild generalized swelling of his hands without pitting edema.  Neurological: He is alert and oriented to person, place, and time.  Skin: Skin is warm and dry.  Psychiatric: He has a normal mood and affect. Judgment normal.    ED Course  Procedures (including critical care time) Labs Review Labs Reviewed  CBC  BASIC METABOLIC PANEL  PRO B NATRIURETIC PEPTIDE  I-STAT TROPOININ, ED    Imaging Review Dg Chest 2 View  03/06/2014   CLINICAL DATA:  Leg swelling, dizziness, shortness of Breath  EXAM: CHEST  2 VIEW  COMPARISON:  02/21/2014  FINDINGS: Mild cardiomegaly. Low lung volumes. No confluent airspace opacities  or effusions. Left pacer remains in place, unchanged. No acute bony abnormality.  IMPRESSION: Low lung volumes.  Mild cardiomegaly.  No active disease.   Electronically Signed   By: Charlett NoseKevin  Dover M.D.   On: 03/06/2014 10:10  I personally reviewed the imaging tests through PACS system I reviewed available ER/hospitalization records through the EMR  ECG interpretation  Date: 03/06/2014  Rate: 73   Rhythm: normal sinus rhythm  QRS Axis: normal  Intervals: normal  ST/T Wave abnormalities: normal  Conduction Disutrbances: none  Narrative Interpretation:   Old EKG Reviewed: No significant changes noted      MDM   Final diagnoses:  None    Suspicion is higher for COPD exacerbation.  Facial be discharged home with albuterol prednisone.  Low suspicion for congestive heart failure.  Suspect dietary indiscretion as the cause of his edema.  Patient is on Eliquis, therefore my suspicion for DVT and PE is much lower.  No hypoxia.  No increased work of breathing.  Discharge home with PCP followup.  Patient also told to call and followup with pulmonology.    Lyanne Co, MD 03/06/14 1240

## 2014-03-06 NOTE — ED Notes (Signed)
Per pt sts that he is having BLE swelling and hand swelling. sts he has been trying to elevate but not getting better. sts also he feels weak and is having some dizziness.

## 2014-03-06 NOTE — Discharge Instructions (Signed)

## 2014-03-08 ENCOUNTER — Emergency Department (HOSPITAL_COMMUNITY): Payer: Medicare Other

## 2014-03-08 ENCOUNTER — Encounter (HOSPITAL_COMMUNITY): Payer: Self-pay | Admitting: Emergency Medicine

## 2014-03-08 ENCOUNTER — Emergency Department (HOSPITAL_COMMUNITY)
Admission: EM | Admit: 2014-03-08 | Discharge: 2014-03-08 | Disposition: A | Discharge status: Home / Self Care | Payer: Medicare Other | Attending: Emergency Medicine | Admitting: Emergency Medicine

## 2014-03-08 DIAGNOSIS — Z8739 Personal history of other diseases of the musculoskeletal system and connective tissue: Secondary | ICD-10-CM | POA: Diagnosis not present

## 2014-03-08 DIAGNOSIS — M25473 Effusion, unspecified ankle: Secondary | ICD-10-CM

## 2014-03-08 DIAGNOSIS — G894 Chronic pain syndrome: Secondary | ICD-10-CM | POA: Diagnosis not present

## 2014-03-08 DIAGNOSIS — Z8673 Personal history of transient ischemic attack (TIA), and cerebral infarction without residual deficits: Secondary | ICD-10-CM | POA: Diagnosis not present

## 2014-03-08 DIAGNOSIS — Z87891 Personal history of nicotine dependence: Secondary | ICD-10-CM | POA: Diagnosis not present

## 2014-03-08 DIAGNOSIS — Z7901 Long term (current) use of anticoagulants: Secondary | ICD-10-CM | POA: Diagnosis not present

## 2014-03-08 DIAGNOSIS — J4489 Other specified chronic obstructive pulmonary disease: Secondary | ICD-10-CM | POA: Insufficient documentation

## 2014-03-08 DIAGNOSIS — I129 Hypertensive chronic kidney disease with stage 1 through stage 4 chronic kidney disease, or unspecified chronic kidney disease: Secondary | ICD-10-CM | POA: Diagnosis not present

## 2014-03-08 DIAGNOSIS — Z9889 Other specified postprocedural states: Secondary | ICD-10-CM | POA: Insufficient documentation

## 2014-03-08 DIAGNOSIS — K219 Gastro-esophageal reflux disease without esophagitis: Secondary | ICD-10-CM | POA: Insufficient documentation

## 2014-03-08 DIAGNOSIS — Z950 Presence of cardiac pacemaker: Secondary | ICD-10-CM | POA: Insufficient documentation

## 2014-03-08 DIAGNOSIS — Z79899 Other long term (current) drug therapy: Secondary | ICD-10-CM | POA: Diagnosis not present

## 2014-03-08 DIAGNOSIS — M7989 Other specified soft tissue disorders: Secondary | ICD-10-CM

## 2014-03-08 DIAGNOSIS — IMO0002 Reserved for concepts with insufficient information to code with codable children: Secondary | ICD-10-CM | POA: Diagnosis not present

## 2014-03-08 DIAGNOSIS — M25476 Effusion, unspecified foot: Principal | ICD-10-CM | POA: Insufficient documentation

## 2014-03-08 DIAGNOSIS — N189 Chronic kidney disease, unspecified: Secondary | ICD-10-CM | POA: Diagnosis not present

## 2014-03-08 DIAGNOSIS — E785 Hyperlipidemia, unspecified: Secondary | ICD-10-CM | POA: Insufficient documentation

## 2014-03-08 DIAGNOSIS — J449 Chronic obstructive pulmonary disease, unspecified: Secondary | ICD-10-CM | POA: Insufficient documentation

## 2014-03-08 LAB — BASIC METABOLIC PANEL
Anion gap: 11 (ref 5–15)
BUN: 21 mg/dL (ref 6–23)
CHLORIDE: 108 meq/L (ref 96–112)
CO2: 26 mEq/L (ref 19–32)
Calcium: 9.3 mg/dL (ref 8.4–10.5)
Creatinine, Ser: 1.5 mg/dL — ABNORMAL HIGH (ref 0.50–1.35)
GFR calc Af Amer: 58 mL/min — ABNORMAL LOW (ref >90)
GFR, EST NON AFRICAN AMERICAN: 50 mL/min — AB (ref >90)
GLUCOSE: 95 mg/dL (ref 70–99)
Potassium: 4.3 mEq/L (ref 3.7–5.3)
SODIUM: 145 meq/L (ref 137–147)

## 2014-03-08 LAB — URINALYSIS, ROUTINE W REFLEX MICROSCOPIC
Bilirubin Urine: NEGATIVE
Glucose, UA: NEGATIVE mg/dL
HGB URINE DIPSTICK: NEGATIVE
Ketones, ur: NEGATIVE mg/dL
Leukocytes, UA: NEGATIVE
Nitrite: NEGATIVE
PROTEIN: NEGATIVE mg/dL
Specific Gravity, Urine: 1.025 (ref 1.005–1.030)
Urobilinogen, UA: 1 mg/dL (ref 0.0–1.0)
pH: 5.5 (ref 5.0–8.0)

## 2014-03-08 LAB — HEPATIC FUNCTION PANEL
ALK PHOS: 99 U/L (ref 39–117)
ALT: 88 U/L — ABNORMAL HIGH (ref 0–53)
AST: 40 U/L — ABNORMAL HIGH (ref 0–37)
Albumin: 3.6 g/dL (ref 3.5–5.2)
Bilirubin, Direct: <0.2 mg/dL (ref 0.0–0.3)
Total Bilirubin: 0.4 mg/dL (ref 0.3–1.2)
Total Protein: 6.5 g/dL (ref 6.0–8.3)

## 2014-03-08 LAB — CBC WITH DIFFERENTIAL/PLATELET
Basophils Absolute: 0 10*3/uL (ref 0.0–0.1)
Basophils Relative: 0 % (ref 0–1)
EOS ABS: 0.2 10*3/uL (ref 0.0–0.7)
Eosinophils Relative: 3 % (ref 0–5)
HCT: 38.6 % — ABNORMAL LOW (ref 39.0–52.0)
Hemoglobin: 13 g/dL (ref 13.0–17.0)
LYMPHS ABS: 2.5 10*3/uL (ref 0.7–4.0)
LYMPHS PCT: 46 % (ref 12–46)
MCH: 30 pg (ref 26.0–34.0)
MCHC: 33.7 g/dL (ref 30.0–36.0)
MCV: 89.1 fL (ref 78.0–100.0)
Monocytes Absolute: 0.5 10*3/uL (ref 0.1–1.0)
Monocytes Relative: 10 % (ref 3–12)
NEUTROS ABS: 2.2 10*3/uL (ref 1.7–7.7)
NEUTROS PCT: 41 % — AB (ref 43–77)
Platelets: 169 10*3/uL (ref 150–400)
RBC: 4.33 MIL/uL (ref 4.22–5.81)
RDW: 15.3 % (ref 11.5–15.5)
WBC: 5.3 10*3/uL (ref 4.0–10.5)

## 2014-03-08 LAB — I-STAT TROPONIN, ED: TROPONIN I, POC: 0 ng/mL (ref 0.00–0.08)

## 2014-03-08 LAB — PRO B NATRIURETIC PEPTIDE: PRO B NATRI PEPTIDE: 61.1 pg/mL (ref 0–125)

## 2014-03-08 MED ORDER — OXYCODONE-ACETAMINOPHEN 5-325 MG PO TABS
2.0000 | ORAL_TABLET | Freq: Once | ORAL | Status: AC
  Administered 2014-03-08: 2 via ORAL
  Filled 2014-03-08: qty 2

## 2014-03-08 MED ORDER — HYDROCHLOROTHIAZIDE 25 MG PO TABS: 25.0000 mg | ORAL_TABLET | Freq: Every day | ORAL | Status: DC

## 2014-03-08 NOTE — ED Notes (Signed)
Pt c/o bilat leg and feet sweeling as well as bilat hand swelling that started around Tuesday. Pt was seen at [facility] two days ago and was discharged the same day. Pt states that he left hand so swollen he is unable to grasp and feels like it is going to bust.

## 2014-03-08 NOTE — ED Provider Notes (Signed)
Medical screening examination/treatment/procedure(s) were conducted as a shared visit with non-physician practitioner(s) and myself.  I personally evaluated the patient during the encounter.   EKG Interpretation None      Patient be placed on 25 mg hydrochlorothiazide.  Patient seems somewhat frustrated as he feels as though he needs to be on diuretics.  States he was on diuretics for 20 years and is the only thing that helps.  Patient has no protein in his urine.  Low suspicion for nephrotic syndrome.  No signs of congestive heart failure.  Swelling is no worse than when I saw him several days ago.  Be referred back to his primary care physician and referred to nephrology for outpatient evaluation.  He's had just very mild worsening of his renal function which still falls under renal insufficiency.  Doubt acute renal failure.  Still urinating.  No indication for admission to the hospital for additional testing in the emergency department at this time.  Lyanne CoKevin M Darya Bigler, MD 03/08/14 [phone]

## 2014-03-08 NOTE — ED Provider Notes (Signed)
CSN: [id number]     Arrival date & time 03/08/14  1145 History   First MD Initiated Contact with Patient 03/08/14 1200     Chief Complaint  Patient presents with  . Leg Swelling  . hand swelling      (Consider location/radiation/quality/duration/timing/severity/associated sxs/prior Treatment) The history is provided by the patient and medical records.   This is a 58 year old male with past medical history significant for hypertension, stroke currently on eliquis, hyperlipidemia, chronic kidney disease, COPD, presenting to the ED for swelling of ankles and feet as well as bilateral hands for the past week. Patient was seen in the ED 2 days ago for the same. He states he was discharged home on prednisone, but states symptoms have not improved. He states his hands and feet feel extremely "tight" as if they may burst.  He denies any numbness, weakness, or paresthesias of extremities. He denies any current chest pain, shortness of breath, palpitations, dizziness, or weakness.  States he knows this is not related to his COPD because it "feels different."  Pt was given dose of lasix while in the ED 2 days ago, states it helped sx briefly.  VS stable.  Past Medical History  Diagnosis Date  . Hypertension     amlodipine, lisinopril  . Stroke     was on aspirin before stroke. July 2012 in [place].  sent home aspirin.   Marland Kitchen. Hyperlipidemia     zocor 40mg   . Sick sinus syndrome     s/p pacemaker, no defibrillator  . GERD (gastroesophageal reflux disease)   . Chronic pain syndrome   . Seasonal allergies   . Asthma   . Chronic kidney disease   . GERD (gastroesophageal reflux disease)   . Arthritis   . COPD (chronic obstructive pulmonary disease)   . Lumbar disc disease    Past Surgical History  Procedure Laterality Date  . Pacemaker insertion    . Lumbar laminectomy      5 back surgeries  . Hand surgery      3 broken bones  . Bunionectomy      right foot   Family History  Problem Relation Age  of Onset  . Stroke      grandfather and uncle died of stroke  . Heart attack      uncle  . Cancer Father     throat  . Cancer - Other Father     throat  . Transient ischemic attack Mother     1687 and doing well  . Heart disease Mother   . COPD Mother   . Hypertension Sister   . COPD Sister   . Hypertension Sister   . COPD Sister   . Hypertension Sister   . COPD Sister   . Hypertension Sister   . COPD Sister   . Hypertension Sister   . COPD Sister    History  Substance Use Topics  . Smoking status: Former Smoker    Quit date: 11/21/2013  . Smokeless tobacco: Not on file  . Alcohol Use: No    Review of Systems  Musculoskeletal: Positive for joint swelling.  All other systems reviewed and are negative.     Allergies  Dilaudid  Home Medications   Prior to Admission medications   Medication Sig Start Date End Date Taking? Authorizing Provider  albuterol (PROVENTIL HFA;VENTOLIN HFA) 108 (90 BASE) MCG/ACT inhaler Inhale 2 puffs into the lungs 2 (two) times daily as needed for wheezing or shortness of breath.  Yes Historical Provider, MD  albuterol (PROVENTIL) (2.5 MG/3ML) 0.083% nebulizer solution Take 2.5 mg by nebulization 2 (two) times daily.    Yes Historical Provider, MD  amLODipine (NORVASC) 5 MG tablet Take 1 tablet (5 mg total) by mouth daily. 01/15/14  Yes Duke Salvia, MD  apixaban (ELIQUIS) 5 MG TABS tablet Take 1 tablet (5 mg total) by mouth 2 (two) times daily. 01/15/14  Yes Duke Salvia, MD  atorvastatin (LIPITOR) 20 MG tablet Take 20 mg by mouth daily.   Yes Historical Provider, MD  benzonatate (TESSALON) 100 MG capsule Take 100 mg by mouth every 8 (eight) hours as needed for cough.   Yes Historical Provider, MD  budesonide-formoterol (SYMBICORT) 160-4.5 MCG/ACT inhaler Inhale 2 puffs into the lungs daily.   Yes Historical Provider, MD  gabapentin (NEURONTIN) 100 MG capsule Take 100 mg by mouth 3 (three) times daily.   Yes Historical Provider, MD   lisinopril (PRINIVIL,ZESTRIL) 10 MG tablet Take 10 mg by mouth daily.   Yes Historical Provider, MD  omeprazole (PRILOSEC) 20 MG capsule Take 20 mg by mouth daily.   Yes Historical Provider, MD  oxyCODONE (OXY IR/ROXICODONE) 5 MG immediate release tablet Take 5 mg by mouth every 12 (twelve) hours.   Yes Historical Provider, MD  polyethylene glycol (MIRALAX / GLYCOLAX) packet Take 17 g by mouth daily.   Yes Historical Provider, MD  predniSONE (DELTASONE) 10 MG tablet Take 6 tablets (60 mg total) by mouth daily. 03/06/14  Yes Lyanne Co, MD  tiZANidine (ZANAFLEX) 4 MG tablet Take 4 mg by mouth 2 (two) times daily as needed for muscle spasms.   Yes Historical Provider, MD   BP 106/62  Pulse 59  Temp(Src) 98 F (36.7 C) (Oral)  Resp 18  SpO2 96%  Physical Exam  Nursing note and vitals reviewed. Constitutional: He is oriented to person, place, and time. He appears well-developed and well-nourished. No distress.  HENT:  Head: Normocephalic and atraumatic.  Mouth/Throat: Oropharynx is clear and moist.  Eyes: Conjunctivae and EOM are normal. Pupils are equal, round, and reactive to light.  Neck: Normal range of motion. Neck supple.  Cardiovascular: Normal rate, regular rhythm and normal heart sounds.   Pulmonary/Chest: Effort normal and breath sounds normal. No respiratory distress. He has no wheezes.  Abdominal: Soft. Bowel sounds are normal. There is no tenderness. There is no guarding.  Musculoskeletal: Normal range of motion.  Trace edema of bilateral ankles, no pretibial edema noted; mild swelling of bilateral hand swelling Compartments of extremities remain soft, no appreciated calf asymmetry, tenderness, or palpable cords; no overlying erythema, induration, or signs of cellulitis; no warmth to touch; distal pulses intact of all 4 extremities  Neurological: He is alert and oriented to person, place, and time.  Skin: Skin is warm and dry. He is not diaphoretic.  Psychiatric: He has a  normal mood and affect.    ED Course  Procedures (including critical care time) Labs Review Labs Reviewed  CBC WITH DIFFERENTIAL - Abnormal; Notable for the following:    HCT 38.6 (*)    Neutrophils Relative % 41 (*)    All other components within normal limits  BASIC METABOLIC PANEL - Abnormal; Notable for the following:    Creatinine, Ser 1.50 (*)    GFR calc non Af Amer 50 (*)    GFR calc Af Amer 58 (*)    All other components within normal limits  HEPATIC FUNCTION PANEL - Abnormal; Notable for the following:  AST 40 (*)    ALT 88 (*)    All other components within normal limits  PRO B NATRIURETIC PEPTIDE  URINALYSIS, ROUTINE W REFLEX MICROSCOPIC  I-STAT TROPOININ, ED    Imaging Review Dg Chest 2 View  03/08/2014   CLINICAL DATA:  LEG SWELLING  EXAM: CHEST  2 VIEW  COMPARISON:  Two-view chest 03/06/2014  FINDINGS: Low lung volumes. Cardiac silhouette is enlarged and stable. No focal regions of consolidation or focal infiltrates. Stable left chest wall cardiac pacing unit. No acute osseous abnormalities.  IMPRESSION: Low lung volumes.  No acute cardiopulmonary disease.   Electronically Signed   By: Salome Holmes M.D.   On: 03/08/2014 13:56     EKG Interpretation None      MDM   Final diagnoses:  Ankle swelling, unspecified laterality  Bilateral hand swelling   58 year old male presenting with bilateral ankle and hand swelling. Seen 2 days ago at [facility] for the same. He'll start on prednisone at that time, but states his symptoms are not improving. On exam today he has no signs of DVT and is currently on eliquis making this very unlikely.  Will obtain screening labs, EKG, CXR.  EKG sinus rhythm without ischemic change.  Troponin negative. Chest x-ray is clear.  Labs with mild elevation of serum creatinine when compared with previous, 1.5 today compared with 1.30 from 2 days ago.  Mild elevation of AST/ALT, bili WNL.  Exact cause of patients peripheral edema not  identified but does not appear to be fluid overload from CHF or DVT, possible dietary sodium intake related.  Case discussed with attending physician, Dr. Patria Mane-- recommends starting on HCTZ 25mg  daily, FU with PCP and nephrology.  Discussed plan with patient, he/she acknowledged understanding and agreed with plan of care.  Return precautions given for new or worsening symptoms.  Garlon Hatchet, PA-C 03/08/14 1549

## 2014-03-08 NOTE — Discharge Instructions (Signed)
Take the prescribed medication as directed. Follow-up with Dr. Lowell GuitarPowell as well as your primary care physician. Return to the ED for new or worsening symptoms.

## 2014-03-08 NOTE — ED Notes (Signed)
Pt. Is unable to use the restroom at this time, but is aware that we need a urine specimen. Urinal at bedside. 

## 2014-03-28 ENCOUNTER — Other Ambulatory Visit: Payer: Self-pay | Admitting: Internal Medicine

## 2014-05-07 ENCOUNTER — Emergency Department (HOSPITAL_COMMUNITY)
Admission: EM | Admit: 2014-05-07 | Discharge: 2014-05-08 | Disposition: A | Discharge status: Home / Self Care | Payer: Medicare Other | Attending: Emergency Medicine | Admitting: Emergency Medicine

## 2014-05-07 ENCOUNTER — Emergency Department (HOSPITAL_COMMUNITY): Payer: Medicare Other

## 2014-05-07 DIAGNOSIS — N189 Chronic kidney disease, unspecified: Secondary | ICD-10-CM | POA: Diagnosis not present

## 2014-05-07 DIAGNOSIS — Z8673 Personal history of transient ischemic attack (TIA), and cerebral infarction without residual deficits: Secondary | ICD-10-CM | POA: Diagnosis not present

## 2014-05-07 DIAGNOSIS — E785 Hyperlipidemia, unspecified: Secondary | ICD-10-CM | POA: Insufficient documentation

## 2014-05-07 DIAGNOSIS — Z79899 Other long term (current) drug therapy: Secondary | ICD-10-CM | POA: Diagnosis not present

## 2014-05-07 DIAGNOSIS — I129 Hypertensive chronic kidney disease with stage 1 through stage 4 chronic kidney disease, or unspecified chronic kidney disease: Secondary | ICD-10-CM | POA: Diagnosis not present

## 2014-05-07 DIAGNOSIS — J4489 Other specified chronic obstructive pulmonary disease: Secondary | ICD-10-CM | POA: Insufficient documentation

## 2014-05-07 DIAGNOSIS — R0789 Other chest pain: Secondary | ICD-10-CM | POA: Insufficient documentation

## 2014-05-07 DIAGNOSIS — Z87891 Personal history of nicotine dependence: Secondary | ICD-10-CM | POA: Insufficient documentation

## 2014-05-07 DIAGNOSIS — K219 Gastro-esophageal reflux disease without esophagitis: Secondary | ICD-10-CM | POA: Insufficient documentation

## 2014-05-07 DIAGNOSIS — R079 Chest pain, unspecified: Secondary | ICD-10-CM | POA: Diagnosis present

## 2014-05-07 DIAGNOSIS — J449 Chronic obstructive pulmonary disease, unspecified: Secondary | ICD-10-CM | POA: Insufficient documentation

## 2014-05-07 DIAGNOSIS — G894 Chronic pain syndrome: Secondary | ICD-10-CM | POA: Diagnosis not present

## 2014-05-07 DIAGNOSIS — Z8739 Personal history of other diseases of the musculoskeletal system and connective tissue: Secondary | ICD-10-CM | POA: Insufficient documentation

## 2014-05-07 LAB — CBC WITH DIFFERENTIAL/PLATELET
Basophils Absolute: 0 10*3/uL (ref 0.0–0.1)
Basophils Relative: 0 % (ref 0–1)
Eosinophils Absolute: 0.1 10*3/uL (ref 0.0–0.7)
Eosinophils Relative: 3 % (ref 0–5)
HCT: 43 % (ref 39.0–52.0)
Hemoglobin: 14.7 g/dL (ref 13.0–17.0)
LYMPHS PCT: 46 % (ref 12–46)
Lymphs Abs: 2 10*3/uL (ref 0.7–4.0)
MCH: 30.1 pg (ref 26.0–34.0)
MCHC: 34.2 g/dL (ref 30.0–36.0)
MCV: 87.9 fL (ref 78.0–100.0)
Monocytes Absolute: 0.4 10*3/uL (ref 0.1–1.0)
Monocytes Relative: 9 % (ref 3–12)
Neutro Abs: 1.9 10*3/uL (ref 1.7–7.7)
Neutrophils Relative %: 42 % — ABNORMAL LOW (ref 43–77)
Platelets: 183 10*3/uL (ref 150–400)
RBC: 4.89 MIL/uL (ref 4.22–5.81)
RDW: 14.4 % (ref 11.5–15.5)
WBC: 4.4 10*3/uL (ref 4.0–10.5)

## 2014-05-07 LAB — COMPREHENSIVE METABOLIC PANEL
ALT: 16 U/L (ref 0–53)
AST: 14 U/L (ref 0–37)
Albumin: 3.7 g/dL (ref 3.5–5.2)
Alkaline Phosphatase: 103 U/L (ref 39–117)
Anion gap: 10 (ref 5–15)
BUN: 8 mg/dL (ref 6–23)
CO2: 27 meq/L (ref 19–32)
Calcium: 8.9 mg/dL (ref 8.4–10.5)
Chloride: 104 mEq/L (ref 96–112)
Creatinine, Ser: 1.29 mg/dL (ref 0.50–1.35)
GFR calc Af Amer: 70 mL/min — ABNORMAL LOW (ref >90)
GFR, EST NON AFRICAN AMERICAN: 60 mL/min — AB (ref >90)
Glucose, Bld: 79 mg/dL (ref 70–99)
Potassium: 3.8 mEq/L (ref 3.7–5.3)
Sodium: 141 mEq/L (ref 137–147)
Total Bilirubin: 0.3 mg/dL (ref 0.3–1.2)
Total Protein: 6.6 g/dL (ref 6.0–8.3)

## 2014-05-07 LAB — RAPID URINE DRUG SCREEN, HOSP PERFORMED
AMPHETAMINES: NOT DETECTED
Barbiturates: NOT DETECTED
Benzodiazepines: NOT DETECTED
Cocaine: POSITIVE — AB
OPIATES: POSITIVE — AB
TETRAHYDROCANNABINOL: NOT DETECTED

## 2014-05-07 LAB — TROPONIN I
Troponin I: <0.3 ng/mL (ref <0.30)
Troponin I: <0.3 ng/mL (ref <0.30)

## 2014-05-07 MED ORDER — MORPHINE SULFATE 4 MG/ML IJ SOLN
4.0000 mg | Freq: Once | INTRAMUSCULAR | Status: AC
  Administered 2014-05-07: 4 mg via INTRAVENOUS
  Filled 2014-05-07: qty 1

## 2014-05-07 NOTE — ED Provider Notes (Signed)
CSN: [id number]     Arrival date & time 05/07/14  1849 History   First MD Initiated Contact with Patient 05/07/14 1855     Chief Complaint  Patient presents with  . Chest Pain     (Consider location/radiation/quality/duration/timing/severity/associated sxs/prior Treatment) Patient is a 58 y.o. male presenting with chest pain. The history is provided by the patient. No language interpreter was used.  Chest Pain Pain location:  L chest Pain quality: aching   Pain radiates to:  L arm Pain radiates to the back: no   Pain severity:  Moderate Progression:  Worsening Associated symptoms: no fever and no numbness   Associated symptoms comment:  He is experiencing left chest pain intermittently over the last 3 days. The pain occurs at rest and has no alleviating or aggravating factors. He complains of associated nausea and radiation of the pain into his left arm. He has a history of pacemaker placement for bradycardia which occurred in [place]. He reports a catheterization in the past 2 years and that no stents were placed at that time, also in [place]. He has had local cardiology follow up with Dr. Graciela Husbands regarding the pacemaker. He reports some improvement with NTG in route via EMS, but reports persistent pain.    Past Medical History  Diagnosis Date  . Hypertension     amlodipine, lisinopril  . Stroke     was on aspirin before stroke. July 2012 in [place].  sent home aspirin.   Marland Kitchen Hyperlipidemia     zocor   . Sick sinus syndrome     s/p pacemaker, no defibrillator  . GERD (gastroesophageal reflux disease)   . Chronic pain syndrome   . Seasonal allergies   . Asthma   . Chronic kidney disease   . GERD (gastroesophageal reflux disease)   . Arthritis   . COPD (chronic obstructive pulmonary disease)   . Lumbar disc disease    Past Surgical History  Procedure Laterality Date  . Pacemaker insertion    . Lumbar laminectomy      5 back surgeries  . Hand surgery      3 broken bones  .  Bunionectomy      right foot   Family History  Problem Relation Age of Onset  . Stroke      grandfather and uncle died of stroke  . Heart attack      uncle  . Cancer Father     throat  . Cancer - Other Father     throat  . Transient ischemic attack Mother     72 and doing well  . Heart disease Mother   . COPD Mother   . Hypertension Sister   . COPD Sister   . Hypertension Sister   . COPD Sister   . Hypertension Sister   . COPD Sister   . Hypertension Sister   . COPD Sister   . Hypertension Sister   . COPD Sister    History  Substance Use Topics  . Smoking status: Former Smoker    Quit date: 11/21/2013  . Smokeless tobacco: Not on file  . Alcohol Use: No    Review of Systems  Constitutional: Negative for fever and chills.  HENT: Negative.   Respiratory: Negative.   Cardiovascular: Positive for chest pain.  Gastrointestinal: Negative.   Musculoskeletal: Negative.   Skin: Negative.   Neurological: Negative.  Negative for numbness.      Allergies  Dilaudid  Home Medications   Prior  to Admission medications   Medication Sig Start Date End Date Taking? Authorizing Provider  albuterol (PROVENTIL HFA;VENTOLIN HFA) 108 (90 BASE) MCG/ACT inhaler Inhale 2 puffs into the lungs 2 (two) times daily as needed for wheezing or shortness of breath.     Historical Provider, MD  albuterol (PROVENTIL) (2.5 MG/3ML) 0.083% nebulizer solution Take 2.5 mg by nebulization 2 (two) times daily.     Historical Provider, MD  amLODipine (NORVASC) 5 MG tablet Take 1 tablet (5 mg total) by mouth daily. 01/15/14   Duke Salvia, MD  atorvastatin (LIPITOR) 20 MG tablet Take 20 mg by mouth daily.    Historical Provider, MD  benzonatate (TESSALON) 100 MG capsule Take 100 mg by mouth every 8 (eight) hours as needed for cough.    Historical Provider, MD  budesonide-formoterol (SYMBICORT) 160-4.5 MCG/ACT inhaler Inhale 2 puffs into the lungs daily.    Historical Provider, MD  ELIQUIS 5 MG TABS  tablet TAKE ONE TABLET BY MOUTH TWICE DAILY    Duke Salvia, MD  gabapentin (NEURONTIN) 100 MG capsule Take 100 mg by mouth 3 (three) times daily.    Historical Provider, MD  hydrochlorothiazide (HYDRODIURIL) 25 MG tablet Take 1 tablet (25 mg total) by mouth daily. 03/08/14   Garlon Hatchet, PA-C  lisinopril (PRINIVIL,ZESTRIL) 10 MG tablet Take 10 mg by mouth daily.    Historical Provider, MD  omeprazole (PRILOSEC) 20 MG capsule Take 20 mg by mouth daily.    Historical Provider, MD  oxyCODONE (OXY IR/ROXICODONE) 5 MG immediate release tablet Take 5 mg by mouth every 12 (twelve) hours.    Historical Provider, MD  polyethylene glycol (MIRALAX / GLYCOLAX) packet Take 17 g by mouth daily.    Historical Provider, MD  predniSONE (DELTASONE) 10 MG tablet Take 6 tablets (60 mg total) by mouth daily. 03/06/14   Lyanne Co, MD  tiZANidine (ZANAFLEX) 4 MG tablet Take 4 mg by mouth 2 (two) times daily as needed for muscle spasms.    Historical Provider, MD   BP 129/83  Pulse 64  Temp(Src) 98.1 F (36.7 C) (Oral)  Resp 15  SpO2 98% Physical Exam  Constitutional: He is oriented to person, place, and time. He appears well-developed and well-nourished.  HENT:  Head: Normocephalic.  Neck: Normal range of motion. Neck supple.  Cardiovascular: Normal rate and regular rhythm.   Pulmonary/Chest: Effort normal and breath sounds normal. He exhibits no tenderness.  Abdominal: Soft. Bowel sounds are normal. There is no tenderness. There is no rebound and no guarding.  Musculoskeletal: Normal range of motion. He exhibits no edema.  Neurological: He is alert and oriented to person, place, and time.  Skin: Skin is warm and dry. No rash noted.  Psychiatric: He has a normal mood and affect.    ED Course  Procedures (including critical care time) Labs Review Labs Reviewed  CBC WITH DIFFERENTIAL  COMPREHENSIVE METABOLIC PANEL  TROPONIN I  URINE RAPID DRUG SCREEN (HOSP PERFORMED)   Results for orders placed  during the hospital encounter of 05/07/14  CBC WITH DIFFERENTIAL      Result Value Ref Range   WBC 4.4  4.0 - 10.5 K/uL   RBC 4.89  4.22 - 5.81 MIL/uL   Hemoglobin 14.7  13.0 - 17.0 g/dL   HCT 21.3  08.6 - 57.8 %   MCV 87.9  78.0 - 100.0 fL   MCH 30.1  26.0 - 34.0 pg   MCHC 34.2  30.0 - 36.0 g/dL  RDW 14.4  11.5 - 15.5 %   Platelets 183  150 - 400 K/uL   Neutrophils Relative % 42 (*) 43 - 77 %   Neutro Abs 1.9  1.7 - 7.7 K/uL   Lymphocytes Relative 46  12 - 46 %   Lymphs Abs 2.0  0.7 - 4.0 K/uL   Monocytes Relative 9  3 - 12 %   Monocytes Absolute 0.4  0.1 - 1.0 K/uL   Eosinophils Relative 3  0 - 5 %   Eosinophils Absolute 0.1  0.0 - 0.7 K/uL   Basophils Relative 0  0 - 1 %   Basophils Absolute 0.0  0.0 - 0.1 K/uL  COMPREHENSIVE METABOLIC PANEL      Result Value Ref Range   Sodium 141  137 - 147 mEq/L   Potassium 3.8  3.7 - 5.3 mEq/L   Chloride 104  96 - 112 mEq/L   CO2 27  19 - 32 mEq/L   Glucose, Bld 79  70 - 99 mg/dL   BUN 8  6 - 23 mg/dL   Creatinine, Ser 1.61  0.50 - 1.35 mg/dL   Calcium 8.9  8.4 - 09.6 mg/dL   Total Protein 6.6  6.0 - 8.3 g/dL   Albumin 3.7  3.5 - 5.2 g/dL   AST 14  0 - 37 U/L   ALT 16  0 - 53 U/L   Alkaline Phosphatase 103  39 - 117 U/L   Total Bilirubin 0.3  0.3 - 1.2 mg/dL   GFR calc non Af Amer 60 (*) >90 mL/min   GFR calc Af Amer 70 (*) >90 mL/min   Anion gap 10  5 - 15  TROPONIN I      Result Value Ref Range   Troponin I <0.30  <0.30 ng/mL   Dg Chest 2 View  05/07/2014   CLINICAL DATA:  Chest/left arm pain  EXAM: CHEST  2 VIEW  COMPARISON:  None.  FINDINGS: Lungs are essentially clear. No focal consolidation. No pleural effusion or pneumothorax.  Cardiomegaly.  Left subclavian pacemaker.  Mild degenerative changes of the visualized thoracolumbar spine.  IMPRESSION: No evidence of acute cardiopulmonary disease.   Electronically Signed   By: Charline Bills M.D.   On: 05/07/2014 21:16    Imaging Review No results found.   EKG  Interpretation   Date/Time:  Tuesday May 07 2014 18:58:41 EDT Ventricular Rate:  63 PR Interval:  172 QRS Duration: 102 QT Interval:  419 QTC Calculation: 429 R Axis:   68 Text Interpretation:  Sinus rhythm Atrial premature complex Abnormal  inferior Q waves unchanged from old. No STEMI Confirmed by Donnald Garre, MD,  Lebron Conners [phone]) on 05/07/2014 7:09:05 PM      MDM   Final diagnoses:  None    1. Chest pain  On previous admission (6/15): -cardiology was consulted and no further workup recommended -cardiac enzymes negative, per cardiology noncardiac  -CT scan showed negative for PE  -Bronchitis/COPD vs. GERD contributing factors in  -2-D echo with EF 60-65% and no regional wall abnormalities  -CT angiogram negative for PE   The patient has appeared comfortable in the ED, persistently on the phone, in NAD. VSS. CXR clear. No symptoms of COPD exacerbation - cough, hypoxia. Patient is established with cardiology. Discussed with cardiology fellow who reviewed above findings on last/recent admission. Troponin and delta troponin negative in ED along with EKG without acute changes. Discussed with Dr. Donnald Garre who feels patient can be followed up  in outpatient setting.   Arnoldo Hooker, PA-C 05/07/14 2333

## 2014-05-07 NOTE — ED Notes (Addendum)
Pt alert, NAD, calm, interactive, resps e/u, speaking in clear complete sentences, no dyspnea noted, watching TV and talking on phone. pending re-eval. Updated.

## 2014-05-07 NOTE — ED Notes (Signed)
Patient to ED with C/O chest pain that has been ongoing for 3 days.  EMS reports 324 mg ASA, 2 SL NTG and 4 mg Ondansetron IV given.   EMS reports that patient's AICD fired X 2 last night

## 2014-05-07 NOTE — ED Notes (Signed)
Urine obtained after receiving 2 doses of Morphine

## 2014-05-07 NOTE — ED Notes (Signed)
States that he has been having chest pain for 2.5 to 3 days.  States that he thinks that his AICD fired X 2 times last night.  Patient states that he did some drugs on Friday, cocaine and marijuana. He states that his chest feels tight and shoots down his left arm. He also states that he has an occasional sharp pain.  He C/O having intermittent nausea.

## 2014-05-08 NOTE — Discharge Instructions (Signed)

## 2014-05-09 NOTE — ED Provider Notes (Signed)
Medical screening examination/treatment/procedure(s) were performed by non-physician practitioner and as supervising physician I was immediately available for consultation/collaboration.   EKG Interpretation   Date/Time:  Tuesday May 07 2014 18:58:41 EDT Ventricular Rate:  63 PR Interval:  172 QRS Duration: 102 QT Interval:  419 QTC Calculation: 429 R Axis:   68 Text Interpretation:  Sinus rhythm Atrial premature complex Abnormal  inferior Q waves unchanged from old. No STEMI Confirmed by Donnald Garre, MD,  Lebron Conners [phone]) on 05/07/2014 7:09:05 PM       Arby Barrette, MD 05/09/14 [phone]

## 2014-06-03 ENCOUNTER — Encounter (HOSPITAL_COMMUNITY): Payer: Self-pay | Admitting: Emergency Medicine

## 2014-06-03 ENCOUNTER — Inpatient Hospital Stay (HOSPITAL_COMMUNITY)
Admission: EM | Admit: 2014-06-03 | Discharge: 2014-06-05 | DRG: 065 | Disposition: A | Discharge status: Home / Self Care | Payer: Medicare Other | Attending: Internal Medicine | Admitting: Internal Medicine

## 2014-06-03 ENCOUNTER — Inpatient Hospital Stay (HOSPITAL_COMMUNITY): Payer: Medicare Other

## 2014-06-03 ENCOUNTER — Emergency Department (HOSPITAL_COMMUNITY): Payer: Medicare Other

## 2014-06-03 DIAGNOSIS — Z950 Presence of cardiac pacemaker: Secondary | ICD-10-CM | POA: Diagnosis present

## 2014-06-03 DIAGNOSIS — M6289 Other specified disorders of muscle: Secondary | ICD-10-CM

## 2014-06-03 DIAGNOSIS — Z7901 Long term (current) use of anticoagulants: Secondary | ICD-10-CM

## 2014-06-03 DIAGNOSIS — I5032 Chronic diastolic (congestive) heart failure: Secondary | ICD-10-CM | POA: Diagnosis present

## 2014-06-03 DIAGNOSIS — Z9119 Patient's noncompliance with other medical treatment and regimen: Secondary | ICD-10-CM | POA: Diagnosis present

## 2014-06-03 DIAGNOSIS — E119 Type 2 diabetes mellitus without complications: Secondary | ICD-10-CM | POA: Diagnosis present

## 2014-06-03 DIAGNOSIS — F1410 Cocaine abuse, uncomplicated: Secondary | ICD-10-CM | POA: Diagnosis present

## 2014-06-03 DIAGNOSIS — G894 Chronic pain syndrome: Secondary | ICD-10-CM | POA: Diagnosis present

## 2014-06-03 DIAGNOSIS — Z720 Tobacco use: Secondary | ICD-10-CM | POA: Diagnosis present

## 2014-06-03 DIAGNOSIS — Z7151 Drug abuse counseling and surveillance of drug abuser: Secondary | ICD-10-CM | POA: Diagnosis not present

## 2014-06-03 DIAGNOSIS — G459 Transient cerebral ischemic attack, unspecified: Secondary | ICD-10-CM

## 2014-06-03 DIAGNOSIS — R079 Chest pain, unspecified: Secondary | ICD-10-CM

## 2014-06-03 DIAGNOSIS — I480 Paroxysmal atrial fibrillation: Secondary | ICD-10-CM | POA: Diagnosis present

## 2014-06-03 DIAGNOSIS — J449 Chronic obstructive pulmonary disease, unspecified: Secondary | ICD-10-CM | POA: Diagnosis present

## 2014-06-03 DIAGNOSIS — K219 Gastro-esophageal reflux disease without esophagitis: Secondary | ICD-10-CM | POA: Diagnosis present

## 2014-06-03 DIAGNOSIS — I495 Sick sinus syndrome: Secondary | ICD-10-CM | POA: Diagnosis present

## 2014-06-03 DIAGNOSIS — Z8673 Personal history of transient ischemic attack (TIA), and cerebral infarction without residual deficits: Secondary | ICD-10-CM | POA: Diagnosis not present

## 2014-06-03 DIAGNOSIS — I129 Hypertensive chronic kidney disease with stage 1 through stage 4 chronic kidney disease, or unspecified chronic kidney disease: Secondary | ICD-10-CM | POA: Diagnosis present

## 2014-06-03 DIAGNOSIS — Z8249 Family history of ischemic heart disease and other diseases of the circulatory system: Secondary | ICD-10-CM

## 2014-06-03 DIAGNOSIS — I639 Cerebral infarction, unspecified: Secondary | ICD-10-CM | POA: Diagnosis present

## 2014-06-03 DIAGNOSIS — I059 Rheumatic mitral valve disease, unspecified: Secondary | ICD-10-CM

## 2014-06-03 DIAGNOSIS — G4733 Obstructive sleep apnea (adult) (pediatric): Secondary | ICD-10-CM | POA: Diagnosis present

## 2014-06-03 DIAGNOSIS — R200 Anesthesia of skin: Secondary | ICD-10-CM

## 2014-06-03 DIAGNOSIS — Z825 Family history of asthma and other chronic lower respiratory diseases: Secondary | ICD-10-CM | POA: Diagnosis not present

## 2014-06-03 DIAGNOSIS — F17210 Nicotine dependence, cigarettes, uncomplicated: Secondary | ICD-10-CM | POA: Diagnosis present

## 2014-06-03 DIAGNOSIS — R531 Weakness: Secondary | ICD-10-CM | POA: Diagnosis present

## 2014-06-03 DIAGNOSIS — Z6841 Body Mass Index (BMI) 40.0 and over, adult: Secondary | ICD-10-CM

## 2014-06-03 DIAGNOSIS — Z823 Family history of stroke: Secondary | ICD-10-CM

## 2014-06-03 DIAGNOSIS — Z79899 Other long term (current) drug therapy: Secondary | ICD-10-CM | POA: Diagnosis not present

## 2014-06-03 DIAGNOSIS — N189 Chronic kidney disease, unspecified: Secondary | ICD-10-CM | POA: Diagnosis present

## 2014-06-03 DIAGNOSIS — J45909 Unspecified asthma, uncomplicated: Secondary | ICD-10-CM | POA: Diagnosis present

## 2014-06-03 DIAGNOSIS — E785 Hyperlipidemia, unspecified: Secondary | ICD-10-CM | POA: Diagnosis present

## 2014-06-03 HISTORY — DX: Presence of cardiac pacemaker: Z95.0

## 2014-06-03 LAB — CBC WITH DIFFERENTIAL/PLATELET
BASOS ABS: 0 10*3/uL (ref 0.0–0.1)
BASOS PCT: 1 % (ref 0–1)
Eosinophils Absolute: 0.2 10*3/uL (ref 0.0–0.7)
Eosinophils Relative: 4 % (ref 0–5)
HEMATOCRIT: 42.2 % (ref 39.0–52.0)
Hemoglobin: 14.1 g/dL (ref 13.0–17.0)
Lymphocytes Relative: 40 % (ref 12–46)
Lymphs Abs: 2 10*3/uL (ref 0.7–4.0)
MCH: 29.3 pg (ref 26.0–34.0)
MCHC: 33.4 g/dL (ref 30.0–36.0)
MCV: 87.6 fL (ref 78.0–100.0)
MONO ABS: 0.4 10*3/uL (ref 0.1–1.0)
Monocytes Relative: 8 % (ref 3–12)
NEUTROS ABS: 2.5 10*3/uL (ref 1.7–7.7)
Neutrophils Relative %: 49 % (ref 43–77)
PLATELETS: 171 10*3/uL (ref 150–400)
RBC: 4.82 MIL/uL (ref 4.22–5.81)
RDW: 14.3 % (ref 11.5–15.5)
WBC: 5.1 10*3/uL (ref 4.0–10.5)

## 2014-06-03 LAB — CBC
HCT: 43.4 % (ref 39.0–52.0)
HEMOGLOBIN: 15 g/dL (ref 13.0–17.0)
MCH: 30.1 pg (ref 26.0–34.0)
MCHC: 34.6 g/dL (ref 30.0–36.0)
MCV: 87 fL (ref 78.0–100.0)
PLATELETS: 196 10*3/uL (ref 150–400)
RBC: 4.99 MIL/uL (ref 4.22–5.81)
RDW: 14.3 % (ref 11.5–15.5)
WBC: 6.2 10*3/uL (ref 4.0–10.5)

## 2014-06-03 LAB — COMPREHENSIVE METABOLIC PANEL
ALBUMIN: 3.8 g/dL (ref 3.5–5.2)
ALK PHOS: 112 U/L (ref 39–117)
ALK PHOS: 102 U/L (ref 39–117)
ALT: 17 U/L (ref 0–53)
ALT: 16 U/L (ref 0–53)
ANION GAP: 10 (ref 5–15)
AST: 18 U/L (ref 0–37)
AST: 17 U/L (ref 0–37)
Albumin: 4.1 g/dL (ref 3.5–5.2)
Anion gap: 13 (ref 5–15)
BILIRUBIN TOTAL: 0.4 mg/dL (ref 0.3–1.2)
BILIRUBIN TOTAL: 0.4 mg/dL (ref 0.3–1.2)
BUN: 8 mg/dL (ref 6–23)
BUN: 7 mg/dL (ref 6–23)
CO2: 24 meq/L (ref 19–32)
CO2: 26 meq/L (ref 19–32)
CREATININE: 1.36 mg/dL — AB (ref 0.50–1.35)
Calcium: 9.2 mg/dL (ref 8.4–10.5)
Calcium: 8.9 mg/dL (ref 8.4–10.5)
Chloride: 102 mEq/L (ref 96–112)
Chloride: 104 mEq/L (ref 96–112)
Creatinine, Ser: 1.36 mg/dL — ABNORMAL HIGH (ref 0.50–1.35)
GFR calc Af Amer: 65 mL/min — ABNORMAL LOW (ref >90)
GFR calc non Af Amer: 56 mL/min — ABNORMAL LOW (ref >90)
GFR, EST AFRICAN AMERICAN: 65 mL/min — AB (ref >90)
GFR, EST NON AFRICAN AMERICAN: 56 mL/min — AB (ref >90)
GLUCOSE: 113 mg/dL — AB (ref 70–99)
Glucose, Bld: 99 mg/dL (ref 70–99)
POTASSIUM: 3.6 meq/L — AB (ref 3.7–5.3)
POTASSIUM: 3.4 meq/L — AB (ref 3.7–5.3)
Sodium: 139 mEq/L (ref 137–147)
Sodium: 140 mEq/L (ref 137–147)
TOTAL PROTEIN: 7.4 g/dL (ref 6.0–8.3)
Total Protein: 6.6 g/dL (ref 6.0–8.3)

## 2014-06-03 LAB — PRO B NATRIURETIC PEPTIDE: Pro B Natriuretic peptide (BNP): 71.2 pg/mL (ref 0–125)

## 2014-06-03 LAB — DIFFERENTIAL
Basophils Absolute: 0 10*3/uL (ref 0.0–0.1)
Basophils Relative: 0 % (ref 0–1)
Eosinophils Absolute: 0.1 10*3/uL (ref 0.0–0.7)
Eosinophils Relative: 2 % (ref 0–5)
LYMPHS ABS: 2.2 10*3/uL (ref 0.7–4.0)
Lymphocytes Relative: 35 % (ref 12–46)
MONOS PCT: 7 % (ref 3–12)
Monocytes Absolute: 0.4 10*3/uL (ref 0.1–1.0)
NEUTROS ABS: 3.4 10*3/uL (ref 1.7–7.7)
NEUTROS PCT: 56 % (ref 43–77)

## 2014-06-03 LAB — RAPID URINE DRUG SCREEN, HOSP PERFORMED
Amphetamines: NOT DETECTED
Barbiturates: NOT DETECTED
Benzodiazepines: NOT DETECTED
COCAINE: POSITIVE — AB
OPIATES: NOT DETECTED
TETRAHYDROCANNABINOL: NOT DETECTED

## 2014-06-03 LAB — TSH: TSH: 1.76 u[IU]/mL (ref 0.350–4.500)

## 2014-06-03 LAB — I-STAT TROPONIN, ED: Troponin i, poc: 0.01 ng/mL (ref 0.00–0.08)

## 2014-06-03 LAB — SEDIMENTATION RATE: SED RATE: 0 mm/h (ref 0–16)

## 2014-06-03 LAB — APTT: aPTT: 29 seconds (ref 24–37)

## 2014-06-03 LAB — PROTIME-INR
INR: 0.99 (ref 0.00–1.49)
Prothrombin Time: 13.1 seconds (ref 11.6–15.2)

## 2014-06-03 LAB — TROPONIN I

## 2014-06-03 MED ORDER — ALBUTEROL SULFATE HFA 108 (90 BASE) MCG/ACT IN AERS: 2.0000 | INHALATION_SPRAY | Freq: Two times a day (BID) | RESPIRATORY_TRACT | Status: DC | PRN

## 2014-06-03 MED ORDER — AMLODIPINE BESYLATE 5 MG PO TABS
5.0000 mg | ORAL_TABLET | Freq: Every day | ORAL | Status: DC
  Administered 2014-06-03 – 2014-06-05 (×3): 5 mg via ORAL
  Filled 2014-06-03 (×4): qty 1

## 2014-06-03 MED ORDER — APIXABAN 5 MG PO TABS
5.0000 mg | ORAL_TABLET | Freq: Two times a day (BID) | ORAL | Status: DC
  Administered 2014-06-03 – 2014-06-05 (×5): 5 mg via ORAL
  Filled 2014-06-03 (×6): qty 1

## 2014-06-03 MED ORDER — LISINOPRIL 10 MG PO TABS
10.0000 mg | ORAL_TABLET | Freq: Every day | ORAL | Status: DC
  Administered 2014-06-03 – 2014-06-05 (×3): 10 mg via ORAL
  Filled 2014-06-03 (×3): qty 1

## 2014-06-03 MED ORDER — SENNOSIDES-DOCUSATE SODIUM 8.6-50 MG PO TABS
1.0000 | ORAL_TABLET | Freq: Every evening | ORAL | Status: DC | PRN
  Filled 2014-06-03: qty 1

## 2014-06-03 MED ORDER — ALBUTEROL SULFATE (2.5 MG/3ML) 0.083% IN NEBU
2.5000 mg | INHALATION_SOLUTION | Freq: Two times a day (BID) | RESPIRATORY_TRACT | Status: DC
  Administered 2014-06-03 – 2014-06-05 (×4): 2.5 mg via RESPIRATORY_TRACT
  Filled 2014-06-03 (×5): qty 3

## 2014-06-03 MED ORDER — ATORVASTATIN CALCIUM 20 MG PO TABS
20.0000 mg | ORAL_TABLET | Freq: Every day | ORAL | Status: DC
  Administered 2014-06-03 – 2014-06-05 (×3): 20 mg via ORAL
  Filled 2014-06-03 (×3): qty 1

## 2014-06-03 MED ORDER — ALBUTEROL SULFATE (2.5 MG/3ML) 0.083% IN NEBU
2.5000 mg | INHALATION_SOLUTION | Freq: Two times a day (BID) | RESPIRATORY_TRACT | Status: DC | PRN
  Administered 2014-06-04: 2.5 mg via RESPIRATORY_TRACT
  Filled 2014-06-03: qty 3

## 2014-06-03 MED ORDER — IOHEXOL 350 MG/ML SOLN
80.0000 mL | Freq: Once | INTRAVENOUS | Status: AC | PRN
Start: 2014-06-03 — End: 2014-06-03
  Administered 2014-06-03: 80 mL via INTRAVENOUS

## 2014-06-03 MED ORDER — METOCLOPRAMIDE HCL 5 MG/ML IJ SOLN
10.0000 mg | Freq: Once | INTRAMUSCULAR | Status: AC
  Administered 2014-06-03: 10 mg via INTRAVENOUS
  Filled 2014-06-03: qty 2

## 2014-06-03 MED ORDER — OXYCODONE HCL 5 MG PO TABS
5.0000 mg | ORAL_TABLET | ORAL | Status: DC | PRN
  Administered 2014-06-03 – 2014-06-05 (×8): 5 mg via ORAL
  Filled 2014-06-03 (×8): qty 1

## 2014-06-03 MED ORDER — BUDESONIDE-FORMOTEROL FUMARATE 160-4.5 MCG/ACT IN AERO
2.0000 | INHALATION_SPRAY | Freq: Every day | RESPIRATORY_TRACT | Status: DC
  Administered 2014-06-04 – 2014-06-05 (×2): 2 via RESPIRATORY_TRACT
  Filled 2014-06-03: qty 6

## 2014-06-03 MED ORDER — PANTOPRAZOLE SODIUM 40 MG PO TBEC
40.0000 mg | DELAYED_RELEASE_TABLET | Freq: Every day | ORAL | Status: DC
  Administered 2014-06-03 – 2014-06-05 (×3): 40 mg via ORAL
  Filled 2014-06-03 (×3): qty 1

## 2014-06-03 MED ORDER — POLYETHYLENE GLYCOL 3350 17 G PO PACK
17.0000 g | PACK | Freq: Every day | ORAL | Status: DC
  Administered 2014-06-03 – 2014-06-04 (×2): 17 g via ORAL
  Filled 2014-06-03 (×3): qty 1

## 2014-06-03 MED ORDER — LORAZEPAM 2 MG/ML IJ SOLN
1.0000 mg | Freq: Once | INTRAMUSCULAR | Status: AC
  Administered 2014-06-03: 1 mg via INTRAVENOUS
  Filled 2014-06-03: qty 1

## 2014-06-03 MED ORDER — MORPHINE SULFATE 2 MG/ML IJ SOLN
2.0000 mg | Freq: Once | INTRAMUSCULAR | Status: AC
  Administered 2014-06-03: 2 mg via INTRAVENOUS
  Filled 2014-06-03: qty 1

## 2014-06-03 MED ORDER — DIPHENHYDRAMINE HCL 50 MG/ML IJ SOLN
25.0000 mg | Freq: Once | INTRAMUSCULAR | Status: AC
  Administered 2014-06-03: 25 mg via INTRAVENOUS
  Filled 2014-06-03: qty 1

## 2014-06-03 MED ORDER — SODIUM CHLORIDE 0.9 % IV SOLN
INTRAVENOUS | Status: AC
  Administered 2014-06-03 – 2014-06-04 (×2): via INTRAVENOUS

## 2014-06-03 NOTE — ED Notes (Signed)
Admit MD at bedside

## 2014-06-03 NOTE — Progress Notes (Signed)
Utilization review completed. Pranavi Aure, RN, BSN. 

## 2014-06-03 NOTE — Progress Notes (Addendum)
   Follow Up Note  Pt admitted earlier this morning.  Seen after arrived to floor.  Weakness is a little bit better, although persistent. He complains of persistent headache  Exam: CV: Regular rate and rhythm, S1-S2, 2/6 systolic ejection murmur Lungs: Clear to auscultation bilaterally Abd: Soft, obese, nontender, positive bowel sounds Ext: No clubbing or cyanosis, trace edema Neuro: Right sided ptosis, some mild subjective weakness in the right side, lower extremity greater than upper extremity  Present on Admission:  . acute CVA: Patient unable to get MRI secondary to pacemaker. Angiogram done, negative for acute infarct, however discussed with neurology and they felt strongly that brainstem infarct would account for patient's symptoms could be ptosis and could be missed on CT angiogram. Patient already on anticoagulation with eliquis, however given persistent cocaine use, likely this is from small vessel disease requiring risk factor modification. Physical and occupational therapy to see Right sided weakness . Cardiac pacemaker in situ . COPD (chronic obstructive pulmonary disease): Clear to auscultation bilaterally  . Hyperlipidemia . Paroxysmal atrial fibrillation: Status post pacemaker on chronic anticoagulation  . Sick sinus syndrome . Chronic diastolic heart failure: Stable. BNP normal Cocaine abuse: Patient counseled  . Morbid obesity: Patient meets criteria with BMI greater than 40 Tobacco abuse: Patient counseled

## 2014-06-03 NOTE — H&P (Addendum)
[facility] History and Physical  [name] ZOX:[id number] DOB: November 02, 1955 DOA: 06/03/2014  Referring physician: ER physician.  PCP: Dorrene German, MD   Chief Complaint: Right-sided weakness and difficulty opening right eye.  HPI: Dennis Brock is a 58 y.o. male with history of paroxysmal atrial fibrillation and sick sinus syndrome status post pacemaker placement and on apixaban, or substance abuse including cocaine, COPD, previous stroke, hypertension, hyperlipidemia presented the ER because of right upper and lower extremity weakness with difficulty opening the right eye with right-sided headache. Patient started developing weakness in the right upper and lower extremities with difficulty opening the right eye on 12:30 AM last night. Patient states he has been having headache on the right side last 5 days. Patient also had a fall 5 days ago but did not hit his head or lose consciousness. Prior to the onset of right-sided weakness and difficulty of the right patient had tremors of his lower jaw. In the ER patient had CT head done and was evaluated by neurologist Dr. Roseanne Reno. At this time patient has been admitted for further management and workup for stroke. Patient has any weakness of the left upper and lower extremities or difficulty talking or swallowing. Denies any fever chills chest pain. Has been having mild shortness of breath off and on. Admits to taking cocaine 10 days ago.    Review of Systems: As presented in the history of presenting illness, rest negative.  Past Medical History  Diagnosis Date  . Hypertension     amlodipine, lisinopril  . Stroke     was on aspirin before stroke. July 2012 in [place].  sent home aspirin.   Marland Kitchen Hyperlipidemia     zocor 40mg   . Sick sinus syndrome     s/p pacemaker, no defibrillator  . GERD (gastroesophageal reflux disease)   . Chronic pain syndrome   . Seasonal allergies   . Asthma   . Chronic kidney disease   . GERD  (gastroesophageal reflux disease)   . Arthritis   . COPD (chronic obstructive pulmonary disease)   . Lumbar disc disease    Past Surgical History  Procedure Laterality Date  . Pacemaker insertion    . Lumbar laminectomy      5 back surgeries  . Hand surgery      3 broken bones  . Bunionectomy      right foot   Social History:  reports that he quit smoking about 6 months ago. He does not have any smokeless tobacco history on file. He reports that he does not drink alcohol or use illicit drugs. Where does patient live  home. Can patient participate in ADLs?  Yes.  Allergies  Allergen Reactions  . Dilaudid [Hydromorphone] Itching    Family History:  Family History  Problem Relation Age of Onset  . Stroke      grandfather and uncle died of stroke  . Heart attack      uncle  . Cancer Father     throat  . Cancer - Other Father     throat  . Transient ischemic attack Mother     22 and doing well  . Heart disease Mother   . COPD Mother   . Hypertension Sister   . COPD Sister   . Hypertension Sister   . COPD Sister   . Hypertension Sister   . COPD Sister   . Hypertension Sister   . COPD Sister   . Hypertension Sister   . COPD  Sister       Prior to Admission medications   Medication Sig Start Date End Date Taking? Authorizing Provider  albuterol (PROVENTIL HFA;VENTOLIN HFA) 108 (90 BASE) MCG/ACT inhaler Inhale 2 puffs into the lungs 2 (two) times daily as needed for wheezing or shortness of breath.    Yes Historical Provider, MD  albuterol (PROVENTIL) (2.5 MG/3ML) 0.083% nebulizer solution Take 2.5 mg by nebulization 2 (two) times daily.    Yes Historical Provider, MD  amLODipine (NORVASC) 5 MG tablet Take 1 tablet (5 mg total) by mouth daily. 01/15/14  Yes Duke Salvia, MD  apixaban (ELIQUIS) 5 MG TABS tablet Take 5 mg by mouth 2 (two) times daily.   Yes Historical Provider, MD  atorvastatin (LIPITOR) 20 MG tablet Take 20 mg by mouth daily.   Yes Historical Provider,  MD  budesonide-formoterol (SYMBICORT) 160-4.5 MCG/ACT inhaler Inhale 2 puffs into the lungs daily.   Yes Historical Provider, MD  lisinopril (PRINIVIL,ZESTRIL) 10 MG tablet Take 10 mg by mouth daily.   Yes Historical Provider, MD  omeprazole (PRILOSEC) 20 MG capsule Take 20 mg by mouth daily.   Yes Historical Provider, MD  oxyCODONE (OXY IR/ROXICODONE) 5 MG immediate release tablet Take 5 mg by mouth every 4 (four) hours as needed for moderate pain.    Yes Historical Provider, MD  polyethylene glycol (MIRALAX / GLYCOLAX) packet Take 17 g by mouth daily.   Yes Historical Provider, MD    Physical Exam: Filed Vitals:   06/03/14 0430 06/03/14 0500 06/03/14 0530 06/03/14 0539  BP: 141/73 144/75 154/104 154/104  Pulse: 70 72 68 67  Temp:  98.3 F (36.8 C)    TempSrc:  Oral    Resp: 19 14 20    Height:      Weight:      SpO2: 90% 93% 93%      General:  Well-developed and nourished.  Eyes:  Anicteric no pallor. Patient finds it difficult to keep right eye open.  ENT:  No discharge from the ears eyes nose mouth.  Neck:  No mass felt.  Cardiovascular:  S1-S2 heard.  Respiratory:  No rhonchi or crepitations.  Abdomen:  Soft nontender bowel sounds present. No guarding or rigidity.  Skin:  No rash.  Musculoskeletal:  No edema.  Psychiatric:  Appears normal.  Neurologic:  Alert awake oriented to time place and person. Mild weakness of the right upper and lower extremity. Left upper extremity 5 x 5. Patient unable to keep open right eye. No facial asymmetry otherwise. No tongue deviation. PERRLA positive.   Labs on Admission:  Basic Metabolic Panel:  Recent Labs Lab 06/03/14 0236  NA 139  K 3.6*  CL 102  CO2 24  GLUCOSE 113*  BUN 8  CREATININE 1.36*  CALCIUM 9.2   Liver Function Tests:  Recent Labs Lab 06/03/14 0236  AST 18  ALT 17  ALKPHOS 112  BILITOT 0.4  PROT 7.4  ALBUMIN 4.1   No results found for this basename: LIPASE, AMYLASE,  in the last 168 hours No  results found for this basename: AMMONIA,  in the last 168 hours CBC:  Recent Labs Lab 06/03/14 0236  WBC 6.2  NEUTROABS 3.4  HGB 15.0  HCT 43.4  MCV 87.0  PLT 196   Cardiac Enzymes: No results found for this basename: CKTOTAL, CKMB, CKMBINDEX, TROPONINI,  in the last 168 hours  BNP (last 3 results)  Recent Labs  03/06/14 0948 03/08/14 1250 06/03/14 0236  PROBNP 148.5* 61.1  71.2   CBG: No results found for this basename: GLUCAP,  in the last 168 hours  Radiological Exams on Admission: Dg Chest 2 View  06/03/2014   CLINICAL DATA:  Left-sided chest tightness, shortness of breath and nausea since Saturday night, acute onset. Initial encounter.  EXAM: CHEST  2 VIEW  COMPARISON:  Chest radiograph performed 05/07/2014  FINDINGS: The lungs are well-aerated. Vascular congestion is noted, with mildly increased markings, which may reflect minimal interstitial edema. No pleural effusion or pneumothorax is seen.  The heart is borderline enlarged. A pacemaker is noted at the left chest wall, with leads ending at the right atrium and right ventricle. No acute osseous abnormalities are seen.  IMPRESSION: Vascular congestion and borderline cardiomegaly, with mildly increased interstitial markings, which may reflect minimal interstitial edema.   Electronically Signed   By: Roanna RaiderJeffery  Chang M.D.   On: 06/03/2014 03:28   Ct Head (brain) Wo Contrast  06/03/2014   CLINICAL DATA:  Acute onset of right-sided arm and leg numbness, and right-sided facial trembling. Severe diffuse headache. Initial encounter.  EXAM: CT HEAD WITHOUT CONTRAST  TECHNIQUE: Contiguous axial images were obtained from the base of the skull through the vertex without intravenous contrast.  COMPARISON:  CTA of the head performed 05/31/2012  FINDINGS: There is no evidence of acute infarction, mass lesion, or intra- or extra-axial hemorrhage on CT.  Chronic encephalomalacia is noted at the right mid corona radiata and right basal ganglia,  reflecting remote infarct. A smaller chronic lacunar infarct is noted at the left thalamus.  The posterior fossa, including the cerebellum, brainstem and fourth ventricle, is within normal limits. The third and lateral ventricles are unremarkable in appearance. No mass effect or midline shift is seen.  There is no evidence of fracture; visualized osseous structures are unremarkable in appearance. The orbits are within normal limits. The paranasal sinuses and mastoid air cells are well-aerated. No significant soft tissue abnormalities are seen.  IMPRESSION: 1. No acute intracranial abnormality seen on CT. 2. Chronic encephalomalacia at the right mid corona radiata and right basal ganglia, reflecting remote infarct. Small chronic lacunar infarct at the left thalamus.   Electronically Signed   By: Roanna RaiderJeffery  Chang M.D.   On: 06/03/2014 03:27    EKG: Independently reviewed.  sinus rhythm with PACs.  Assessment/Plan Principal Problem:   Right sided weakness Active Problems:   History of stroke   Hyperlipidemia   Sick sinus syndrome   Cardiac pacemaker in situ   COPD (chronic obstructive pulmonary disease)   Long-term (current) use of anticoagulants   Paroxysmal atrial fibrillation   1. Right-sided weakness with difficulty opening right eye and headache - at this time patient's symptoms are concerning for CVA. Since patient has pacemaker MRI would be unable to be done so CT angiogram of head and neck has been ordered. Swallow evaluation. Patient is on apixaban. Since patient has headache check sedimentation rate. Patient did receive Reglan Benadryl in the ER for headache. Patient's when necessary oxycodone chronically for pain. 2. Polysubstance abuse including cocaine and tobacco abuse - patient strongly advised to quit these habits. 3. Paroxysmal atrial fibrillation and sick sinus syndrome status post pacemaker placement on apixaban - rate controlled. On apixaban. Continue present  medications. 4. Hypertension - continue present medications. 5. Hyperlipidemia - on statins. 6. COPD - presently not wheezing.  Patient had complained of shortness of breath off and on and chest x-ray was showing mild congestion but BNP is within normal limits, follow 2-D echo  and respiratory status closely.    Code Status:  full code.  Family Communication:  none.  Disposition Plan:  admit to inpatient.    KAKRAKANDY,ARSHAD N. [facility] Pager [phone].  If 7PM-7AM, please contact night-coverage [email or web address] Password [email or web address] 06/03/2014, 6:04 AM

## 2014-06-03 NOTE — Progress Notes (Signed)
STROKE TEAM PROGRESS NOTE   HISTORY Ann HeldJerry Lee Dorow is an 58 y.o. male with a history of paroxysmal atrial fibrillation and sick sinus syndrome on Eliquis, hypertension and hyperlipidemia, as well as previous cerebral infarction, presenting with new onset drooping right eyelid as well as weakness and numbness of right arm and leg. Patient has a cardiac pacemaker. He admits to recent cocaine use. CT scan of his head showed old right corona radiata/basal ganglia infarction as well as old left thalamic infarction, but no acute abnormality. Patient had a number of other complaints including headache and chest pain. NIH stroke score was 2. He was last known well 12:30 AM on 06/03/2014. Patient was not administered TPA secondary to being on Eliquis and minimal deficits. He was admitted for further evaluation and treatment.   SUBJECTIVE (INTERVAL HISTORY) No family is at the bedside.  Overall he feels his condition is gradually worsening. He hit has persistent drooping of the right eyelid, dysarthria and mild right sided ataxia. He admits to cocaine use and states it is willing to stop   OBJECTIVE Temp:  [97.8 F (36.6 C)-98.4 F (36.9 C)] 98.1 F (36.7 C) (10/05 0957) Pulse Rate:  [62-81] 65 (10/05 0957) Cardiac Rhythm:  [-]  Resp:  [14-22] 22 (10/05 0957) BP: (134-161)/(59-104) 150/79 mmHg (10/05 0957) SpO2:  [89 %-97 %] 91 % (10/05 0957) Weight:  [133.539 kg (294 lb 6.4 oz)-136.533 kg (301 lb)] 133.539 kg (294 lb 6.4 oz) (10/05 0707)   Recent Labs Lab 06/03/14 0236 06/03/14 0724  NA 139 140  K 3.6* 3.4*  CL 102 104  CO2 24 26  GLUCOSE 113* 99  BUN 8 7  CREATININE 1.36* 1.36*  CALCIUM 9.2 8.9    Recent Labs Lab 06/03/14 0236 06/03/14 0724  AST 18 17  ALT 17 16  ALKPHOS 112 102  BILITOT 0.4 0.4  PROT 7.4 6.6  ALBUMIN 4.1 3.8    Recent Labs Lab 06/03/14 0236 06/03/14 0724  WBC 6.2 5.1  NEUTROABS 3.4 2.5  HGB 15.0 14.1  HCT 43.4 42.2  MCV 87.0 87.6  PLT 196 171     Recent Labs Lab 06/03/14 0724  TROPONINI <0.30    Recent Labs  06/03/14 0236  LABPROT 13.1  INR 0.99   No results found for this basename: COLORURINE, APPERANCEUR, LABSPEC, PHURINE, GLUCOSEU, HGBUR, BILIRUBINUR, KETONESUR, PROTEINUR, UROBILINOGEN, NITRITE, LEUKOCYTESUR,  in the last 72 hours     Component Value Date/Time   CHOL 174 06/01/2012 0615   TRIG 272* 06/01/2012 0615   HDL 50 06/01/2012 0615   CHOLHDL 3.5 06/01/2012 0615   VLDL 54* 06/01/2012 0615   LDLCALC 70 06/01/2012 0615   Lab Results  Component Value Date   HGBA1C 6.1* 02/21/2014      Component Value Date/Time   LABOPIA NONE DETECTED 06/03/2014 0429   COCAINSCRNUR POSITIVE* 06/03/2014 0429   LABBENZ NONE DETECTED 06/03/2014 0429   AMPHETMU NONE DETECTED 06/03/2014 0429   THCU NONE DETECTED 06/03/2014 0429   LABBARB NONE DETECTED 06/03/2014 0429    No results found for this basename: ETH,  in the last 168 hours  Dg Chest 2 View  06/03/2014   CLINICAL DATA:  Left-sided chest tightness, shortness of breath and nausea since Saturday night, acute onset. Initial encounter.  EXAM: CHEST  2 VIEW  COMPARISON:  Chest radiograph performed 05/07/2014  FINDINGS: The lungs are well-aerated. Vascular congestion is noted, with mildly increased markings, which may reflect minimal interstitial edema. No pleural effusion or pneumothorax  is seen.  The heart is borderline enlarged. A pacemaker is noted at the left chest wall, with leads ending at the right atrium and right ventricle. No acute osseous abnormalities are seen.  IMPRESSION: Vascular congestion and borderline cardiomegaly, with mildly increased interstitial markings, which may reflect minimal interstitial edema.   Electronically Signed   By: Roanna Raider M.D.   On: 06/03/2014 03:28   Ct Head (brain) Wo Contrast  06/03/2014   CLINICAL DATA:  Acute onset of right-sided arm and leg numbness, and right-sided facial trembling. Severe diffuse headache. Initial encounter.  EXAM: CT  HEAD WITHOUT CONTRAST  TECHNIQUE: Contiguous axial images were obtained from the base of the skull through the vertex without intravenous contrast.  COMPARISON:  CTA of the head performed 05/31/2012  FINDINGS: There is no evidence of acute infarction, mass lesion, or intra- or extra-axial hemorrhage on CT.  Chronic encephalomalacia is noted at the right mid corona radiata and right basal ganglia, reflecting remote infarct. A smaller chronic lacunar infarct is noted at the left thalamus.  The posterior fossa, including the cerebellum, brainstem and fourth ventricle, is within normal limits. The third and lateral ventricles are unremarkable in appearance. No mass effect or midline shift is seen.  There is no evidence of fracture; visualized osseous structures are unremarkable in appearance. The orbits are within normal limits. The paranasal sinuses and mastoid air cells are well-aerated. No significant soft tissue abnormalities are seen.  IMPRESSION: 1. No acute intracranial abnormality seen on CT. 2. Chronic encephalomalacia at the right mid corona radiata and right basal ganglia, reflecting remote infarct. Small chronic lacunar infarct at the left thalamus.   Electronically Signed   By: Roanna Raider M.D.   On: 06/03/2014 03:27    PHYSICAL EXAM Middle aged african american obese male not in distress.Awake alert. Afebrile. Head is nontraumatic. Neck is supple without bruit. Hearing is normal. Cardiac exam no murmur or gallop. Lungs are clear to auscultation. Distal pulses are well felt. Neurological Exam : Awake alert oriented x3 with normal speech and language function. Right eye his ptosis but equal pupillary size and reaction to light.No enopthalmos.subjective diminished sensation left face. Extraocular moments are full range without any disconjugate gaze or diplopia. Fundi were not visualized. Left lower facial weakness. Tongue midline. Motor system exam no upper or lower extremity drift. Fine finger  movements are symmetric. Mild right finger-to-nose dysmetria. Gait not tested due to subjective dizziness on standing. ASSESSMENT/PLAN Mr. Agustine Rossitto is a 58 y.o. male with history of paroxysmal atrial fibrillation and sick sinus syndrome on Eliquis, hypertension, hyperlipidemia and previous cerebral infarction, presenting with new onset drooping right eyelid, weakness and numbness of right arm and leg. CT  imaging does not confirm a   Infarct. But suspect right Pontine infarct on clinical findings and unable to obtain MRI due to pacemaker.   Stroke: - R brainstem/pontine infarct, likely due to small vessel disease given location with resultant R Horner's syndrome (R ptosis, R dysmetria)    - recrudescence of stroke symptoms (L hemiparesis) 05/2012 - old right basal ganglia and left thalamic infarcts with L hemiparesis 03/2012  MRI/MRA  - pacer  CT angio of head an neck  No large vessel occlusion  Carotid Doppler  See CT angio  2D Echo pending  Eliquis for VTE prophylaxis  Cardiac thin liquids.   Bedrest, OOB with assistance  Resultant R Horner's , dysarthria, mild right UE dysmetria   Eliquis prior to admission, now on Eliquis  Risk factor education  Patient counseled to be compliant with his antithrombotic medications & to quit cocaine use  Disposition:  Home likely  Paroxsymal atrial fibrillation  Dx May 2015, started On Eliquis Graciela Husbands)  Patient states he was compliant  Hypertension  Home meds:   Norvasc, lisinopril  Permissive hypertension <220/120 for 24-48 hours and then gradually normalize within 5-7 days  BP goal long term normotensive  Stable  Patient counseled to be compliant with his blood pressure medications  Hyperlipidemia  Home meds:  lipitor 20  LDL 70  Continued in hospital  Continue statin at discharge  Diabetes  HgbA1c 6.1 in June, at Goal < 7.0  Controlled  Other Stroke Risk Factors  Cocaine use  Cigarette smoker,  advised to stop smoking   Morbid Obesity, Body mass index is 39.92 kg/(m^2).    Hx stroke/TIA   Family hx stroke (grandfather, uncle)  obstructive sleep apnea, reports he has been diagnosed with a sleep study in [place] and again in [place], but that he could not tolerate the CPAP  Other Pertinent History  SSS with pacer  Chronic pain syndrome from multiple lumbar operations  CKD  Grade 1 CHF    Hospital day # 0 I have personally examined this patient, reviewed notes, independently viewed imaging studies, participated in medical decision making and plan of care. I have made any additions or clarifications directly to the above note. Agree with note above.    Delia Heady, MD Medical Director [facility] Pager: [phone] 06/03/2014 3:42 PM      To contact Stroke Continuity provider, please refer to [email or web address]. After hours, contact General Neurology

## 2014-06-03 NOTE — ED Notes (Signed)
Pt reports L sided chest tightness, sob, and nausea since Saturday night.  States he noticed R eye drooping around 7pm.  Reports headache, difficulty holding R eye open, R arm weakness, and R leg weakness that started at 12:30am while watching football game.  On arrival, no arm drift noted.  Pt able to hold legs up without assistance.  States he is still having chest pain, headache, sob, nausea, and problems keeping R eye open.  Pt is able to open R eye.  Dr. Norlene Campbelltter at triage.  No code stroke activation per Dr. Norlene Campbelltter.  Pt reports being under a lot of stress. Seen in ED 9/8 for chest pain.

## 2014-06-03 NOTE — ED Notes (Signed)
Attempted report 

## 2014-06-03 NOTE — ED Provider Notes (Signed)
CSN: [id number]     Arrival date & time 06/03/14  0153 History   First MD Initiated Contact with Patient 06/03/14 0231     Chief Complaint  Patient presents with  . Stroke Symptoms  . Chest Pain     (Consider location/radiation/quality/duration/timing/severity/associated sxs/prior Treatment) HPI 58 yo male presents to the ER from home with complaint of chest pain, headache, right arm and leg weakness, difficulties keeping right eye open.  Pt is a difficult historian, with changing times and sequence of symptoms.  He reports h/o stroke resulting in right eye droop and left arm/leg weakness. Pt with onset of chest pain on Saturday, reports two prior ED visits for chest pain, and "I got sent home both times"  Pt has local cardiologist, Dr Graciela HusbandsKlein, who reportedly told patient he has angina.  Chest pain is left sided, tightness feeling.  He reports sob, tightness in throat.  Pt reports around 7 pm he noticed difficulties with right eye-having problems keeping it open.  Around 1230 after watching football he noticed right arm and leg weakness.  No difficulties with ambulation.  Pt noted upon arrival to have rapid blinking of right eye.  Pt reports headache ongoing since Tuesday.  Pt reports cocaine use on Thursday.  He denies frequent cocaine usage.  Pt is on eliquis.    Past Medical History  Diagnosis Date  . Hypertension     amlodipine, lisinopril  . Stroke     was on aspirin before stroke. July 2012 [place].  sent home aspirin.   Marland Kitchen. Hyperlipidemia     zocor 40mg   . Sick sinus syndrome     s/p pacemaker, no defibrillator  . GERD (gastroesophageal reflux disease)   . Chronic pain syndrome   . Seasonal allergies   . Asthma   . Chronic kidney disease   . GERD (gastroesophageal reflux disease)   . Arthritis   . COPD (chronic obstructive pulmonary disease)   . Lumbar disc disease    Past Surgical History  Procedure Laterality Date  . Pacemaker insertion    . Lumbar laminectomy      5 back  surgeries  . Hand surgery      3 broken bones  . Bunionectomy      right foot   Family History  Problem Relation Age of Onset  . Stroke      grandfather and uncle died of stroke  . Heart attack      uncle  . Cancer Father     throat  . Cancer - Other Father     throat  . Transient ischemic attack Mother     6287 and doing well  . Heart disease Mother   . COPD Mother   . Hypertension Sister   . COPD Sister   . Hypertension Sister   . COPD Sister   . Hypertension Sister   . COPD Sister   . Hypertension Sister   . COPD Sister   . Hypertension Sister   . COPD Sister    History  Substance Use Topics  . Smoking status: Former Smoker    Quit date: 11/21/2013  . Smokeless tobacco: Not on file  . Alcohol Use: No    Review of Systems  All other systems reviewed and are negative. other than listed in hpi    Allergies  Dilaudid  Home Medications   Prior to Admission medications   Medication Sig Start Date End Date Taking? Authorizing Provider  albuterol (PROVENTIL HFA;VENTOLIN HFA)  108 (90 BASE) MCG/ACT inhaler Inhale 2 puffs into the lungs 2 (two) times daily as needed for wheezing or shortness of breath.     Historical Provider, MD  albuterol (PROVENTIL) (2.5 MG/3ML) 0.083% nebulizer solution Take 2.5 mg by nebulization 2 (two) times daily.     Historical Provider, MD  amLODipine (NORVASC) 5 MG tablet Take 1 tablet (5 mg total) by mouth daily. 01/15/14   Duke Salvia, MD  apixaban (ELIQUIS) 5 MG TABS tablet Take 5 mg by mouth 2 (two) times daily.    Historical Provider, MD  atorvastatin (LIPITOR) 20 MG tablet Take 20 mg by mouth daily.    Historical Provider, MD  budesonide-formoterol (SYMBICORT) 160-4.5 MCG/ACT inhaler Inhale 2 puffs into the lungs daily.    Historical Provider, MD  lisinopril (PRINIVIL,ZESTRIL) 10 MG tablet Take 10 mg by mouth daily.    Historical Provider, MD  omeprazole (PRILOSEC) 20 MG capsule Take 20 mg by mouth daily.    Historical Provider, MD   oxyCODONE (OXY IR/ROXICODONE) 5 MG immediate release tablet Take 5 mg by mouth every 12 (twelve) hours.    Historical Provider, MD  polyethylene glycol (MIRALAX / GLYCOLAX) packet Take 17 g by mouth daily.    Historical Provider, MD   BP 137/89  Pulse 73  Temp(Src) 97.8 F (36.6 C) (Oral)  Resp 20  Ht 6\' 1"  (1.854 m)  Wt 301 lb (136.533 kg)  BMI 39.72 kg/m2  SpO2 96% Physical Exam  Nursing note and vitals reviewed. Constitutional: He is oriented to person, place, and time. He appears well-developed and well-nourished.  Anxious appearing  HENT:  Head: Normocephalic and atraumatic.  Right Ear: External ear normal.  Left Ear: External ear normal.  Nose: Nose normal.  Mouth/Throat: Oropharynx is clear and moist.  Pt with frequent blinking/twitching of right eye, stops when patient is distracted  Eyes: Conjunctivae and EOM are normal. Pupils are equal, round, and reactive to light.  Reports photophobia to right eye  Neck: Normal range of motion. Neck supple. No JVD present. No tracheal deviation present. No thyromegaly present.  Cardiovascular: Normal rate, regular rhythm, normal heart sounds and intact distal pulses.  Exam reveals no gallop and no friction rub.   No murmur heard. Pulmonary/Chest: Effort normal and breath sounds normal. No stridor. No respiratory distress. He has no wheezes. He has no rales. He exhibits no tenderness.  Abdominal: Soft. Bowel sounds are normal. He exhibits no distension and no mass. There is no tenderness. There is no rebound and no guarding.  Musculoskeletal: Normal range of motion. He exhibits no edema and no tenderness.  Lymphadenopathy:    He has no cervical adenopathy.  Neurological: He is alert and oriented to person, place, and time. He displays normal reflexes. A cranial nerve deficit is present. He exhibits normal muscle tone. Coordination (finger nose finger not consistent) abnormal.  Pt has inconsistent exam-reports weakness in right arm,  leg, has decreased grip of right arm, but is able to reach across bed for phone on counter without difficulty.  Can lift right leg against gravity for count of 5, but light touch causes buckling.  No difficulties with gait, ambulation.  Normal smile, eyebrow raise during conversation, but when asked to smile or raise eyebrows has difficulty, holds right eye shut  Skin: Skin is warm and dry. No rash noted. No erythema. No pallor.  Psychiatric: He has a normal mood and affect. His behavior is normal. Judgment and thought content normal.    ED  Course  Procedures (including critical care time) Labs Review Labs Reviewed  COMPREHENSIVE METABOLIC PANEL - Abnormal; Notable for the following:    Potassium 3.6 (*)    Glucose, Bld 113 (*)    Creatinine, Ser 1.36 (*)    GFR calc non Af Amer 56 (*)    GFR calc Af Amer 65 (*)    All other components within normal limits  PRO B NATRIURETIC PEPTIDE  PROTIME-INR  APTT  CBC  DIFFERENTIAL  URINE RAPID DRUG SCREEN (HOSP PERFORMED)  I-STAT TROPOININ, ED  I-STAT TROPOININ, ED    Imaging Review Dg Chest 2 View  06/03/2014   CLINICAL DATA:  Left-sided chest tightness, shortness of breath and nausea since Saturday night, acute onset. Initial encounter.  EXAM: CHEST  2 VIEW  COMPARISON:  Chest radiograph performed 05/07/2014  FINDINGS: The lungs are well-aerated. Vascular congestion is noted, with mildly increased markings, which may reflect minimal interstitial edema. No pleural effusion or pneumothorax is seen.  The heart is borderline enlarged. A pacemaker is noted at the left chest wall, with leads ending at the right atrium and right ventricle. No acute osseous abnormalities are seen.  IMPRESSION: Vascular congestion and borderline cardiomegaly, with mildly increased interstitial markings, which may reflect minimal interstitial edema.   Electronically Signed   By: Roanna Raider M.D.   On: 06/03/2014 03:28   Ct Head (brain) Wo Contrast  06/03/2014    CLINICAL DATA:  Acute onset of right-sided arm and leg numbness, and right-sided facial trembling. Severe diffuse headache. Initial encounter.  EXAM: CT HEAD WITHOUT CONTRAST  TECHNIQUE: Contiguous axial images were obtained from the base of the skull through the vertex without intravenous contrast.  COMPARISON:  CTA of the head performed 05/31/2012  FINDINGS: There is no evidence of acute infarction, mass lesion, or intra- or extra-axial hemorrhage on CT.  Chronic encephalomalacia is noted at the right mid corona radiata and right basal ganglia, reflecting remote infarct. A smaller chronic lacunar infarct is noted at the left thalamus.  The posterior fossa, including the cerebellum, brainstem and fourth ventricle, is within normal limits. The third and lateral ventricles are unremarkable in appearance. No mass effect or midline shift is seen.  There is no evidence of fracture; visualized osseous structures are unremarkable in appearance. The orbits are within normal limits. The paranasal sinuses and mastoid air cells are well-aerated. No significant soft tissue abnormalities are seen.  IMPRESSION: 1. No acute intracranial abnormality seen on CT. 2. Chronic encephalomalacia at the right mid corona radiata and right basal ganglia, reflecting remote infarct. Small chronic lacunar infarct at the left thalamus.   Electronically Signed   By: Roanna Raider M.D.   On: 06/03/2014 03:27     EKG Interpretation   Date/Time:  Monday June 03 2014 02:01:38 EDT Ventricular Rate:  89 PR Interval:  168 QRS Duration: 96 QT Interval:  396 QTC Calculation: 481 R Axis:   67 Text Interpretation:  Sinus rhythm with Premature atrial complexes  Prolonged QT Abnormal ECG Artifact Confirmed by Savier Trickett  MD, Aerin Delany ([id number]) on  06/03/2014 2:37:28 AM      MDM   Final diagnoses:  Chest pain, unspecified chest pain type  Right sided weakness    58 yo male with recent cocaine use, h/o DM, HTN, CVA, Sick Sinus with pacemaker  on eliquis with 4 days of headache, 2 days of chest pain, and right eye droop, weakness of right side.  Code stroke not called given inconsistency of history and  exam, pt on eliquis.  Will consult neurology for their evaluation.   5:48 AM Pt has been seen by neurology, who recommends admission for further workup, PT evaluation.  Hospitalist to admit.    Olivia Mackie, MD 06/03/14 [phone]

## 2014-06-03 NOTE — Consult Note (Addendum)
Referring Physician: Dr. Norlene Campbelltter    Chief Complaint: Drooping right eyelid and numbness and weakness involving right side.  HPI: Dennis Brock is an 58 y.o. male with a history of paroxysmal atrial fibrillation and sick sinus syndrome on Eliquis, hypertension and hyperlipidemia, as well as previous cerebral infarction, presenting with new onset drooping right eyelid as well as weakness and numbness of right arm and leg. Patient has a cardiac pacemaker. He admits to recent cocaine use. CT scan of his head showed old right corona radiata/basal ganglia infarction as well as old left thalamic infarction, but no acute abnormality. Patient had a number of other complaints including headache and chest pain. NIH stroke score was 2.   LSN: 12:30 AM on 06/03/2014 tPA Given: No: On Eliquis; minimal deficits mRankin:  Past Medical History  Diagnosis Date  . Hypertension     amlodipine, lisinopril  . Stroke     was on aspirin before stroke. July [place]  sent home aspirin.   Marland Kitchen. Hyperlipidemia     zocor 40mg   . Sick sinus syndrome     s/p pacemaker, no defibrillator  . GERD (gastroesophageal reflux disease)   . Chronic pain syndrome   . Seasonal allergies   . Asthma   . Chronic kidney disease   . GERD (gastroesophageal reflux disease)   . Arthritis   . COPD (chronic obstructive pulmonary disease)   . Lumbar disc disease     Family History  Problem Relation Age of Onset  . Stroke      grandfather and uncle died of stroke  . Heart attack      uncle  . Cancer Father     throat  . Cancer - Other Father     throat  . Transient ischemic attack Mother     [id number] and doing well  . Heart disease Mother   . COPD Mother   . Hypertension Sister   . COPD Sister   . Hypertension Sister   . COPD Sister   . Hypertension Sister   . COPD Sister   . Hypertension Sister   . COPD Sister   . Hypertension Sister   . COPD Sister      Medications: I have reviewed the patient's current  medications.  ROS: History obtained from chart review and the patient  General ROS: negative for - chills, fatigue, fever, night sweats, weight gain or weight loss Psychological ROS: negative for - behavioral disorder, hallucinations, memory difficulties, mood swings or suicidal ideation Ophthalmic ROS: negative for - blurry vision, double vision, eye pain or loss of vision ENT ROS: negative for - epistaxis, nasal discharge, oral lesions, sore throat, tinnitus or vertigo Allergy and Immunology ROS: negative for - hives or itchy/watery eyes Hematological and Lymphatic ROS: negative for - bleeding problems, bruising or swollen lymph nodes Endocrine ROS: negative for - galactorrhea, hair pattern changes, polydipsia/polyuria or temperature intolerance Respiratory ROS: negative for - cough, hemoptysis, shortness of breath or wheezing Cardiovascular ROS: Chest pain as noted in history of present illness Gastrointestinal ROS: negative for - abdominal pain, diarrhea, hematemesis, nausea/vomiting or stool incontinence Genito-Urinary ROS: negative for - dysuria, hematuria, incontinence or urinary frequency/urgency Musculoskeletal ROS: negative for - joint swelling or muscular weakness Neurological ROS: as noted in HPI Dermatological ROS: negative for rash and skin lesion changes  Physical Examination: Blood pressure 144/75, pulse 73, temperature 98.3 F (36.8 C), temperature source Oral, resp. rate 23, height 6\' 1"  (1.854 m), weight 136.533 kg (301 lb),  SpO2 90.00%.  Neurologic Examination: Mental Status: Alert, oriented, thought content appropriate.  Speech fluent without evidence of aphasia. Able to follow commands without difficulty. Cranial Nerves: II-Visual fields were normal. III/IV/VI-Pupils were equal and reacted. Extraocular movements were full and conjugate. Patient had intermittent drooping right eyelid which abated when distracted.  V/VII-reduced perception of tactile sensation over  the right side of the face compared to left; no facial weakness. VIII-normal. X-normal speech and symmetrical palatal movement. Motor: Minimal drift of right lower extremity which was questionably volitional; motor exam is otherwise unremarkable. Sensory: Reduced perception tactile sensation over right extremities compared to left extremities.. Deep Tendon Reflexes: 1+ and symmetric. Plantars: Flexor bilaterally Cerebellar: Normal finger-to-nose testing.  Dg Chest 2 View  06/03/2014   CLINICAL DATA:  Left-sided chest tightness, shortness of breath and nausea since Saturday night, acute onset. Initial encounter.  EXAM: CHEST  2 VIEW  COMPARISON:  Chest radiograph performed 05/07/2014  FINDINGS: The lungs are well-aerated. Vascular congestion is noted, with mildly increased markings, which may reflect minimal interstitial edema. No pleural effusion or pneumothorax is seen.  The heart is borderline enlarged. A pacemaker is noted at the left chest wall, with leads ending at the right atrium and right ventricle. No acute osseous abnormalities are seen.  IMPRESSION: Vascular congestion and borderline cardiomegaly, with mildly increased interstitial markings, which may reflect minimal interstitial edema.   Electronically Signed   By: Roanna Raider M.D.   On: 06/03/2014 03:28   Ct Head (brain) Wo Contrast  06/03/2014   CLINICAL DATA:  Acute onset of right-sided arm and leg numbness, and right-sided facial trembling. Severe diffuse headache. Initial encounter.  EXAM: CT HEAD WITHOUT CONTRAST  TECHNIQUE: Contiguous axial images were obtained from the base of the skull through the vertex without intravenous contrast.  COMPARISON:  CTA of the head performed 05/31/2012  FINDINGS: There is no evidence of acute infarction, mass lesion, or intra- or extra-axial hemorrhage on CT.  Chronic encephalomalacia is noted at the right mid corona radiata and right basal ganglia, reflecting remote infarct. A smaller chronic  lacunar infarct is noted at the left thalamus.  The posterior fossa, including the cerebellum, brainstem and fourth ventricle, is within normal limits. The third and lateral ventricles are unremarkable in appearance. No mass effect or midline shift is seen.  There is no evidence of fracture; visualized osseous structures are unremarkable in appearance. The orbits are within normal limits. The paranasal sinuses and mastoid air cells are well-aerated. No significant soft tissue abnormalities are seen.  IMPRESSION: 1. No acute intracranial abnormality seen on CT. 2. Chronic encephalomalacia at the right mid corona radiata and right basal ganglia, reflecting remote infarct. Small chronic lacunar infarct at the left thalamus.   Electronically Signed   By: Roanna Raider M.D.   On: 06/03/2014 03:27    Assessment: 58 y.o. male with multiple risk factors for stroke as well as previous cerebral infarctions, presenting with new onset of focal neurologic complaints with significant functional overlay. TIA or recurrent stroke cannot be ruled out, however. Unfortunately, patient cannot have MRI because of pacemaker placement.  Stroke Risk Factors - family history, hyperlipidemia and hypertension  Plan: 1. HgbA1c, fasting lipid panel 2. CT angiogram of head and neck with contrast 3. PT consult 4. Echocardiogram 5. Prophylactic therapy-Anticoagulation: Eliquis 6. Risk factor modification 7. Telemetry monitoring   C.R. Roseanne Reno, MD [facility] [phone]  06/03/2014, 5:02 AM

## 2014-06-03 NOTE — Progress Notes (Signed)
  Echocardiogram 2D Echocardiogram has been performed.  Dennis Brock 06/03/2014, 12:30 PM

## 2014-06-04 DIAGNOSIS — G4733 Obstructive sleep apnea (adult) (pediatric): Secondary | ICD-10-CM | POA: Diagnosis present

## 2014-06-04 LAB — LIPID PANEL
Cholesterol: 140 mg/dL (ref 0–200)
HDL: 68 mg/dL (ref >39)
LDL Cholesterol: 46 mg/dL (ref 0–99)
Total CHOL/HDL Ratio: 2.1 RATIO
Triglycerides: 129 mg/dL (ref <150)
VLDL: 26 mg/dL (ref 0–40)

## 2014-06-04 LAB — HEMOGLOBIN A1C
Hgb A1c MFr Bld: 6 % — ABNORMAL HIGH (ref <5.7)
Mean Plasma Glucose: 126 mg/dL — ABNORMAL HIGH (ref <117)

## 2014-06-04 MED ORDER — METOPROLOL TARTRATE 12.5 MG HALF TABLET
12.5000 mg | ORAL_TABLET | Freq: Two times a day (BID) | ORAL | Status: DC
  Administered 2014-06-04 – 2014-06-05 (×3): 12.5 mg via ORAL
  Filled 2014-06-04 (×4): qty 1

## 2014-06-04 MED ORDER — METOPROLOL TARTRATE 12.5 MG HALF TABLET: 12.5000 mg | ORAL_TABLET | Freq: Two times a day (BID) | ORAL | Status: DC

## 2014-06-04 NOTE — Progress Notes (Signed)
STROKE TEAM PROGRESS NOTE   HISTORY Dennis Brock is an 58 y.o. male with a history of paroxysmal atrial fibrillation and sick sinus syndrome on Eliquis, hypertension and hyperlipidemia, as well as previous cerebral infarction, presenting with new onset drooping right eyelid as well as weakness and numbness of right arm and leg. Patient has a cardiac pacemaker. He admits to recent cocaine use. CT scan of his head showed old right corona radiata/basal ganglia infarction as well as old left thalamic infarction, but no acute abnormality. Patient had a number of other complaints including headache and chest pain. NIH stroke score was 2. He was last known well 12:30 AM on 06/03/2014. Patient was not administered TPA secondary to being on Eliquis and minimal deficits. He was admitted for further evaluation and treatment.   SUBJECTIVE (INTERVAL HISTORY) Wife is at the bedside.  Overall he feels his condition is gradually improving. He  has improved drooping of the right eyelid, dysarthria and mild right sided ataxia. He admits to cocaine use and states it is willing to stop. He wants to go home. He refuses CPAP for his OSA.   OBJECTIVE Temp:  [97.8 F (36.6 C)-98.1 F (36.7 C)] 98 F (36.7 C) (10/06 1300) Pulse Rate:  [64-81] 78 (10/06 1300) Cardiac Rhythm:  [-]  Resp:  [16-18] 16 (10/06 1300) BP: (122-142)/(65-95) 142/91 mmHg (10/06 1300) SpO2:  [95 %-98 %] 95 % (10/06 1300) FiO2 (%):  [2 %] 2 % (10/05 2000) Weight:  [295 lb 4.8 oz (133.947 kg)] 295 lb 4.8 oz (133.947 kg) (10/06 0500)   Recent Labs Lab 06/03/14 0236 06/03/14 0724  NA 139 140  K 3.6* 3.4*  CL 102 104  CO2 24 26  GLUCOSE 113* 99  BUN 8 7  CREATININE 1.36* 1.36*  CALCIUM 9.2 8.9    Recent Labs Lab 06/03/14 0236 06/03/14 0724  AST 18 17  ALT 17 16  ALKPHOS 112 102  BILITOT 0.4 0.4  PROT 7.4 6.6  ALBUMIN 4.1 3.8    Recent Labs Lab 06/03/14 0236 06/03/14 0724  WBC 6.2 5.1  NEUTROABS 3.4 2.5  HGB 15.0  14.1  HCT 43.4 42.2  MCV 87.0 87.6  PLT 196 171    Recent Labs Lab 06/03/14 0724  TROPONINI <0.30    Recent Labs  06/03/14 0236  LABPROT 13.1  INR 0.99   No results found for this basename: COLORURINE, APPERANCEUR, LABSPEC, PHURINE, GLUCOSEU, HGBUR, BILIRUBINUR, KETONESUR, PROTEINUR, UROBILINOGEN, NITRITE, LEUKOCYTESUR,  in the last 72 hours     Component Value Date/Time   CHOL 140 06/04/2014 0510   TRIG 129 06/04/2014 0510   HDL 68 06/04/2014 0510   CHOLHDL 2.1 06/04/2014 0510   VLDL 26 06/04/2014 0510   LDLCALC 46 06/04/2014 0510   Lab Results  Component Value Date   HGBA1C 6.0* 06/04/2014      Component Value Date/Time   LABOPIA NONE DETECTED 06/03/2014 0429   COCAINSCRNUR POSITIVE* 06/03/2014 0429   LABBENZ NONE DETECTED 06/03/2014 0429   AMPHETMU NONE DETECTED 06/03/2014 0429   THCU NONE DETECTED 06/03/2014 0429   LABBARB NONE DETECTED 06/03/2014 0429    No results found for this basename: ETH,  in the last 168 hours  Ct Angio Head W/cm &/or Wo Cm  06/03/2014   CLINICAL DATA:  RIGHT-sided weakness, severe headache and extreme sleepiness.  EXAM: CT ANGIOGRAPHY HEAD AND NECK  TECHNIQUE: Multidetector CT imaging of the head and neck was performed using the standard protocol during bolus administration of  intravenous contrast. Multiplanar CT image reconstructions and MIPs were obtained to evaluate the vascular anatomy. Carotid stenosis measurements (when applicable) are obtained utilizing NASCET criteria, using the distal internal carotid diameter as the denominator.  CONTRAST:  80mL OMNIPAQUE IOHEXOL 350 MG/ML SOLN  COMPARISON:  CT head earlier today  FINDINGS: CTA HEAD FINDINGS  Dolichoectatic but widely patent internal carotid arteries and basilar artery. Both vertebrals contribute to the formation of the basilar and are also widely patent with the LEFT dominant. No visible intracranial stenosis or branch occlusion. Post infusion, no abnormal enhancement. Unchanged RIGHT deep white  matter lenticulostriate infarct.  Review of the MIP images confirms the above findings.  CTA NECK FINDINGS  Abundant mediastinal fat associated with increased body habitus. No pulmonary lesion or mediastinal mass is evident. Conventional branching of the great vessels in the arch without visible proximal stenosis; considerable artifact in the superior mediastinum from contrast and pacemaker. No visible neck mass. Dolichoectatic but widely patent internal carotid arteries without significant atheromatous change at the bifurcation or carotid dissection. Both vertebrals are widely patent through the neck, again the LEFT vertebral slightly larger without ostial lesion.  Review of the MIP images confirms the above findings.  IMPRESSION: No extracranial or intracranial stenosis or occlusion is evident. Moderate dolichoectasia. Chronic RIGHT deep white matter lenticulostriate infarct is stable.   Electronically Signed   By: Davonna BellingJohn  Curnes M.D.   On: 06/03/2014 10:17   Dg Chest 2 View  06/03/2014   CLINICAL DATA:  Left-sided chest tightness, shortness of breath and nausea since Saturday night, acute onset. Initial encounter.  EXAM: CHEST  2 VIEW  COMPARISON:  Chest radiograph performed 05/07/2014  FINDINGS: The lungs are well-aerated. Vascular congestion is noted, with mildly increased markings, which may reflect minimal interstitial edema. No pleural effusion or pneumothorax is seen.  The heart is borderline enlarged. A pacemaker is noted at the left chest wall, with leads ending at the right atrium and right ventricle. No acute osseous abnormalities are seen.  IMPRESSION: Vascular congestion and borderline cardiomegaly, with mildly increased interstitial markings, which may reflect minimal interstitial edema.   Electronically Signed   By: Roanna RaiderJeffery  Chang M.D.   On: 06/03/2014 03:28   Ct Head (brain) Wo Contrast  06/03/2014   CLINICAL DATA:  Acute onset of right-sided arm and leg numbness, and right-sided facial  trembling. Severe diffuse headache. Initial encounter.  EXAM: CT HEAD WITHOUT CONTRAST  TECHNIQUE: Contiguous axial images were obtained from the base of the skull through the vertex without intravenous contrast.  COMPARISON:  CTA of the head performed 05/31/2012  FINDINGS: There is no evidence of acute infarction, mass lesion, or intra- or extra-axial hemorrhage on CT.  Chronic encephalomalacia is noted at the right mid corona radiata and right basal ganglia, reflecting remote infarct. A smaller chronic lacunar infarct is noted at the left thalamus.  The posterior fossa, including the cerebellum, brainstem and fourth ventricle, is within normal limits. The third and lateral ventricles are unremarkable in appearance. No mass effect or midline shift is seen.  There is no evidence of fracture; visualized osseous structures are unremarkable in appearance. The orbits are within normal limits. The paranasal sinuses and mastoid air cells are well-aerated. No significant soft tissue abnormalities are seen.  IMPRESSION: 1. No acute intracranial abnormality seen on CT. 2. Chronic encephalomalacia at the right mid corona radiata and right basal ganglia, reflecting remote infarct. Small chronic lacunar infarct at the left thalamus.   Electronically Signed   By: Leotis ShamesJeffery  Chang M.D.   On: 06/03/2014 03:27   Ct Angio Neck W/cm &/or Wo/cm  06/03/2014   CLINICAL DATA:  RIGHT-sided weakness, severe headache and extreme sleepiness.  EXAM: CT ANGIOGRAPHY HEAD AND NECK  TECHNIQUE: Multidetector CT imaging of the head and neck was performed using the standard protocol during bolus administration of intravenous contrast. Multiplanar CT image reconstructions and MIPs were obtained to evaluate the vascular anatomy. Carotid stenosis measurements (when applicable) are obtained utilizing NASCET criteria, using the distal internal carotid diameter as the denominator.  CONTRAST:  80mL OMNIPAQUE IOHEXOL 350 MG/ML SOLN  COMPARISON:  CT head  earlier today  FINDINGS: CTA HEAD FINDINGS  Dolichoectatic but widely patent internal carotid arteries and basilar artery. Both vertebrals contribute to the formation of the basilar and are also widely patent with the LEFT dominant. No visible intracranial stenosis or branch occlusion. Post infusion, no abnormal enhancement. Unchanged RIGHT deep white matter lenticulostriate infarct.  Review of the MIP images confirms the above findings.  CTA NECK FINDINGS  Abundant mediastinal fat associated with increased body habitus. No pulmonary lesion or mediastinal mass is evident. Conventional branching of the great vessels in the arch without visible proximal stenosis; considerable artifact in the superior mediastinum from contrast and pacemaker. No visible neck mass. Dolichoectatic but widely patent internal carotid arteries without significant atheromatous change at the bifurcation or carotid dissection. Both vertebrals are widely patent through the neck, again the LEFT vertebral slightly larger without ostial lesion.  Review of the MIP images confirms the above findings.  IMPRESSION: No extracranial or intracranial stenosis or occlusion is evident. Moderate dolichoectasia. Chronic RIGHT deep white matter lenticulostriate infarct is stable.   Electronically Signed   By: Davonna Belling M.D.   On: 06/03/2014 10:17    PHYSICAL EXAM Middle aged african american obese male not in distress.Awake alert. Afebrile. Head is nontraumatic. Neck is supple without bruit. Hearing is normal. Cardiac exam no murmur or gallop. Lungs are clear to auscultation. Distal pulses are well felt. Neurological Exam : Awake alert oriented x3 with normal speech and language function. Right eye his ptosis but equal pupillary size and reaction to light.No enopthalmos.subjective diminished sensation left face. Extraocular moments are full range without any disconjugate gaze or diplopia. Fundi were not visualized. Left lower facial weakness. Tongue  midline. Motor system exam no upper or lower extremity drift. Fine finger movements are symmetric. Mild right finger-to-nose dysmetria. Gait not tested due to subjective dizziness on standing. ASSESSMENT/PLAN Mr. Deaunte Dente is a 58 y.o. male with history of paroxysmal atrial fibrillation and sick sinus syndrome on Eliquis, hypertension, hyperlipidemia and previous cerebral infarction, presenting with new onset drooping right eyelid, weakness and numbness of right arm and leg. CT  imaging does not confirm a   Infarct. But suspect right Pontine infarct on clinical findings and unable to obtain MRI due to pacemaker.   Stroke: - R brainstem/pontine infarct, likely due to small vessel disease given location with resultant R Horner's syndrome (R ptosis, R dysmetria)    - recrudescence of stroke symptoms (L hemiparesis) 05/2012 - old right basal ganglia and left thalamic infarcts with L hemiparesis 03/2012  MRI/MRA  - pacer  CT angio of head an neck  No large vessel occlusion  Carotid Doppler  See CT angio  2D Echo pending  Eliquis for VTE prophylaxis  Cardiac thin liquids.   Bedrest, OOB with assistance  Resultant R Horner's , dysarthria, mild right UE dysmetria   Eliquis prior to admission, now on  Eliquis   Risk factor education  Patient counseled to be compliant with his antithrombotic medications & to quit cocaine use  Disposition:  Home likely  Paroxsymal atrial fibrillation  Dx May 2015, started On Eliquis Graciela Husbands)  Patient states he was compliant  Hypertension  Home meds:   Norvasc, lisinopril  Permissive hypertension <220/120 for 24-48 hours and then gradually normalize within 5-7 days  BP goal long term normotensive  Stable  Patient counseled to be compliant with his blood pressure medications  Hyperlipidemia  Home meds:  lipitor 20  LDL 70  Continued in hospital  Continue statin at discharge  Diabetes  HgbA1c 6.1 in June, at Goal <  7.0  Controlled  Other Stroke Risk Factors  Cocaine use  Cigarette smoker, advised to stop smoking   Morbid Obesity, Body mass index is 40.04 kg/(m^2).    Hx stroke/TIA   Family hx stroke (grandfather, uncle)  obstructive sleep apnea, reports he has been diagnosed with a sleep study in [place] and again in [place], but that he could not tolerate the CPAP  Other Pertinent History  SSS with pacer  Chronic pain syndrome from multiple lumbar operations  CKD  Grade 1 CHF    Hospital day # 1 I have personally examined this patient, reviewed notes, independently viewed imaging studies, participated in medical decision making and plan of care. I have made any additions or clarifications directly to the above note. Agree with note above.   Stroke Team will sign off. Call for questions. F/U as outpatient stroke clinic 4 weeks. Delia Heady, MD Medical Director [facility] Pager: [phone] 06/04/2014 2:35 PM      To contact Stroke Continuity provider, please refer to [email or web address]. After hours, contact General Neurology

## 2014-06-04 NOTE — Progress Notes (Signed)
Noted OT eval. [facility] Not in need of an intp rehab admission at this level. [phone]

## 2014-06-04 NOTE — Evaluation (Signed)
Occupational Therapy Evaluation Patient Details Name: Dennis HeldJerry Lee Brock MRN: [id number] DOB: April 21, 1956 Today's Date: 06/04/2014    History of Present Illness Dennis HeldJerry Lee Halseth is a 58 y.o. male with history of paroxysmal atrial fibrillation and sick sinus syndrome status post pacemaker placement, substance abuse including cocaine, COPD, previous stroke, HTN, HLD presented the ER because of right upper and lower extremity weakness.  CT shows chronic changes of deep white matter on R, stable lenticulostriate infarct, no acute occlusions or infarcts.   Clinical Impression   Patient admitted with above. Patient independent PTA. Patient currently functioning at an overall supervision level. Please see OT problem list below. Feel patient will benefit from acute OT to increase overall independence in the areas of ADLs & functional mobility and in order to safely discharge to venue listed below. Patient presents with impulsive and unsafe behaviors. Patient is unsafe with the AE/DME. Through clinical judgement, I believe patient will benefit from 24/7 supervision/assistance at home and additional HHOT.      Follow Up Recommendations  Home health OT;Supervision/Assistance - 24 hour    Equipment Recommendations   (none at this time, please defer to next venue of care)    Recommendations for Other Services  (none at this time)     Precautions / Restrictions Precautions Precautions: None Precaution Comments: Easily agitated and irritable  Restrictions Weight Bearing Restrictions: No Other Position/Activity Restrictions: Old back surgeries with chronic stiffness      Mobility Bed Mobility Overal bed mobility: Modified Independent  Transfers Overall transfer level: Needs assistance Equipment used: Rolling walker (2 wheeled) Transfers: Sit to/from [employer] CorporationStand;Stand Pivot Transfers Sit to Stand: Supervision Stand pivot transfers: Supervision    Balance  Defer to PT evaluation     ADL Overall  ADL's : Needs assistance/impaired     Grooming: Supervision/safety;Standing;Cueing for safety   Upper Body Bathing: Sitting;Cueing for safety;Supervision/ safety   Lower Body Bathing: Supervison/ safety;Sit to/from stand;Cueing for safety   Upper Body Dressing : Supervision/safety;Sitting;Cueing for safety   Lower Body Dressing: Supervision/safety;Cueing for safety;Sit to/from stand   Toilet Transfer: Supervision/safety;Cueing for safety;Ambulation   Toileting- Clothing Manipulation and Hygiene: Supervision/safety;Cueing for safety;Sit to/from stand   Tub/ Shower Transfer: Min guard;Cueing for safety;Tub transfer     General ADL Comments: Patient found ambulating up and down hallways. Patient presents with increased impulsivity during initial OT evaluation. Patient with unsafe RW awareness and decreased overall safey awareness. This therapist attempted to educate, but patient somewhat resistant to education at this time. Patient will benefit from acute OT needs to improve overall safey in order to d/c home for additional HHOT. Patient's sister and girlfriend present halfway through session and patient with decreased agitation.      Vision Additional Comments: Patient with decreased right eye ROM and blurring of vision in R eye Vision to be further tested in functional setting. Patient with complaints of eye fatigue, blurring of vision, and decreased peripheral vision.    Perception Perception Perception Tested?: No Comments: none noted during inital OT eval   Praxis Praxis Praxis tested?: Within functional limits    Pertinent Vitals/Pain Pain Assessment: 0-10 Pain Score: 8  Pain Location:  (head and back of leg) Pain Intervention(s): Monitored during session;Repositioned     Hand Dominance Right   Extremity/Trunk Assessment Upper Extremity Assessment Upper Extremity Assessment: Generalized weakness   Lower Extremity Assessment Lower Extremity Assessment: Defer to PT  evaluation   Cervical / Trunk Assessment Cervical / Trunk Assessment: Normal   Communication Communication Communication: No  difficulties   Cognition Arousal/Alertness: Awake/alert Behavior During Therapy: Agitated;Anxious;Restless;Impulsive Overall Cognitive Status: Difficult to assess             Home Living Family/patient expects to be discharged to:: Private residence Living Arrangements: Non-relatives/Friends Available Help at Discharge: Family;Friend(s) (sister and girlfriend (lives about an hour away)) Type of Home: House Home Access: Stairs to enter Secretary/administrator of Steps: 3 Entrance Stairs-Rails: Right Home Layout: One level     Bathroom Shower/Tub: Tub/shower unit;Curtain Shower/tub characteristics: Engineer, building services: Standard Bathroom Accessibility: Yes How Accessible: Accessible via walker Home Equipment: None   Additional Comments: Was working and driving until recently      Prior Functioning/Environment Level of Independence: Independent     OT Diagnosis: Generalized weakness;Disturbance of vision;Acute pain   OT Problem List: Impaired balance (sitting and/or standing);Impaired vision/perception;Decreased safety awareness;Decreased knowledge of use of DME or AE;Pain   OT Treatment/Interventions: Self-care/ADL training;Therapeutic exercise;Energy conservation;DME and/or AE instruction;Visual/perceptual remediation/compensation;Patient/family education;Balance training    OT Goals(Current goals can be found in the care plan section) Acute Rehab OT Goals Patient Stated Goal: To get home again OT Goal Formulation: With patient/family Time For Goal Achievement: 06/18/14 Potential to Achieve Goals: Good ADL Goals Pt Will Perform Grooming: Independently;standing Pt Will Perform Upper Body Bathing: Independently;standing Pt Will Perform Lower Body Bathing: Independently;sit to/from stand Pt Will Perform Upper Body Dressing:  Independently;standing Pt Will Perform Lower Body Dressing: Independently;sit to/from stand Pt Will Transfer to Toilet: Independently;ambulating;regular height toilet Pt Will Perform Tub/Shower Transfer: Independently;ambulating Additional ADL Goal #1: Patient will be independent with a visual/eye HEP to improve ROM, decrease pain in eye, and limitations secondary to fatigue  OT Frequency: Min 2X/week   Barriers to D/C: Decreased caregiver support  Patient reports he lives with a roommate. Patient's sister lives close and this therapist recommended sister provide supervision. Patient's girlfriend lives ~ an hour away          End of Session Activity Tolerance: Patient tolerated treatment well;Treatment limited secondary to agitation Patient left: in bed;with family/visitor present   Time: 1310-1340 OT Time Calculation (min): 30 min Charges:  OT General Charges $OT Visit: 1 Procedure OT Evaluation $Initial OT Evaluation Tier I: 1 Procedure OT Treatments $Self Care/Home Management : 8-22 mins G-Codes:    Yury Schaus , MS, OTR/L, CLT 06/04/2014, 2:05 PM

## 2014-06-04 NOTE — Evaluation (Signed)
Speech Language Pathology Evaluation Patient Details Name: Dennis Brock MRN: [id number] DOB: 1955/11/01 Today's Date: 06/04/2014 Time: 1515- 1600    Problem List:  Patient Active Problem List   Diagnosis Date Noted  . OSA (obstructive sleep apnea) 06/04/2014  . Acute CVA (cerebrovascular accident) 06/03/2014  . Right sided weakness 06/03/2014  . Chronic diastolic heart failure 06/03/2014  . Cocaine abuse 06/03/2014  . Tobacco abuse 06/03/2014  . Chest pain 02/21/2014  . Cardiac pacemaker in situ 02/21/2014  . Morbid obesity   . Lumbar disc disease   . COPD (chronic obstructive pulmonary disease)   . Long-term (current) use of anticoagulants   . Paroxysmal atrial fibrillation   . Hypertensive heart disease   . History of stroke   . Hyperlipidemia   . Sick sinus syndrome   . GERD (gastroesophageal reflux disease)   . Chronic pain syndrome    Past Medical History:  Past Medical History  Diagnosis Date  . Hypertension     amlodipine, lisinopril  . Stroke     was on aspirin before stroke. July 2012 in [place].  sent home aspirin.   Marland Kitchen Hyperlipidemia     zocor 40mg   . Sick sinus syndrome     s/p pacemaker, no defibrillator  . GERD (gastroesophageal reflux disease)   . Chronic pain syndrome   . Seasonal allergies   . Asthma   . Chronic kidney disease   . GERD (gastroesophageal reflux disease)   . Arthritis   . COPD (chronic obstructive pulmonary disease)   . Lumbar disc disease   . Pacemaker    Past Surgical History:  Past Surgical History  Procedure Laterality Date  . Pacemaker insertion    . Lumbar laminectomy      5 back surgeries  . Hand surgery      3 broken bones  . Bunionectomy      right foot   HPI:  Dennis Brock is a 58 y.o. male with history of paroxysmal atrial fibrillation and sick sinus syndrome status post pacemaker placement and on apixaban, or substance abuse including cocaine, COPD, previous stroke, hypertension, hyperlipidemia presented the  ER because of right upper and lower extremity weakness with difficulty opening the right eye with right-sided headache. Patient started developing weakness in the right upper and lower extremities with difficulty opening the right eye on 12:30 AM last night. Patient states he has been having headache on the right side last 5 days. Patient also had a fall 5 days ago but did not hit his head or lose consciousness. Prior to the onset of right-sided weakness and difficulty of the right patient had tremors of his lower jaw. In the ER patient had CT head done and was evaluated by neurologist Dr. Roseanne Reno. At this time patient has been admitted for further management and workup for stroke. Patient has any weakness of the left upper and lower extremities or difficulty talking or swallowing. Denies any fever chills chest pain. Has been having mild shortness of breath off and on. Admits to taking cocaine 10 days ago.     Assessment / Plan / Recommendation Clinical Impression  Patient currently appears to be at baseline level of function for speech/language/cognition and swallowing.  No dysarthria noted at this time.  Pt and girlfriend are upset re: d/c and need to make modifications at his apartment ("He can't modifiy what he doesn't own.  He's renting.")  Also upset regarding f/u needs for supervision.  Pt's girlfriend is on disability and  sister's work and have families.  Provided supportive listening and directed questions to social services.    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 [place]   Cognition  Overall Cognitive Status: Within Functional Limits for tasks assessed Arousal/Alertness: Awake/alert Orientation Level: Oriented X4 Attention: Alternating Alternating Attention: Appears intact Memory: Appears intact Awareness: Appears intact Problem Solving: Appears intact Behaviors: Restless;Poor frustration tolerance Safety/Judgment: Appears intact    Comprehension  Auditory Comprehension Overall Auditory Comprehension: Appears within functional limits for tasks assessed Yes/No Questions: Within Functional Limits Commands: Within Functional Limits Conversation: Complex Visual Recognition/Discrimination Discrimination: Not tested Reading Comprehension Reading Status: Not tested    Expression Expression Primary Mode of Expression: Verbal Verbal Expression Overall Verbal Expression: Appears within functional limits for tasks assessed Initiation: No impairment Level of Generative/Spontaneous Verbalization: Conversation Repetition: No impairment Naming: No impairment Pragmatics: No impairment Interfering Components:  (Financial concerns) Written Expression Dominant Hand: Right Written Expression: Not tested   Oral / Motor Oral Motor/Sensory Function Overall Oral Motor/Sensory Function: Appears within functional limits for tasks assessed Motor Speech Overall Motor Speech: Appears within functional limits for tasks assessed Respiration: Within functional limits Phonation: Normal Resonance: Within functional limits Articulation: Within functional limitis Intelligibility: Intelligible Motor Planning: Witnin functional limits   GO     Maryjo RochesterWillis, Novak Stgermaine T 06/04/2014, 4:02 PM

## 2014-06-04 NOTE — Discharge Summary (Addendum)
Discharge Summary  Dennis MageJerry Lee Brock MVH:[id number] DOB: 08-Apr-1956  PCP: Dorrene GermanAVBUERE,EDWIN A, MD  Admit date: 06/03/2014 anticipatedDischarge date: 06/05/2014  Time spent: 25 minutes  Recommendations for Outpatient Follow-up:  1. Physical therapy followup inpatient rehabilitation versus home health-to be determined 2. New medications: Metoprolol 12.5 mg by mouth twice a day 3. Patient will followup with pulmonary in regards to sleep apnea 4. Patient will followup with neurology in one month   Discharge Diagnoses:  Active Hospital Problems   Diagnosis Date Noted  . Acute CVA (cerebrovascular accident) 06/03/2014  . Right sided weakness 06/03/2014  . Chronic diastolic heart failure 06/03/2014  . Cocaine abuse 06/03/2014  . Tobacco abuse 06/03/2014  . Cardiac pacemaker in situ 02/21/2014  . COPD (chronic obstructive pulmonary disease)   . Long-term (current) use of anticoagulants   . Paroxysmal atrial fibrillation   . Morbid obesity   . History of stroke   . Hyperlipidemia   . Sick sinus syndrome     Resolved Hospital Problems   Diagnosis Date Noted Date Resolved  No resolved problems to display.    Discharge Condition: improved  Diet recommendation: heart healthy  Filed Weights   06/03/14 0222 06/03/14 0707 06/04/14 0500  Weight: 136.533 kg (301 lb) 133.539 kg (294 lb 6.4 oz) 133.947 kg (295 lb 4.8 oz)    History of present illness:  58 year old male with past medical history of obstructive sleep apnea noncompliant with CPAP, previous stroke COPD, A. Fib status post pacemaker on chronic anticoagulation and drug use who presented to the emergency room on 10/5 early morning hours after waking up with right-sided upper and lower from the weakness plus headache and difficulty opening her right eye.  He is cocaine 10 days prior.  Hospital Course:  Principal Problem:   Acute CVA (cerebrovascular accident):because of pacemaker, unable to get MRI.  Underwent CT angiogram which  was unrevealing for acute CVA, however patient suspected to have posterior circulation stroke on right side which may not necessarily show up on CT scan given. Given patient's overall presentation, signs and was factors, it is almost certainly this and he had an acute infarction. Workup unrevealing for an embolic event. Patient on eliquis already. He likely has multiple risk factors which led to this including obesity, hypertension, untreated sleep apnea and cocaine abuse.  While his ptosis has improved, right-sided weakness persisting. Awaiting further physical therapy and inpatient rehabilitation evaluation to determine if he'll be discharged to inpatient rehabilitation versus home with home this will therapy. Active Problems:   Hyperlipidemia: patient already on statin. LDL checked at 46    Cardiac pacemaker in situin the setting of sick sinus syndrome and paroxysmal A. Fib. As above, patient not candidate for MRI.    Morbid obesity: Patient is criteria would BMI greater than 40.    COPD (chronic obstructive pulmonary disease)   Long-term (current) use of anticoagulants: As outlined above, will continue on eliquis.    Right sided weakness: Secondary to stroke. Persisting although somewhat improved. Seen by PT today will recommend inpatient rehabilitation. History of fevers waiting an additional day to see if he progresses and can be sent home with home health PT and OT    Chronic diastolic heart failure:  By confirmed by echo. Grade 2 diastolic dysfunction.  BNP stable at 71  Obstructive sleep apnea: Patient states he was diagnosed with this 5 years ago but is not able tolerate the mask. Have instructed him that his untreated sleep apnea alone could cause another stroke.  Also advised him that in the last 5 years, mask have changed in nature and he may be more tolerant of this. Setting up patient with appointment with pulmonary.    Cocaine abuse: Counseled. He says that he will quit.    Tobacco  abuse: Counseled. Patient states that he will quit.  Procedures:  2-D echo done 10/5: Grade 2 diastolic dysfunction  Consultations:  neurology  Discharge Exam: BP 122/65  Pulse 81  Temp(Src) 97.9 F (36.6 C) (Oral)  Resp 18  Ht 6' (1.829 m)  Wt 133.947 kg (295 lb 4.8 oz)  BMI 40.04 kg/m2  SpO2 96%  General: alert and oriented x3, no acute distress Cardiovascular: regular rate and rhythm, S1-S2, soft 2/6 systolic ejection murmur Respiratory: auscultation bilaterally  Discharge Instructions You were cared for by a hospitalist during your hospital stay. If you have any questions about your discharge medications or the care you received while you were in the hospital after you are discharged, you can call the unit and asked to speak with the hospitalist on call if the hospitalist that took care of you is not available. Once you are discharged, your primary care physician will handle any further medical issues. Please note that NO REFILLS for any discharge medications will be authorized once you are discharged, as it is imperative that you return to your primary care physician (or establish a relationship with a primary care physician if you do not have one) for your aftercare needs so that they can reassess your need for medications and monitor your lab values.     Medication List         albuterol 108 (90 BASE) MCG/ACT inhaler  Commonly known as:  PROVENTIL HFA;VENTOLIN HFA  Inhale 2 puffs into the lungs 2 (two) times daily as needed for wheezing or shortness of breath.     albuterol (2.5 MG/3ML) 0.083% nebulizer solution  Commonly known as:  PROVENTIL  Take 2.5 mg by nebulization 2 (two) times daily.     amLODipine 5 MG tablet  Commonly known as:  NORVASC  Take 1 tablet (5 mg total) by mouth daily.     atorvastatin 20 MG tablet  Commonly known as:  LIPITOR  Take 20 mg by mouth daily.     budesonide-formoterol 160-4.5 MCG/ACT inhaler  Commonly known as:  SYMBICORT    Inhale 2 puffs into the lungs daily.     ELIQUIS 5 MG Tabs tablet  Generic drug:  apixaban  Take 5 mg by mouth 2 (two) times daily.     lisinopril 10 MG tablet  Commonly known as:  PRINIVIL,ZESTRIL  Take 10 mg by mouth daily.     metoprolol tartrate 12.5 mg Tabs tablet  Commonly known as:  LOPRESSOR  Take 0.5 tablets (12.5 mg total) by mouth 2 (two) times daily.     omeprazole 20 MG capsule  Commonly known as:  PRILOSEC  Take 20 mg by mouth daily.     oxyCODONE 5 MG immediate release tablet  Commonly known as:  Oxy IR/ROXICODONE  Take 5 mg by mouth every 4 (four) hours as needed for moderate pain.     polyethylene glycol packet  Commonly known as:  MIRALAX / GLYCOLAX  Take 17 g by mouth daily.       Allergies  Allergen Reactions  . Dilaudid [Hydromorphone] Itching      The results of significant diagnostics from this hospitalization (including imaging, microbiology, ancillary and laboratory) are listed below for reference.  Significant Diagnostic Studies: Dg Chest 2 View  06/03/2014     IMPRESSION: Vascular congestion and borderline cardiomegaly, with mildly increased interstitial markings, which may reflect minimal interstitial edema.   Electronically Signed   By: Roanna Raider M.D.   On: 06/03/2014 03:28    Ct Head (brain) Wo Contrast  06/03/2014     IMPRESSION: 1. No acute intracranial abnormality seen on CT. 2. Chronic encephalomalacia at the right mid corona radiata and right basal ganglia, reflecting remote infarct. Small chronic lacunar infarct at the left thalamus.   Electronically Signed   By: Roanna Raider M.D.   On: 06/03/2014 03:27   Ct Angio Neck W/cm &/or Wo/cm Ct Angio Head W/cm &/or Wo Cm  06/03/2014     IMPRESSION: No extracranial or intracranial stenosis or occlusion is evident. Moderate dolichoectasia. Chronic RIGHT deep white matter lenticulostriate infarct is stable.   Electronically Signed   By: Davonna Belling M.D.   On: 06/03/2014 10:17     Microbiology: No results found for this or any previous visit (from the past 240 hour(s)).   Labs: Basic Metabolic Panel:  Recent Labs Lab 06/03/14 0236 06/03/14 0724  NA 139 140  K 3.6* 3.4*  CL 102 104  CO2 24 26  GLUCOSE 113* 99  BUN 8 7  CREATININE 1.36* 1.36*  CALCIUM 9.2 8.9   Liver Function Tests:  Recent Labs Lab 06/03/14 0236 06/03/14 0724  AST 18 17  ALT 17 16  ALKPHOS 112 102  BILITOT 0.4 0.4  PROT 7.4 6.6  ALBUMIN 4.1 3.8   No results found for this basename: LIPASE, AMYLASE,  in the last 168 hours No results found for this basename: AMMONIA,  in the last 168 hours CBC:  Recent Labs Lab 06/03/14 0236 06/03/14 0724  WBC 6.2 5.1  NEUTROABS 3.4 2.5  HGB 15.0 14.1  HCT 43.4 42.2  MCV 87.0 87.6  PLT 196 171   Cardiac Enzymes:  Recent Labs Lab 06/03/14 0724  TROPONINI <0.30   BNP: BNP (last 3 results)  Recent Labs  03/06/14 0948 03/08/14 1250 06/03/14 0236  PROBNP 148.5* 61.1 71.2   CBG: No results found for this basename: GLUCAP,  in the last 168 hours     Signed:  Hollice Espy  [facility] 06/04/2014, 12:27 PM

## 2014-06-04 NOTE — Plan of Care (Signed)
Problem: Acute Rehab PT Goals(only PT should resolve) Goal: Pt Will Ambulate Level surfaces only

## 2014-06-04 NOTE — Progress Notes (Signed)
Rehab Admissions Coordinator Note:  Patient was screened by Clois DupesBoyette, Patsey Pitstick Godwin for appropriateness for an Inpatient Acute Rehab Consult per PT recommendation.  At this time, we are recommending like to await OT eval and progress over the next 24 hrs before requestiing inpt rehab consultation. May be able to progress to go directly home. Clois Dupes.  Cline Draheim Godwin 06/04/2014, 11:29 AM  I can be reached at [phone].

## 2014-06-04 NOTE — Evaluation (Signed)
Physical Therapy Evaluation Patient Details Name: Dennis Brock MRN: [id number] DOB: 11-05-1955 Today's Date: 06/04/2014   History of Present Illness  Dennis Brock is a 58 y.o. male with history of paroxysmal atrial fibrillation and sick sinus syndrome status post pacemaker placement, substance abuse including cocaine, COPD, previous stroke, HTN, HLD presented the ER because of right upper and lower extremity weakness.  CT shows chronic changes of deep white matter on R, stable lenticulostriate infarct, no acute occlusions or infarcts.  Clinical Impression  Pt is appropriate for CIR recommendation but is somewhat anxious about the idea.  He agrees as he is aware of the risk of a fall with incomplete recovery, also impeded by his RLE cramping of hamstrings.  Nsg is aware and will follow up.    Follow Up Recommendations CIR;Supervision/Assistance - 24 hour    Equipment Recommendations  None recommended by PT    Recommendations for Other Services Rehab consult     Precautions / Restrictions Precautions Precautions: None Precaution Comments: Easily agitated and irritable  Restrictions Weight Bearing Restrictions: No Other Position/Activity Restrictions: Old back surgeries with chronic stiffness      Mobility  Bed Mobility Overal bed mobility: Modified Independent                Transfers Overall transfer level: Needs assistance Equipment used: Rolling walker (2 wheeled) Transfers: Sit to/from [employer] Sit to Stand: Min guard Stand pivot transfers: Min guard       General transfer comment: Impulsive and unsafe initially due to wanting to stand and don his pants, angry at being asked to sit although PT had tried to avoid having him stand initially until session ready to go  Ambulation/Gait Ambulation/Gait assistance: Min guard Ambulation Distance (Feet): 40 Feet Assistive device: Rolling walker (2 wheeled) Gait Pattern/deviations: Step-to  pattern;Decreased step length - right;Decreased stance time - right;Decreased dorsiflexion - right;Ataxic;Wide base of support Gait velocity: slow Gait velocity interpretation: Below normal speed for age/gender    Stairs            Wheelchair Mobility    Modified Rankin (Stroke Patients Only) Modified Rankin (Stroke Patients Only) Pre-Morbid Rankin Score: No significant disability Modified Rankin: Slight disability     Balance Overall balance assessment: Needs assistance Sitting-balance support: Single extremity supported Sitting balance-Leahy Scale: Good Sitting balance - Comments: Pt wants to lean over and unsafely reach to floor when PT had not assessed balance yet Postural control: Posterior lean Standing balance support: Bilateral upper extremity supported Standing balance-Leahy Scale: Fair Standing balance comment: Pt wanted to stand and don pants unsafely from standing                              Pertinent Vitals/Pain Pain Assessment: 0-10 Pain Score: 10-Worst pain ever Pain Location: R hamstrings Pain Descriptors / Indicators: Cramping Pain Intervention(s): Limited activity within patient's tolerance;Monitored during session;Patient requesting pain meds-RN notified;Repositioned BP was 122/65, pulse 81 and sat 96% per nsg notes.    Home Living Family/patient expects to be discharged to:: Private residence Living Arrangements: Non-relatives/Friends Available Help at Discharge: Friend(s) Type of Home: House Home Access: Stairs to enter Entrance Stairs-Rails: Right Entrance Stairs-Number of Steps: 3 Home Layout: One level Home Equipment: None Additional Comments: Was working and driving until recently    Prior Function Level of Independence: Independent               Hand Dominance  Dominant Hand: Right    Extremity/Trunk Assessment   Upper Extremity Assessment: Overall WFL for tasks assessed           Lower Extremity  Assessment: RLE deficits/detail RLE Deficits / Details: Unable to fully stand on RLE, complaining of hamstrings cramping and standing only on toes during gait    Cervical / Trunk Assessment: Normal  Communication   Communication: No difficulties  Cognition Arousal/Alertness: Awake/alert Behavior During Therapy: Agitated;Anxious;Restless;Impulsive Overall Cognitive Status: Difficult to assess                      General Comments General comments (skin integrity, edema, etc.): Pt has his significant other with him who is managing his behavior with outbursts by stopping and talking with him sternly.  Pt is listening to her at times but still easily agitiated    Exercises        Assessment/Plan    PT Assessment Patient needs continued PT services  PT Diagnosis Abnormality of gait   PT Problem List Decreased range of motion;Decreased activity tolerance;Decreased balance;Decreased mobility;Decreased coordination;Decreased safety awareness;Obesity;Pain  PT Treatment Interventions DME instruction;Gait training;Functional mobility training;Therapeutic activities;Therapeutic exercise;Balance training;Neuromuscular re-education;Patient/family education   PT Goals (Current goals can be found in the Care Plan section) Acute Rehab PT Goals Patient Stated Goal: To get home again PT Goal Formulation: With patient Time For Goal Achievement: 06/18/14 Potential to Achieve Goals: Good    Frequency Min 3X/week   Barriers to discharge Inaccessible home environment Pain in RLE is very limiting for activity but nsg is now aware    Co-evaluation               End of Session Equipment Utilized During Treatment: Gait belt Activity Tolerance: Patient limited by pain Patient left: in chair;with call bell/phone within reach;with family/visitor present Nurse Communication: Mobility status;Other (comment) (Concern for DVT in RLE)         Time: 1025-1056 PT Time Calculation (min):  31 min   Charges:   PT Evaluation $Initial PT Evaluation Tier I: 1 Procedure PT Treatments $Gait Training: 8-22 mins   PT G Codes:          Ivar DrapeStout, Masayuki Sakai E 06/04/2014, 11:21 AM Samul Dadauth Jaire Pinkham, PT MS Acute Rehab Dept. Number: [phone]

## 2014-06-05 MED ORDER — MAGNESIUM SULFATE 40 MG/ML IJ SOLN
2.0000 g | Freq: Once | INTRAMUSCULAR | Status: DC
  Filled 2014-06-05 (×2): qty 50

## 2014-06-05 MED ORDER — LISINOPRIL-HYDROCHLOROTHIAZIDE 10-12.5 MG PO TABS: 1.0000 | ORAL_TABLET | Freq: Every day | ORAL | Status: DC

## 2014-06-05 MED ORDER — OXYCODONE HCL 5 MG PO TABS: 5.0000 mg | ORAL_TABLET | ORAL | Status: DC | PRN

## 2014-06-05 MED ORDER — PROMETHAZINE HCL 25 MG/ML IJ SOLN: 12.5000 mg | Freq: Four times a day (QID) | INTRAMUSCULAR | Status: DC | PRN

## 2014-06-05 NOTE — Progress Notes (Signed)
CSW Proofreader(Clinical Social Worker) spoke with pt and pt family multiple times. They were provided with available resources and notified of who to contact for more information on alternative housing. Pt frustrated that CSW cannot assist further. CSW offered support. Pt family thankful for what has been provided.   Mikhail Hallenbeck, LCSWA [phone]

## 2014-06-05 NOTE — Care Management Note (Signed)
    Page 1 of 1   06/05/2014     10:50:45 AM CARE MANAGEMENT NOTE 06/05/2014  Patient:  Ann HeldCHAVIS,Mildred LEE   Account Number:  [id number]  Date Initiated:  06/05/2014  Documentation initiated by:  GRAVES-BIGELOW,Tyrrell Stephens  Subjective/Objective Assessment:   Pt admitted for Right-sided weakness. Pt lives with a roommate.     Action/Plan:   CM did speak with pt in regards to disposition needs. Per pt he will not need any services. He is walking fine. CM did ask CSW to see pt in regards to resources for housing. Eliquis card given to pt. No further needs from CM at this time.   Anticipated DC Date:  06/05/2014   Anticipated DC Plan:  HOME/SELF CARE  In-house referral  Clinical Social Worker      DC Planning Services  CM consult      Choice offered to / List presented to:             Status of service:  Completed, signed off Medicare Important Message given?  NA - LOS <3 / Initial given by admissions (If response is "NO", the following Medicare IM given date fields will be blank) Date Medicare IM given:   Medicare IM given by:   Date Additional Medicare IM given:   Additional Medicare IM given by:    Discharge Disposition:  HOME/SELF CARE  Per UR Regulation:  Reviewed for med. necessity/level of care/duration of stay  If discussed at Long Length of Stay Meetings, dates discussed:    Comments:

## 2014-06-05 NOTE — Progress Notes (Signed)
Pt and family concerned about discharge arrangements. Family concerned that patient will not be safe at home or be able to have everything that he needs installed at home as he does not own his home. Family wanting to speak with case Production designer, theatre/television/filmmanager and social worker about placement.

## 2014-06-25 ENCOUNTER — Encounter (HOSPITAL_COMMUNITY): Payer: Self-pay | Admitting: Emergency Medicine

## 2014-06-25 ENCOUNTER — Emergency Department (HOSPITAL_COMMUNITY)
Admission: EM | Admit: 2014-06-25 | Discharge: 2014-06-25 | Disposition: A | Discharge status: Home / Self Care | Payer: Medicare Other | Attending: Emergency Medicine | Admitting: Emergency Medicine

## 2014-06-25 ENCOUNTER — Emergency Department (HOSPITAL_COMMUNITY): Payer: Medicare Other

## 2014-06-25 DIAGNOSIS — R6883 Chills (without fever): Secondary | ICD-10-CM | POA: Diagnosis not present

## 2014-06-25 DIAGNOSIS — Z720 Tobacco use: Secondary | ICD-10-CM | POA: Diagnosis not present

## 2014-06-25 DIAGNOSIS — G894 Chronic pain syndrome: Secondary | ICD-10-CM | POA: Insufficient documentation

## 2014-06-25 DIAGNOSIS — R0602 Shortness of breath: Secondary | ICD-10-CM

## 2014-06-25 DIAGNOSIS — Z79899 Other long term (current) drug therapy: Secondary | ICD-10-CM | POA: Insufficient documentation

## 2014-06-25 DIAGNOSIS — E785 Hyperlipidemia, unspecified: Secondary | ICD-10-CM | POA: Insufficient documentation

## 2014-06-25 DIAGNOSIS — K219 Gastro-esophageal reflux disease without esophagitis: Secondary | ICD-10-CM | POA: Insufficient documentation

## 2014-06-25 DIAGNOSIS — Z8739 Personal history of other diseases of the musculoskeletal system and connective tissue: Secondary | ICD-10-CM | POA: Insufficient documentation

## 2014-06-25 DIAGNOSIS — Z8673 Personal history of transient ischemic attack (TIA), and cerebral infarction without residual deficits: Secondary | ICD-10-CM | POA: Diagnosis not present

## 2014-06-25 DIAGNOSIS — R51 Headache: Secondary | ICD-10-CM | POA: Diagnosis not present

## 2014-06-25 DIAGNOSIS — Z950 Presence of cardiac pacemaker: Secondary | ICD-10-CM | POA: Insufficient documentation

## 2014-06-25 DIAGNOSIS — I129 Hypertensive chronic kidney disease with stage 1 through stage 4 chronic kidney disease, or unspecified chronic kidney disease: Secondary | ICD-10-CM | POA: Insufficient documentation

## 2014-06-25 DIAGNOSIS — J441 Chronic obstructive pulmonary disease with (acute) exacerbation: Secondary | ICD-10-CM | POA: Diagnosis not present

## 2014-06-25 DIAGNOSIS — Z7952 Long term (current) use of systemic steroids: Secondary | ICD-10-CM | POA: Diagnosis not present

## 2014-06-25 DIAGNOSIS — N189 Chronic kidney disease, unspecified: Secondary | ICD-10-CM | POA: Diagnosis not present

## 2014-06-25 LAB — CBC WITH DIFFERENTIAL/PLATELET
BASOS PCT: 0 % (ref 0–1)
Basophils Absolute: 0 10*3/uL (ref 0.0–0.1)
EOS ABS: 0.2 10*3/uL (ref 0.0–0.7)
EOS PCT: 3 % (ref 0–5)
HEMATOCRIT: 43.6 % (ref 39.0–52.0)
Hemoglobin: 14.7 g/dL (ref 13.0–17.0)
LYMPHS ABS: 2.2 10*3/uL (ref 0.7–4.0)
Lymphocytes Relative: 34 % (ref 12–46)
MCH: 29.5 pg (ref 26.0–34.0)
MCHC: 33.7 g/dL (ref 30.0–36.0)
MCV: 87.4 fL (ref 78.0–100.0)
Monocytes Absolute: 0.6 10*3/uL (ref 0.1–1.0)
Monocytes Relative: 9 % (ref 3–12)
Neutro Abs: 3.5 10*3/uL (ref 1.7–7.7)
Neutrophils Relative %: 54 % (ref 43–77)
Platelets: 188 10*3/uL (ref 150–400)
RBC: 4.99 MIL/uL (ref 4.22–5.81)
RDW: 14.1 % (ref 11.5–15.5)
WBC: 6.5 10*3/uL (ref 4.0–10.5)

## 2014-06-25 LAB — BASIC METABOLIC PANEL
Anion gap: 11 (ref 5–15)
BUN: 12 mg/dL (ref 6–23)
CALCIUM: 9.3 mg/dL (ref 8.4–10.5)
CO2: 27 mEq/L (ref 19–32)
Chloride: 103 mEq/L (ref 96–112)
Creatinine, Ser: 1.29 mg/dL (ref 0.50–1.35)
GFR, EST AFRICAN AMERICAN: 70 mL/min — AB (ref >90)
GFR, EST NON AFRICAN AMERICAN: 60 mL/min — AB (ref >90)
GLUCOSE: 90 mg/dL (ref 70–99)
Potassium: 3.8 mEq/L (ref 3.7–5.3)
Sodium: 141 mEq/L (ref 137–147)

## 2014-06-25 LAB — PRO B NATRIURETIC PEPTIDE: Pro B Natriuretic peptide (BNP): 158.4 pg/mL — ABNORMAL HIGH (ref 0–125)

## 2014-06-25 MED ORDER — DOXYCYCLINE HYCLATE 100 MG PO CAPS: 100.0000 mg | ORAL_CAPSULE | Freq: Two times a day (BID) | ORAL | Status: DC

## 2014-06-25 MED ORDER — PREDNISONE 20 MG PO TABS
60.0000 mg | ORAL_TABLET | Freq: Once | ORAL | Status: AC
  Administered 2014-06-25: 60 mg via ORAL
  Filled 2014-06-25: qty 3

## 2014-06-25 MED ORDER — IPRATROPIUM-ALBUTEROL 0.5-2.5 (3) MG/3ML IN SOLN
3.0000 mL | Freq: Once | RESPIRATORY_TRACT | Status: AC
  Administered 2014-06-25: 3 mL via RESPIRATORY_TRACT
  Filled 2014-06-25: qty 3

## 2014-06-25 MED ORDER — IPRATROPIUM-ALBUTEROL 0.5-2.5 (3) MG/3ML IN SOLN
3.0000 mL | Freq: Once | RESPIRATORY_TRACT | Status: AC
Start: 2014-06-25 — End: 2014-06-25
  Administered 2014-06-25: 3 mL via RESPIRATORY_TRACT
  Filled 2014-06-25: qty 3

## 2014-06-25 MED ORDER — PREDNISONE 20 MG PO TABS: 60.0000 mg | ORAL_TABLET | Freq: Every day | ORAL | Status: DC

## 2014-06-25 NOTE — Discharge Instructions (Signed)

## 2014-06-25 NOTE — ED Provider Notes (Signed)
CSN: [id number]     Arrival date & time 06/25/14  0101 History   First MD Initiated Contact with Patient 06/25/14 0252     Chief Complaint  Patient presents with  . URI     (Consider location/radiation/quality/duration/timing/severity/associated sxs/prior Treatment) HPI  This is a [age] year old male with history of hypertension, stroke, COPD who presents with cough, chest congestion, chills. Patient reports 2 days of symptoms. Patient reports cough productive of yellow sputum. He reports associated chest pain and shortness of breath with cough. Has a history of COPD but states that his nebulizers at home do not seem to be helping. He has not received a flu shot this year. Patient reports that he was admitted 2 weeks ago for a "stroke." He continues to report headaches following this. Denies any abdominal pain, nausea, vomiting.  Past Medical History  Diagnosis Date  . Hypertension     amlodipine, lisinopril  . Stroke     was [place] aspirin before stroke[place]  [place] aspirin.   Marland Kitchen. Hyperlipidemia     zocor 40mg   . Sick sinus syndrome     s/p pacemaker, no defibrillator  . GERD (gastroesophageal reflux disease)   . Chronic pain syndrome   . Seasonal allergies   . Asthma   . Chronic kidney disease   . GERD (gastroesophageal reflux disease)   . Arthritis   . COPD (chronic obstructive pulmonary disease)   . Lumbar disc disease   . Pacemaker    Past Surgical History  Procedure Laterality Date  . Pacemaker insertion    . Lumbar laminectomy      5 back surgeries  . Hand surgery      3 broken bones  . Bunionectomy      right foot   Family History  Problem Relation Age of Onset  . Stroke      grandfather and uncle died of stroke  . Heart attack      uncle  . Cancer Father     throat  . Cancer - Other Father     throat  . Transient ischemic attack Mother     3287 and doing well  . Heart disease Mother   . COPD Mother   . Hypertension Sister   . COPD Sister   .  Hypertension Sister   . COPD Sister   . Hypertension Sister   . COPD Sister   . Hypertension Sister   . COPD Sister   . Hypertension Sister   . COPD Sister    History  Substance Use Topics  . Smoking status: Current Every Day Smoker    Types: Cigarettes  . Smokeless tobacco: Never Used  . Alcohol Use: No     Comment: seldom    Review of Systems  Constitutional: Positive for chills. Negative for fever.  Respiratory: Positive for cough, chest tightness and shortness of breath.   Cardiovascular: Positive for chest pain. Negative for leg swelling.  Gastrointestinal: Negative.  Negative for vomiting, abdominal pain and diarrhea.  Genitourinary: Negative.  Negative for dysuria.  Musculoskeletal: Negative for back pain.  Skin: Negative for rash.  Neurological: Positive for headaches. Negative for facial asymmetry.  All other systems reviewed and are negative.     Allergies  Dilaudid  Home Medications   Prior to Admission medications   Medication Sig Start Date End Date Taking? Authorizing Provider  albuterol (PROVENTIL HFA;VENTOLIN HFA) 108 (90 BASE) MCG/ACT inhaler Inhale 2 puffs into the lungs 2 (two)  times daily as needed for wheezing or shortness of breath.    Yes Historical Provider, MD  albuterol (PROVENTIL) (2.5 MG/3ML) 0.083% nebulizer solution Take 2.5 mg by nebulization 2 (two) times daily.    Yes Historical Provider, MD  amLODipine (NORVASC) 5 MG tablet Take 1 tablet (5 mg total) by mouth daily. 01/15/14  Yes Duke SalviaSteven C Klein, MD  apixaban (ELIQUIS) 5 MG TABS tablet Take 5 mg by mouth 2 (two) times daily.   Yes Historical Provider, MD  atorvastatin (LIPITOR) 20 MG tablet Take 20 mg by mouth daily.   Yes Historical Provider, MD  budesonide-formoterol (SYMBICORT) 160-4.5 MCG/ACT inhaler Inhale 2 puffs into the lungs daily.   Yes Historical Provider, MD  lisinopril-hydrochlorothiazide (ZESTORETIC) 10-12.5 MG per tablet Take 1 tablet by mouth daily. 06/05/14  Yes Calvert CantorSaima  Rizwan, MD  omeprazole (PRILOSEC) 20 MG capsule Take 20 mg by mouth daily.   Yes Historical Provider, MD  oxyCODONE (OXY IR/ROXICODONE) 5 MG immediate release tablet Take 1 tablet (5 mg total) by mouth every 4 (four) hours as needed for moderate pain. 06/05/14  Yes Calvert CantorSaima Rizwan, MD  polyethylene glycol (MIRALAX / GLYCOLAX) packet Take 17 g by mouth daily.   Yes Historical Provider, MD  doxycycline (VIBRAMYCIN) 100 MG capsule Take 1 capsule (100 mg total) by mouth 2 (two) times daily. 06/25/14   Shon Batonourtney F Horton, MD  predniSONE (DELTASONE) 20 MG tablet Take 3 tablets (60 mg total) by mouth daily with breakfast. 06/25/14   Shon Batonourtney F Horton, MD   BP 136/77  Pulse 67  Temp(Src) 98 F (36.7 C) (Oral)  Resp 18  Ht 6\' 1"  (1.854 m)  Wt 301 lb (136.533 kg)  BMI 39.72 kg/m2  SpO2 95% Physical Exam  Nursing note and vitals reviewed. Constitutional: He is oriented to person, place, and time. He appears well-developed and well-nourished.  HENT:  Head: Normocephalic and atraumatic.  Cardiovascular: Normal rate, regular rhythm and normal heart sounds.   No murmur heard. Pulmonary/Chest: Effort normal. No respiratory distress. He has wheezes. He has no rales.  Abdominal: Soft. Bowel sounds are normal. There is no tenderness. There is no rebound.  Musculoskeletal:  Trace bilateral lower extremity edema  Neurological: He is alert and oriented to person, place, and time.  Skin: Skin is warm and dry.  Psychiatric: He has a normal mood and affect.    ED Course  Procedures (including critical care time) Labs Review Labs Reviewed  BASIC METABOLIC PANEL - Abnormal; Notable for the following:    GFR calc non Af Amer 60 (*)    GFR calc Af Amer 70 (*)    All other components within normal limits  PRO B NATRIURETIC PEPTIDE - Abnormal; Notable for the following:    Pro B Natriuretic peptide (BNP) 158.4 (*)    All other components within normal limits  CBC WITH DIFFERENTIAL    Imaging Review Dg  Chest 2 View  06/25/2014   CLINICAL DATA:  Shortness of breath, chest pain.  EXAM: CHEST  2 VIEW  COMPARISON:  06/03/2014  FINDINGS: Prominent cardiomediastinal contours, similar to prior. Left chest wall battery pack with unchanged lead tip positions. Mild interstitial prominence. Mild central vascular congestion. No overt pleural effusion. No pneumothorax. Multilevel degenerative change.  IMPRESSION: Prominent cardiomediastinal contours, similar to prior.  Vascular congestion. Mild interstitial prominence may reflect mild edema.   Electronically Signed   By: Jearld LeschAndrew  DelGaizo M.D.   On: 06/25/2014 03:35     EKG Interpretation Independently by myself:  Normal sinus rhythm with a rate of 59, no evidence of acute ST elevation, PAC noted      MDM   Final diagnoses:  Shortness of breath  Chronic obstructive pulmonary disease with acute exacerbation    Patient presents with chills, cough. Nontoxic on exam. Initial vital signs reassuring. Patient does have expiratory wheezing. No acute respiratory distress. He is afebrile. Patient was given a DuoNeb and prednisone. Basic lab work obtained and is largely reassuring. Chest x-ray shows mild vascular congestion. This is in setting of BNP of 158. Low suspicion at this time for heart failure. Suspect COPD. On repeat exam, patient has improved aeration but continues to wheeze. Second DuoNeb ordered. Patient was able to ambulate with pulse ox and maintain his oxygen saturations at 95%. Will discharge home with doxycycline and prednisone.  After history, exam, and medical workup I feel the patient has been appropriately medically screened and is safe for discharge home. Pertinent diagnoses were discussed with the patient. Patient was given return precautions.     Shon Baton, MD 06/25/14 [phone]

## 2014-06-25 NOTE — ED Notes (Signed)
Pt. reports dry cough , chest congestion , fever , nasal congestion and chills onset 2 days ago.

## 2014-06-25 NOTE — ED Notes (Signed)
Pt. Refused wheelchair 

## 2014-07-14 ENCOUNTER — Inpatient Hospital Stay (HOSPITAL_COMMUNITY)
Admission: EM | Admit: 2014-07-14 | Discharge: 2014-07-17 | DRG: 101 | Disposition: A | Discharge status: Home / Self Care | Payer: Medicare Other | Attending: Internal Medicine | Admitting: Internal Medicine

## 2014-07-14 ENCOUNTER — Emergency Department (HOSPITAL_COMMUNITY): Payer: Medicare Other

## 2014-07-14 ENCOUNTER — Encounter (HOSPITAL_COMMUNITY): Payer: Self-pay | Admitting: Emergency Medicine

## 2014-07-14 DIAGNOSIS — R519 Headache, unspecified: Secondary | ICD-10-CM

## 2014-07-14 DIAGNOSIS — I480 Paroxysmal atrial fibrillation: Secondary | ICD-10-CM | POA: Diagnosis present

## 2014-07-14 DIAGNOSIS — Z79899 Other long term (current) drug therapy: Secondary | ICD-10-CM

## 2014-07-14 DIAGNOSIS — Z720 Tobacco use: Secondary | ICD-10-CM

## 2014-07-14 DIAGNOSIS — Z7951 Long term (current) use of inhaled steroids: Secondary | ICD-10-CM

## 2014-07-14 DIAGNOSIS — I129 Hypertensive chronic kidney disease with stage 1 through stage 4 chronic kidney disease, or unspecified chronic kidney disease: Secondary | ICD-10-CM | POA: Diagnosis present

## 2014-07-14 DIAGNOSIS — G43909 Migraine, unspecified, not intractable, without status migrainosus: Secondary | ICD-10-CM | POA: Diagnosis present

## 2014-07-14 DIAGNOSIS — N189 Chronic kidney disease, unspecified: Secondary | ICD-10-CM | POA: Diagnosis present

## 2014-07-14 DIAGNOSIS — G894 Chronic pain syndrome: Secondary | ICD-10-CM | POA: Diagnosis present

## 2014-07-14 DIAGNOSIS — Z8673 Personal history of transient ischemic attack (TIA), and cerebral infarction without residual deficits: Secondary | ICD-10-CM

## 2014-07-14 DIAGNOSIS — I639 Cerebral infarction, unspecified: Secondary | ICD-10-CM

## 2014-07-14 DIAGNOSIS — R569 Unspecified convulsions: Secondary | ICD-10-CM | POA: Diagnosis present

## 2014-07-14 DIAGNOSIS — M1990 Unspecified osteoarthritis, unspecified site: Secondary | ICD-10-CM | POA: Diagnosis present

## 2014-07-14 DIAGNOSIS — Z7902 Long term (current) use of antithrombotics/antiplatelets: Secondary | ICD-10-CM

## 2014-07-14 DIAGNOSIS — Z792 Long term (current) use of antibiotics: Secondary | ICD-10-CM

## 2014-07-14 DIAGNOSIS — Z23 Encounter for immunization: Secondary | ICD-10-CM | POA: Diagnosis not present

## 2014-07-14 DIAGNOSIS — N179 Acute kidney failure, unspecified: Secondary | ICD-10-CM | POA: Diagnosis present

## 2014-07-14 DIAGNOSIS — Z950 Presence of cardiac pacemaker: Secondary | ICD-10-CM | POA: Diagnosis not present

## 2014-07-14 DIAGNOSIS — J449 Chronic obstructive pulmonary disease, unspecified: Secondary | ICD-10-CM | POA: Diagnosis present

## 2014-07-14 DIAGNOSIS — K219 Gastro-esophageal reflux disease without esophagitis: Secondary | ICD-10-CM | POA: Diagnosis present

## 2014-07-14 DIAGNOSIS — R51 Headache: Secondary | ICD-10-CM

## 2014-07-14 DIAGNOSIS — I69351 Hemiplegia and hemiparesis following cerebral infarction affecting right dominant side: Secondary | ICD-10-CM | POA: Diagnosis not present

## 2014-07-14 DIAGNOSIS — I10 Essential (primary) hypertension: Secondary | ICD-10-CM | POA: Diagnosis present

## 2014-07-14 DIAGNOSIS — E785 Hyperlipidemia, unspecified: Secondary | ICD-10-CM | POA: Diagnosis present

## 2014-07-14 LAB — COMPREHENSIVE METABOLIC PANEL
ALK PHOS: 98 U/L (ref 39–117)
ALT: 18 U/L (ref 0–53)
AST: 21 U/L (ref 0–37)
Albumin: 3.8 g/dL (ref 3.5–5.2)
Anion gap: 12 (ref 5–15)
BUN: 19 mg/dL (ref 6–23)
CALCIUM: 9.8 mg/dL (ref 8.4–10.5)
CO2: 27 meq/L (ref 19–32)
Chloride: 101 mEq/L (ref 96–112)
Creatinine, Ser: 1.44 mg/dL — ABNORMAL HIGH (ref 0.50–1.35)
GFR calc non Af Amer: 53 mL/min — ABNORMAL LOW (ref >90)
GFR, EST AFRICAN AMERICAN: 61 mL/min — AB (ref >90)
GLUCOSE: 90 mg/dL (ref 70–99)
POTASSIUM: 4.3 meq/L (ref 3.7–5.3)
SODIUM: 140 meq/L (ref 137–147)
Total Bilirubin: 0.2 mg/dL — ABNORMAL LOW (ref 0.3–1.2)
Total Protein: 7.2 g/dL (ref 6.0–8.3)

## 2014-07-14 LAB — APTT: aPTT: 29 seconds (ref 24–37)

## 2014-07-14 LAB — I-STAT CHEM 8, ED
BUN: 22 mg/dL (ref 6–23)
CALCIUM ION: 1.23 mmol/L (ref 1.12–1.23)
CHLORIDE: 102 meq/L (ref 96–112)
Creatinine, Ser: 1.5 mg/dL — ABNORMAL HIGH (ref 0.50–1.35)
Glucose, Bld: 90 mg/dL (ref 70–99)
HCT: 49 % (ref 39.0–52.0)
Hemoglobin: 16.7 g/dL (ref 13.0–17.0)
POTASSIUM: 3.7 meq/L (ref 3.7–5.3)
Sodium: 142 mEq/L (ref 137–147)
TCO2: 27 mmol/L (ref 0–100)

## 2014-07-14 LAB — CBC
HCT: 46.3 % (ref 39.0–52.0)
Hemoglobin: 15.7 g/dL (ref 13.0–17.0)
MCH: 30.2 pg (ref 26.0–34.0)
MCHC: 33.9 g/dL (ref 30.0–36.0)
MCV: 89 fL (ref 78.0–100.0)
PLATELETS: 201 10*3/uL (ref 150–400)
RBC: 5.2 MIL/uL (ref 4.22–5.81)
RDW: 14 % (ref 11.5–15.5)
WBC: 5.4 10*3/uL (ref 4.0–10.5)

## 2014-07-14 LAB — PROTIME-INR
INR: 1.03 (ref 0.00–1.49)
PROTHROMBIN TIME: 13.6 s (ref 11.6–15.2)

## 2014-07-14 LAB — DIFFERENTIAL
BASOS ABS: 0 10*3/uL (ref 0.0–0.1)
Basophils Relative: 0 % (ref 0–1)
Eosinophils Absolute: 0.2 10*3/uL (ref 0.0–0.7)
Eosinophils Relative: 3 % (ref 0–5)
LYMPHS PCT: 49 % — AB (ref 12–46)
Lymphs Abs: 2.6 10*3/uL (ref 0.7–4.0)
Monocytes Absolute: 0.5 10*3/uL (ref 0.1–1.0)
Monocytes Relative: 10 % (ref 3–12)
Neutro Abs: 2.1 10*3/uL (ref 1.7–7.7)
Neutrophils Relative %: 38 % — ABNORMAL LOW (ref 43–77)

## 2014-07-14 LAB — CBG MONITORING, ED: Glucose-Capillary: 79 mg/dL (ref 70–99)

## 2014-07-14 LAB — ETHANOL

## 2014-07-14 LAB — I-STAT TROPONIN, ED: Troponin i, poc: 0.02 ng/mL (ref 0.00–0.08)

## 2014-07-14 NOTE — ED Notes (Signed)
Pt CBG checked 79 RN informed

## 2014-07-14 NOTE — ED Provider Notes (Signed)
CSN: [id number]     Arrival date & time 07/14/14  2200 History   First MD Initiated Contact with Patient 07/14/14 2209     Chief Complaint  Patient presents with  . Code Stroke    An emergency department physician performed an initial assessment on this suspected stroke patient at 2216.  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  He presents with acute onset develop being right arm weakness as well as twitching of his face and his right arm that was uncontrollable.  He is on enalapril was.  He reports compliance with his medications.  He reports preceding right-sided weakness from his stroke but reports it feels slightly heavier in his right arm at this time.  Mild right posterior headache at this time.Presented to triage and a code stroke was called.    Past Medical History  Diagnosis Date  . Hypertension     amlodipine, lisinopril  . Stroke     was on aspirin before stroke. July [place] in [place]  sent home aspirin.   Marland Kitchen. Hyperlipidemia     zocor 40mg   . Sick sinus syndrome     s/p pacemaker, no defibrillator  . GERD (gastroesophageal reflux disease)   . Chronic pain syndrome   . Seasonal allergies   . Asthma   . Chronic kidney disease   . GERD (gastroesophageal reflux disease)   . Arthritis   . COPD (chronic obstructive pulmonary disease)   . Lumbar disc disease   . Pacemaker    Past Surgical History  Procedure Laterality Date  . Pacemaker insertion    . Lumbar laminectomy      5 back surgeries  . Hand surgery      3 broken bones  . Bunionectomy      right foot   Family History  Problem Relation Age of Onset  . Stroke      grandfather and uncle died of stroke  . Heart attack      uncle  . Cancer Father     throat  . Cancer - Other Father     throat  . Transient ischemic attack Mother     3487 and doing well  . Heart disease Mother   . COPD Mother   . Hypertension Sister   . COPD Sister   . Hypertension Sister   . COPD Sister   . Hypertension  Sister   . COPD Sister   . Hypertension Sister   . COPD Sister   . Hypertension Sister   . COPD Sister    History  Substance Use Topics  . Smoking status: Current Every Day Smoker    Types: Cigarettes  . Smokeless tobacco: Never Used  . Alcohol Use: No     Comment: seldom    Review of Systems  All other systems reviewed and are negative.     Allergies  Dilaudid  Home Medications   Prior to Admission medications   Medication Sig Start Date End Date Taking? Authorizing Provider  albuterol (PROVENTIL HFA;VENTOLIN HFA) 108 (90 BASE) MCG/ACT inhaler Inhale 2 puffs into the lungs 2 (two) times daily as needed for wheezing or shortness of breath.     Historical Provider, MD  albuterol (PROVENTIL) (2.5 MG/3ML) 0.083% nebulizer solution Take 2.5 mg by nebulization 2 (two) times daily.     Historical Provider, MD  amLODipine (NORVASC) 5 MG tablet Take 1 tablet (5 mg total) by mouth daily. 01/15/14   Duke SalviaSteven C Klein, MD  apixaban (  ELIQUIS) 5 MG TABS tablet Take 5 mg by mouth 2 (two) times daily.    Historical Provider, MD  atorvastatin (LIPITOR) 20 MG tablet Take 20 mg by mouth daily.    Historical Provider, MD  budesonide-formoterol (SYMBICORT) 160-4.5 MCG/ACT inhaler Inhale 2 puffs into the lungs daily.    Historical Provider, MD  doxycycline (VIBRAMYCIN) 100 MG capsule Take 1 capsule (100 mg total) by mouth 2 (two) times daily. 06/25/14   Shon Baton, MD  lisinopril-hydrochlorothiazide (ZESTORETIC) 10-12.5 MG per tablet Take 1 tablet by mouth daily. 06/05/14   Calvert Cantor, MD  omeprazole (PRILOSEC) 20 MG capsule Take 20 mg by mouth daily.    Historical Provider, MD  oxyCODONE (OXY IR/ROXICODONE) 5 MG immediate release tablet Take 1 tablet (5 mg total) by mouth every 4 (four) hours as needed for moderate pain. 06/05/14   Calvert Cantor, MD  polyethylene glycol (MIRALAX / GLYCOLAX) packet Take 17 g by mouth daily.    Historical Provider, MD  predniSONE (DELTASONE) 20 MG tablet Take 3  tablets (60 mg total) by mouth daily with breakfast. 06/25/14   Shon Baton, MD   BP 163/105 mmHg  Pulse 70  Temp(Src) 98.1 F (36.7 C) (Oral)  Resp 20  Ht 6' (1.829 m)  Wt 290 lb (131.543 kg)  BMI 39.32 kg/m2  SpO2 92% Physical Exam  Constitutional: He is oriented to person, place, and time. He appears well-developed and well-nourished.  HENT:  Head: Normocephalic and atraumatic.  Eyes: EOM are normal.  Neck: Normal range of motion.  Cardiovascular: Normal rate, regular rhythm, normal heart sounds and intact distal pulses.   Pulmonary/Chest: Effort normal and breath sounds normal. No respiratory distress.  Abdominal: Soft. He exhibits no distension. There is no tenderness.  Musculoskeletal: Normal range of motion.  Neurological: He is alert and oriented to person, place, and time.  Mild right arm and right leg weakness as compared to left.  Mild seizure-like activity noted in his right upper extremity.  Skin: Skin is warm and dry.  Psychiatric: He has a normal mood and affect. Judgment normal.  Nursing note and vitals reviewed.   ED Course  Procedures (including critical care time)  CRITICAL CARE Performed by: Lyanne Co Total critical care time: 35 Critical care time was exclusive of separately billable procedures and treating other patients. Critical care was necessary to treat or prevent imminent or life-threatening deterioration. Critical care was time spent personally by me on the following activities: development of treatment plan with patient and/or surrogate as well as nursing, discussions with consultants, evaluation of patient's response to treatment, examination of patient, obtaining history from patient or surrogate, ordering and performing treatments and interventions, ordering and review of laboratory studies, ordering and review of radiographic studies, pulse oximetry and re-evaluation of patient's condition.   Labs Review Labs Reviewed  DIFFERENTIAL  - Abnormal; Notable for the following:    Neutrophils Relative % 38 (*)    Lymphocytes Relative 49 (*)    All other components within normal limits  COMPREHENSIVE METABOLIC PANEL - Abnormal; Notable for the following:    Creatinine, Ser 1.44 (*)    Total Bilirubin 0.2 (*)    GFR calc non Af Amer 53 (*)    GFR calc Af Amer 61 (*)    All other components within normal limits  I-STAT CHEM 8, ED - Abnormal; Notable for the following:    Creatinine, Ser 1.50 (*)    All other components within normal limits  ETHANOL  CBC  PROTIME-INR  APTT  URINE RAPID DRUG SCREEN (HOSP PERFORMED)  URINALYSIS, ROUTINE W REFLEX MICROSCOPIC  I-STAT TROPOININ, ED  CBG MONITORING, ED    Imaging Review Ct Head Wo Contrast  07/14/2014   CLINICAL DATA:  Headaches, right-sided facial: And right-sided weakness  EXAM: CT HEAD WITHOUT CONTRAST  TECHNIQUE: Contiguous axial images were obtained from the base of the skull through the vertex without intravenous contrast.  COMPARISON:  06/03/2014  FINDINGS: Bony calvarium is intact. Paranasal sinuses are well aerated. A small amount of fluid is noted within the left mastoid air cells which is stable from the prior exam. Prior lacunar infarct is noted on the right than the recent of basal ganglia and corona radiata which is stable from the prior exam. Prior lacunar infarct in the left thalamus is also noted. No acute hemorrhage, acute infarction or space-occupying mass lesion is noted. Mild chronic white matter ischemic change is noted.  IMPRESSION: Chronic changes without acute abnormality.  These results were called by telephone at the time of interpretation on 07/14/2014 at 10:51 pm to Dr. Leroy Kennedyamilo, who verbally acknowledged these results.   Electronically Signed   By: Alcide CleverMark  Lukens M.D.   On: 07/14/2014 22:52  I personally reviewed the imaging tests through PACS system I reviewed available ER/hospitalization records through the EMR    EKG Interpretation   Date/Time:   Sunday July 14 2014 22:28:57 EST Ventricular Rate:  68 PR Interval:  178 QRS Duration: 99 QT Interval:  382 QTC Calculation: 406 R Axis:   78 Text Interpretation:  Age not entered, assumed to be  58 years old for  purpose of ECG interpretation Sinus rhythm Baseline wander in lead(s) V4  V6 No significant change was found Confirmed by Janeann Paisley  MD, Caryn BeeKEVIN ([id number])  on 07/14/2014 11:22:44 PM      MDM   Final diagnoses:  Seizure-like activity    Suspect focal seizure.  See my neurology.  No indication for TPA at this time.  Patient is not a candidate for TPA given stroke  In the last 3 months.  His seizure-like activity is resolved.  Neurology recommends admission to the hospital, EEG in the a.m., ongoing evaluation.  At this time neurology would like to hold antiepileptic drugs unless he began seizing again.    Lyanne CoKevin M Ewan Grau, MD 07/14/14 [phone]

## 2014-07-14 NOTE — Consult Note (Addendum)
Referring Physician: ED    Chief Complaint: code stroke, right hemiparesis, right face weakness  HPI:                                                                                                                                         Dennis Brock is an 58 y.o. 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  He stated that he was home when suddenly his left arm and face started " twitching and feeling heavy like a wood". Said that the twitching of the right arm and face was uncontrollable, lasting several minutes, off and on. No associated impairment of consciousness, HA, vertigo, double vision, slurred speech, language or vision impairment. Denies ever having " twitching" in any body part. Adherence to treatment is not an issue as per patient. NIHSS 2. CT brain showed no acute abnormality.  Date last known well:  Time last known well:  tPA Given: no, on apixaban, minimal deficits that could be old, ? Focal motor seizure. NIHSS: 2   Past Medical History  Diagnosis Date  . Hypertension     amlodipine, lisinopril  . Stroke     was on aspirin before stroke. July 2012 in [place].  sent home aspirin.   Marland Kitchen. Hyperlipidemia     zocor 40mg   . Sick sinus syndrome     s/p pacemaker, no defibrillator  . GERD (gastroesophageal reflux disease)   . Chronic pain syndrome   . Seasonal allergies   . Asthma   . Chronic kidney disease   . GERD (gastroesophageal reflux disease)   . Arthritis   . COPD (chronic obstructive pulmonary disease)   . Lumbar disc disease   . Pacemaker     Past Surgical History  Procedure Laterality Date  . Pacemaker insertion    . Lumbar laminectomy      5  back surgeries  . Hand surgery      3 broken bones  . Bunionectomy      right foot    Family History  Problem Relation Age of Onset  . Stroke      grandfather and uncle died of stroke  . Heart attack      uncle  . Cancer Father     throat  . Cancer - Other Father     throat  . Transient ischemic attack Mother     4187 and doing well  . Heart disease Mother   . COPD Mother   . Hypertension Sister   . COPD Sister   . Hypertension Sister   . COPD Sister   . Hypertension Sister   . COPD Sister   .  Hypertension Sister   . COPD Sister   . Hypertension Sister   . COPD Sister    Social History:  reports that he has been smoking Cigarettes.  He has been smoking about 0.00 packs per day. He has never used smokeless tobacco. He reports that he uses illicit drugs (Cocaine). He reports that he does not drink alcohol.  Allergies:  Allergies  Allergen Reactions  . Dilaudid [Hydromorphone] Itching    Medications:                                                                                                                           I have reviewed the patient's current medications.  ROS:                                                                                                                                       History obtained from the patient and chart review.  General ROS: negative for - chills, fatigue, fever, night sweats, or weight loss Psychological ROS: negative for - behavioral disorder, hallucinations, memory difficulties, mood swings or suicidal ideation Ophthalmic ROS: negative for - blurry vision, double vision, eye pain or loss of vision ENT ROS: negative for - epistaxis, nasal discharge, oral lesions, sore throat, tinnitus or vertigo Allergy and Immunology ROS: negative for - hives or itchy/watery eyes Hematological and Lymphatic ROS: negative for - bleeding problems, bruising or swollen lymph nodes Endocrine ROS: negative for - galactorrhea, hair pattern  changes, polydipsia/polyuria or temperature intolerance Respiratory ROS: negative for - cough or hemoptysis Cardiovascular ROS: negative for - chest pain, dyspnea on exertion, edema or irregular heartbeat Gastrointestinal ROS: negative for - abdominal pain, diarrhea, hematemesis, nausea/vomiting or stool incontinence Genito-Urinary ROS: negative for - dysuria, hematuria, incontinence or urinary frequency/urgency Musculoskeletal ROS: negative for - joint swelling Neurological ROS: as noted in HPI Dermatological ROS: negative for rash and skin lesion changes  Physical exam: pleasant male in no apparent distress. Blood pressure 163/105, pulse 70, temperature 98.1 F (36.7 C), temperature source Oral, resp. rate 20, height 6' (1.829 m), weight 131.543 kg (290 lb), SpO2 92 %. Head: normocephalic. Neck: supple, no bruits, no JVD. Cardiac: no murmurs. Lungs: clear. Abdomen: soft, no tender, no mass. Extremities: no edema. Neurologic Examination:  General: Mental Status: Alert, oriented, thought content appropriate.  Speech fluent without evidence of aphasia.  Able to follow 3 step commands without difficulty. Cranial Nerves: II: Discs flat bilaterally; Visual fields impaired upper quadrants, pupils equal, round, reactive to light and accommodation III,IV, VI: ptosis not present, extra-ocular motions intact bilaterally V,VII: smile symmetric, facial light touch sensation normal bilaterally VIII: hearing normal bilaterally IX,X: gag reflex present XI: bilateral shoulder shrug XII: midline tongue extension without atrophy or fasciculations Motor: Right : Upper extremity   5/5    Left:     Upper extremity   5/5  Lower extremity   5/5     Lower extremity   5/5 Tone and bulk:normal tone throughout; no atrophy noted Sensory: Pinprick and light touch intact diminished in the right arm. Deep Tendon  Reflexes:  Right: Upper Extremity   Left: Upper extremity   biceps (C-5 to C-6) 2/4   biceps (C-5 to C-6) 2/4 tricep (C7) 2/4    triceps (C7) 2/4 Brachioradialis (C6) 2/4  Brachioradialis (C6) 2/4  Lower Extremity Lower Extremity  quadriceps (L-2 to L-4) 2/4   quadriceps (L-2 to L-4) 2/4 Achilles (S1) 2/4   Achilles (S1) 2/4  Plantars: Right: downgoing   Left: downgoing Cerebellar: normal finger-to-nose,  normal heel-to-shin test Gait:  No tested CV: pulses palpable throughout    Results for orders placed or performed during the hospital encounter of 07/14/14 (from the past 48 hour(s))  CBC     Status: None   Collection Time: 07/14/14 10:24 PM  Result Value Ref Range   WBC 5.4 4.0 - 10.5 K/uL   RBC 5.20 4.22 - 5.81 MIL/uL   Hemoglobin 15.7 13.0 - 17.0 g/dL   HCT 16.1 09.6 - 04.5 %   MCV 89.0 78.0 - 100.0 fL   MCH 30.2 26.0 - 34.0 pg   MCHC 33.9 30.0 - 36.0 g/dL   RDW 40.9 81.1 - 91.4 %   Platelets 201 150 - 400 K/uL  Differential     Status: Abnormal   Collection Time: 07/14/14 10:24 PM  Result Value Ref Range   Neutrophils Relative % 38 (L) 43 - 77 %   Neutro Abs 2.1 1.7 - 7.7 K/uL   Lymphocytes Relative 49 (H) 12 - 46 %   Lymphs Abs 2.6 0.7 - 4.0 K/uL   Monocytes Relative 10 3 - 12 %   Monocytes Absolute 0.5 0.1 - 1.0 K/uL   Eosinophils Relative 3 0 - 5 %   Eosinophils Absolute 0.2 0.0 - 0.7 K/uL   Basophils Relative 0 0 - 1 %   Basophils Absolute 0.0 0.0 - 0.1 K/uL  CBG monitoring, ED     Status: None   Collection Time: 07/14/14 10:31 PM  Result Value Ref Range   Glucose-Capillary 79 70 - 99 mg/dL  I-Stat Chem 8, ED     Status: Abnormal   Collection Time: 07/14/14 10:37 PM  Result Value Ref Range   Sodium 142 137 - 147 mEq/L   Potassium 3.7 3.7 - 5.3 mEq/L   Chloride 102 96 - 112 mEq/L   BUN 22 6 - 23 mg/dL   Creatinine, Ser 7.82 (H) 0.50 - 1.35 mg/dL   Glucose, Bld 90 70 - 99 mg/dL   Calcium, Ion 9.56 2.13 - 1.23 mmol/L   TCO2 27 0 - 100 mmol/L    Hemoglobin 16.7 13.0 - 17.0 g/dL   HCT 08.6 57.8 - 46.9 %   No results found.  Assessment: 58 y.o. male presents with complains of uncontrollable twitching/jerkiness/heaviness right arm-face. NIHSS 2 ( most likely old deficits) and CT brain without acute abnormality. On apixaban. More concerned about a possible right focal seizure without impairment of consciousness rather than a new stroke. Although prior stroke was not cortically based, it doesn't exclude the likelihood of structural post stroke seizure but make it less likely, and thus will defer starting AED at this moment unless these episodes recur tonight. Recommend EEG in the morning. He can not have MRI due to pacemaker, thus repeat unenhanced CT brain in 24 hours. Will follow up.  Stroke Risk Factors -  HTN, hyperlipidemia, PAF, cocaine use, smoker, prior cerebral infarct    Wyatt Portela, MD [facility] [phone]  07/14/2014, 10:42 PM

## 2014-07-14 NOTE — ED Notes (Signed)
Patient with right sided facial palsy, headache and right sided weakness in right arm and leg.  Patient did have a previous stroke last month.  Patient having facial twitching on the right side.  Patient is on Eliquis.

## 2014-07-14 NOTE — ED Notes (Signed)
Pt placed in a gown, BP cuff on and pulse ox

## 2014-07-15 ENCOUNTER — Observation Stay (HOSPITAL_COMMUNITY): Payer: Medicare Other

## 2014-07-15 ENCOUNTER — Ambulatory Visit: Payer: Self-pay | Admitting: Nurse Practitioner

## 2014-07-15 ENCOUNTER — Encounter (HOSPITAL_COMMUNITY): Payer: Self-pay | Admitting: Internal Medicine

## 2014-07-15 DIAGNOSIS — R569 Unspecified convulsions: Secondary | ICD-10-CM | POA: Diagnosis not present

## 2014-07-15 DIAGNOSIS — I480 Paroxysmal atrial fibrillation: Secondary | ICD-10-CM

## 2014-07-15 DIAGNOSIS — I10 Essential (primary) hypertension: Secondary | ICD-10-CM | POA: Diagnosis present

## 2014-07-15 DIAGNOSIS — I6330 Cerebral infarction due to thrombosis of unspecified cerebral artery: Secondary | ICD-10-CM | POA: Insufficient documentation

## 2014-07-15 DIAGNOSIS — E785 Hyperlipidemia, unspecified: Secondary | ICD-10-CM

## 2014-07-15 LAB — CBC WITH DIFFERENTIAL/PLATELET
BASOS PCT: 0 % (ref 0–1)
Basophils Absolute: 0 10*3/uL (ref 0.0–0.1)
Eosinophils Absolute: 0.2 10*3/uL (ref 0.0–0.7)
Eosinophils Relative: 4 % (ref 0–5)
HCT: 43.6 % (ref 39.0–52.0)
Hemoglobin: 14.5 g/dL (ref 13.0–17.0)
Lymphocytes Relative: 50 % — ABNORMAL HIGH (ref 12–46)
Lymphs Abs: 2.2 10*3/uL (ref 0.7–4.0)
MCH: 29.7 pg (ref 26.0–34.0)
MCHC: 33.3 g/dL (ref 30.0–36.0)
MCV: 89.3 fL (ref 78.0–100.0)
Monocytes Absolute: 0.4 10*3/uL (ref 0.1–1.0)
Monocytes Relative: 8 % (ref 3–12)
NEUTROS PCT: 38 % — AB (ref 43–77)
Neutro Abs: 1.7 10*3/uL (ref 1.7–7.7)
PLATELETS: 183 10*3/uL (ref 150–400)
RBC: 4.88 MIL/uL (ref 4.22–5.81)
RDW: 14 % (ref 11.5–15.5)
WBC: 4.3 10*3/uL (ref 4.0–10.5)

## 2014-07-15 LAB — COMPREHENSIVE METABOLIC PANEL
ALK PHOS: 92 U/L (ref 39–117)
ALT: 19 U/L (ref 0–53)
AST: 17 U/L (ref 0–37)
Albumin: 3.6 g/dL (ref 3.5–5.2)
Anion gap: 13 (ref 5–15)
BUN: 18 mg/dL (ref 6–23)
CALCIUM: 9.2 mg/dL (ref 8.4–10.5)
CHLORIDE: 103 meq/L (ref 96–112)
CO2: 26 meq/L (ref 19–32)
Creatinine, Ser: 1.37 mg/dL — ABNORMAL HIGH (ref 0.50–1.35)
GFR, EST AFRICAN AMERICAN: 65 mL/min — AB (ref >90)
GFR, EST NON AFRICAN AMERICAN: 56 mL/min — AB (ref >90)
GLUCOSE: 123 mg/dL — AB (ref 70–99)
Potassium: 3.8 mEq/L (ref 3.7–5.3)
SODIUM: 142 meq/L (ref 137–147)
Total Protein: 6.2 g/dL (ref 6.0–8.3)

## 2014-07-15 LAB — GLUCOSE, CAPILLARY
GLUCOSE-CAPILLARY: 109 mg/dL — AB (ref 70–99)
Glucose-Capillary: 94 mg/dL (ref 70–99)
Glucose-Capillary: 122 mg/dL — ABNORMAL HIGH (ref 70–99)
Glucose-Capillary: 87 mg/dL (ref 70–99)

## 2014-07-15 LAB — RAPID URINE DRUG SCREEN, HOSP PERFORMED
AMPHETAMINES: NOT DETECTED
BENZODIAZEPINES: NOT DETECTED
Barbiturates: NOT DETECTED
Cocaine: POSITIVE — AB
Opiates: NOT DETECTED
TETRAHYDROCANNABINOL: NOT DETECTED

## 2014-07-15 LAB — URINALYSIS, ROUTINE W REFLEX MICROSCOPIC
Bilirubin Urine: NEGATIVE
GLUCOSE, UA: NEGATIVE mg/dL
KETONES UR: NEGATIVE mg/dL
Leukocytes, UA: NEGATIVE
Nitrite: NEGATIVE
PROTEIN: NEGATIVE mg/dL
Specific Gravity, Urine: 1.015 (ref 1.005–1.030)
UROBILINOGEN UA: 0.2 mg/dL (ref 0.0–1.0)
pH: 5.5 (ref 5.0–8.0)

## 2014-07-15 LAB — URINE MICROSCOPIC-ADD ON

## 2014-07-15 LAB — SEDIMENTATION RATE: SED RATE: 3 mm/h (ref 0–16)

## 2014-07-15 MED ORDER — INFLUENZA VAC SPLIT QUAD 0.5 ML IM SUSY
0.5000 mL | PREFILLED_SYRINGE | INTRAMUSCULAR | Status: AC
  Administered 2014-07-16: 0.5 mL via INTRAMUSCULAR
  Filled 2014-07-15: qty 0.5

## 2014-07-15 MED ORDER — MORPHINE SULFATE 2 MG/ML IJ SOLN
1.0000 mg | Freq: Once | INTRAMUSCULAR | Status: AC
Start: 2014-07-15 — End: 2014-07-15
  Administered 2014-07-15: 1 mg via INTRAVENOUS
  Filled 2014-07-15: qty 1

## 2014-07-15 MED ORDER — ACETAMINOPHEN 325 MG PO TABS: 650.0000 mg | ORAL_TABLET | ORAL | Status: DC | PRN

## 2014-07-15 MED ORDER — ACETAMINOPHEN 325 MG PO TABS
650.0000 mg | ORAL_TABLET | Freq: Once | ORAL | Status: AC
  Administered 2014-07-15: 650 mg via ORAL
  Filled 2014-07-15: qty 2

## 2014-07-15 MED ORDER — SODIUM CHLORIDE 0.9 % IV SOLN: INTRAVENOUS | Status: DC

## 2014-07-15 MED ORDER — ALBUTEROL SULFATE (2.5 MG/3ML) 0.083% IN NEBU: 2.5000 mg | INHALATION_SOLUTION | Freq: Four times a day (QID) | RESPIRATORY_TRACT | Status: DC | PRN

## 2014-07-15 MED ORDER — APIXABAN 5 MG PO TABS
5.0000 mg | ORAL_TABLET | Freq: Two times a day (BID) | ORAL | Status: DC
  Administered 2014-07-15 – 2014-07-17 (×5): 5 mg via ORAL
  Filled 2014-07-15 (×5): qty 1

## 2014-07-15 MED ORDER — ALBUTEROL SULFATE HFA 108 (90 BASE) MCG/ACT IN AERS
2.0000 | INHALATION_SPRAY | Freq: Two times a day (BID) | RESPIRATORY_TRACT | Status: DC | PRN
Start: 2014-07-15 — End: 2014-07-15

## 2014-07-15 MED ORDER — LISINOPRIL-HYDROCHLOROTHIAZIDE 10-12.5 MG PO TABS: 1.0000 | ORAL_TABLET | Freq: Every day | ORAL | Status: DC

## 2014-07-15 MED ORDER — OXYCODONE HCL 5 MG PO TABS
5.0000 mg | ORAL_TABLET | ORAL | Status: DC | PRN
  Administered 2014-07-15 – 2014-07-16 (×4): 5 mg via ORAL
  Filled 2014-07-15 (×4): qty 1

## 2014-07-15 MED ORDER — MORPHINE SULFATE 2 MG/ML IJ SOLN
1.0000 mg | Freq: Once | INTRAMUSCULAR | Status: AC
  Administered 2014-07-15: 1 mg via INTRAVENOUS
  Filled 2014-07-15: qty 1

## 2014-07-15 MED ORDER — ATORVASTATIN CALCIUM 20 MG PO TABS
20.0000 mg | ORAL_TABLET | Freq: Every day | ORAL | Status: DC
  Administered 2014-07-15 – 2014-07-17 (×3): 20 mg via ORAL
  Filled 2014-07-15 (×3): qty 1

## 2014-07-15 MED ORDER — BUDESONIDE-FORMOTEROL FUMARATE 160-4.5 MCG/ACT IN AERO
2.0000 | INHALATION_SPRAY | Freq: Two times a day (BID) | RESPIRATORY_TRACT | Status: DC
  Administered 2014-07-15 – 2014-07-17 (×5): 2 via RESPIRATORY_TRACT
  Filled 2014-07-15: qty 6

## 2014-07-15 MED ORDER — AMLODIPINE BESYLATE 5 MG PO TABS
5.0000 mg | ORAL_TABLET | Freq: Every day | ORAL | Status: DC
  Administered 2014-07-15 – 2014-07-17 (×3): 5 mg via ORAL
  Filled 2014-07-15 (×3): qty 1

## 2014-07-15 MED ORDER — PANTOPRAZOLE SODIUM 40 MG PO TBEC
40.0000 mg | DELAYED_RELEASE_TABLET | Freq: Every day | ORAL | Status: DC
  Administered 2014-07-15 – 2014-07-17 (×3): 40 mg via ORAL
  Filled 2014-07-15 (×3): qty 1

## 2014-07-15 MED ORDER — LISINOPRIL 10 MG PO TABS
10.0000 mg | ORAL_TABLET | Freq: Every day | ORAL | Status: DC
  Administered 2014-07-15 – 2014-07-17 (×3): 10 mg via ORAL
  Filled 2014-07-15 (×3): qty 1

## 2014-07-15 MED ORDER — HYDROCHLOROTHIAZIDE 12.5 MG PO CAPS
12.5000 mg | ORAL_CAPSULE | Freq: Every day | ORAL | Status: DC
  Administered 2014-07-15 – 2014-07-17 (×3): 12.5 mg via ORAL
  Filled 2014-07-15 (×3): qty 1

## 2014-07-15 MED ORDER — POLYETHYLENE GLYCOL 3350 17 G PO PACK
17.0000 g | PACK | Freq: Every day | ORAL | Status: DC | PRN
  Administered 2014-07-17: 17 g via ORAL
  Filled 2014-07-15: qty 1

## 2014-07-15 NOTE — Discharge Instructions (Addendum)
Seizure, Adult A seizure means there is unusual activity in the brain. A seizure can cause changes in attention or behavior. Seizures often cause shaking (convulsions). Seizures often last from 30 seconds to 2 minutes. HOME CARE   If you are given medicines, take them exactly as told by your doctor.  Keep all doctor visits as told.  Do not swim or drive until your doctor says it is okay.  Teach others what to do if you have a seizure. They should:  Lay you on the ground.  Put a cushion under your head.  Loosen any tight clothing around your neck.  Turn you on your side.  Stay with you until you get better. GET HELP RIGHT AWAY IF:   The seizure lasts longer than 2 to 5 minutes.  The seizure is very bad.  The person does not wake up after the seizure.  The person's attention or behavior changes. Drive the person to the emergency room or call your local emergency services (911 in [place] MAKE SURE YOU:   Understand these instructions.  Will watch your condition.  Will get help right away if you are not doing well or get worse. Document Released: 02/02/2008 Document Revised: 11/08/2011 Document Reviewed: 08/04/2011 Exit[employer] Patient Information 2015 [employer]. This information is not intended to replace advice given to you by your health care provider. Make sure you discuss any questions you have with your health care provider.  

## 2014-07-15 NOTE — Progress Notes (Signed)
Patient seen and examined earlier today by my colleague Dr. Patient seen and examined by me, and data base reviewed. Patient presented with shakes/seizure-like activity involving the right side of his body. Complaining also about headache which is required oxycodone and morphine. CT scan of the head done, neurology recommended to repeated in 24 hours.  Clint LippsMutaz A Othello Sgroi Pager: [phone]: [phone] 07/15/2014, 1:31 PM

## 2014-07-15 NOTE — H&P (Signed)
[facility] History and Physical  Dennis HeldJerry Lee Loving NWG:[id number] DOB: 1956-05-26 DOA: 07/14/2014  Referring physician: ER physician. PCP: Dorrene GermanAVBUERE,EDWIN A, MD   Chief Complaint: Right-sided abnormal movements.  HPI: Dennis Brock is a 58 y.o. male who was recently admitted to the hospital for stroke with right-sided weakness and right sided ptosis started experiencing abnormal movements of the right face and upper and lower extremities while watching football last night. This lasted for around 20 minutes and resolved spontaneously. After a few minutes this happened again and patient presented to the ER. CThead did not show anything acute. On-call neurologist Dr. Cyril Mourningamillo has seen the patient and felt that patient may have had seizure-like activities but since patient also is complaining of some increasing weakness on the right side patient has been admitted for further management. Patient states he did smoke some marijuana yesterday. Denies any chest pain or shortness of breath or any palpitations. Has been having pain around the right temporal side.   Review of Systems: As presented in the history of presenting illness, rest negative.  Past Medical History  Diagnosis Date  . Hypertension     amlodipine, lisinopril  . Stroke     was on aspirin before stroke[place]  [place] aspirin.   Marland Kitchen. Hyperlipidemia     zocor 40mg   . Sick sinus syndrome     s/p pacemaker, no defibrillator  . GERD (gastroesophageal reflux disease)   . Chronic pain syndrome   . Seasonal allergies   . Asthma   . Chronic kidney disease   . GERD (gastroesophageal reflux disease)   . Arthritis   . COPD (chronic obstructive pulmonary disease)   . Lumbar disc disease   . Pacemaker    Past Surgical History  Procedure Laterality Date  . Pacemaker insertion    . Lumbar laminectomy      5 back surgeries  . Hand surgery      3 broken bones  . Bunionectomy      right foot   Social History:   reports that he has been smoking Cigarettes.  He has been smoking about 0.00 packs per day. He has never used smokeless tobacco. He reports that he uses illicit drugs (Cocaine). He reports that he does not drink alcohol. Where does patient live home. Can patient participate in ADLs? Yes.  Allergies  Allergen Reactions  . Dilaudid [Hydromorphone] Itching    Family History:  Family History  Problem Relation Age of Onset  . Stroke      grandfather and uncle died of stroke  . Heart attack      uncle  . Cancer Father     throat  . Cancer - Other Father     throat  . Transient ischemic attack Mother     1187 and doing well  . Heart disease Mother   . COPD Mother   . Hypertension Sister   . COPD Sister   . Hypertension Sister   . COPD Sister   . Hypertension Sister   . COPD Sister   . Hypertension Sister   . COPD Sister   . Hypertension Sister   . COPD Sister       Prior to Admission medications   Medication Sig Start Date End Date Taking? Authorizing Provider  albuterol (PROVENTIL HFA;VENTOLIN HFA) 108 (90 BASE) MCG/ACT inhaler Inhale 2 puffs into the lungs 2 (two) times daily as needed for wheezing or shortness of breath.    Yes Historical  Provider, MD  albuterol (PROVENTIL) (2.5 MG/3ML) 0.083% nebulizer solution Take 2.5 mg by nebulization every 6 (six) hours as needed for wheezing or shortness of breath.    Yes Historical Provider, MD  amLODipine (NORVASC) 5 MG tablet Take 1 tablet (5 mg total) by mouth daily. 01/15/14  Yes Duke SalviaSteven C Klein, MD  apixaban (ELIQUIS) 5 MG TABS tablet Take 5 mg by mouth 2 (two) times daily.   Yes Historical Provider, MD  atorvastatin (LIPITOR) 20 MG tablet Take 20 mg by mouth daily.   Yes Historical Provider, MD  budesonide-formoterol (SYMBICORT) 160-4.5 MCG/ACT inhaler Inhale 2 puffs into the lungs daily as needed (shortness of breath).    Yes Historical Provider, MD  lisinopril-hydrochlorothiazide (ZESTORETIC) 10-12.5 MG per tablet Take 1 tablet by  mouth daily. 06/05/14  Yes Calvert CantorSaima Rizwan, MD  Menthol-Methyl Salicylate (MUSCLE RUB EX) Apply 1 application topically daily as needed (back pain).   Yes Historical Provider, MD  omeprazole (PRILOSEC) 20 MG capsule Take 20 mg by mouth daily.   Yes Historical Provider, MD  oxyCODONE (OXY IR/ROXICODONE) 5 MG immediate release tablet Take 1 tablet (5 mg total) by mouth every 4 (four) hours as needed for moderate pain. 06/05/14  Yes Calvert CantorSaima Rizwan, MD  polyethylene glycol (MIRALAX / GLYCOLAX) packet Take 17 g by mouth daily as needed for mild constipation.    Yes Historical Provider, MD  doxycycline (VIBRAMYCIN) 100 MG capsule Take 1 capsule (100 mg total) by mouth 2 (two) times daily. Patient not taking: Reported on 07/14/2014 06/25/14   Shon Batonourtney F Horton, MD  predniSONE (DELTASONE) 20 MG tablet Take 3 tablets (60 mg total) by mouth daily with breakfast. Patient not taking: Reported on 07/14/2014 06/25/14   Shon Batonourtney F Horton, MD    Physical Exam: Filed Vitals:   07/14/14 2217 07/14/14 2231 07/14/14 2232 07/14/14 2356  BP: 175/113 163/105  146/84  Pulse: 80 69 70 63  Temp: 98.1 F (36.7 C)     TempSrc: Oral     Resp: 20 18 20 20   Height: 6' (1.829 m)     Weight: 131.543 kg (290 lb)     SpO2: 100% 96% 92% 95%     General:  Well-developed and nourished.  Eyes: anicteric no pallor. Right eye ptosis.  ENT: no discharge from the ears eyes nose and mouth.  Neck: no mass felt.  Cardiovascular: S1-S2 heard.  Respiratory: no rhonchi or crepitations.  Abdomen: soft nontender bowel sounds present.  Skin: no rash.  Musculoskeletal: no edema.  Psychiatric: appears normal.  Neurologic: alert awake oriented to time place and person. Patient has weakness of the right upper and lower extremities 4 x 5 and has right-sided ptosis. PERRLA positive.  Labs on Admission:  Basic Metabolic Panel:  Recent Labs Lab 07/14/14 2224 07/14/14 2237  NA 140 142  K 4.3 3.7  CL 101 102  CO2 27  --    GLUCOSE 90 90  BUN 19 22  CREATININE 1.44* 1.50*  CALCIUM 9.8  --    Liver Function Tests:  Recent Labs Lab 07/14/14 2224  AST 21  ALT 18  ALKPHOS 98  BILITOT 0.2*  PROT 7.2  ALBUMIN 3.8   No results for input(s): LIPASE, AMYLASE in the last 168 hours. No results for input(s): AMMONIA in the last 168 hours. CBC:  Recent Labs Lab 07/14/14 2224 07/14/14 2237  WBC 5.4  --   NEUTROABS 2.1  --   HGB 15.7 16.7  HCT 46.3 49.0  MCV 89.0  --  PLT 201  --    Cardiac Enzymes: No results for input(s): CKTOTAL, CKMB, CKMBINDEX, TROPONINI in the last 168 hours.  BNP (last 3 results)  Recent Labs  03/08/14 1250 06/03/14 0236 06/25/14 0320  PROBNP 61.1 71.2 158.4*   CBG:  Recent Labs Lab 07/14/14 2231  GLUCAP 79    Radiological Exams on Admission: Ct Head Wo Contrast  07/14/2014   CLINICAL DATA:  Headaches, right-sided facial: And right-sided weakness  EXAM: CT HEAD WITHOUT CONTRAST  TECHNIQUE: Contiguous axial images were obtained from the base of the skull through the vertex without intravenous contrast.  COMPARISON:  06/03/2014  FINDINGS: Bony calvarium is intact. Paranasal sinuses are well aerated. A small amount of fluid is noted within the left mastoid air cells which is stable from the prior exam. Prior lacunar infarct is noted on the right than the recent of basal ganglia and corona radiata which is stable from the prior exam. Prior lacunar infarct in the left thalamus is also noted. No acute hemorrhage, acute infarction or space-occupying mass lesion is noted. Mild chronic white matter ischemic change is noted.  IMPRESSION: Chronic changes without acute abnormality.  These results were called by telephone at the time of interpretation on 07/14/2014 at 10:51 pm to Dr. Leroy Kennedy, who verbally acknowledged these results.   Electronically Signed   By: Alcide Clever M.D.   On: 07/14/2014 22:52    EKG: Independently reviewed. Normal sinus  rhythm.  Assessment/Plan Principal Problem:   Seizure-like activity Active Problems:   Hyperlipidemia   Cardiac pacemaker in situ   Paroxysmal atrial fibrillation   Essential hypertension   1. Seizure-like activities - as this time I have discussed with Dr. Orvan Falconer since patient has pacemaker patient cannot get MRI done and Dr. Ilean Skill as advised repeat CT head in 24 hours from the first one. Check EEG. At this time patient has not been started on any antiseizure medications. 2. Polysubstance abuse including tobacco and cocaine - patient has been advised to quit these habits. Check urine drug screen. 3. Paroxysmal atrial fibrillation and sick sinus syndrome status post pacemaker placement - on Apixaban. 4. COPD - presently not wheezing. 5. History of diastolic CHF - appears compensated. 6. Hypertension - continue home medications. 7. Hyperlipidemia - continue statins.    Code Status: full code.  Family Communication: none.  Disposition Plan: admit for observation.   Trevel Dillenbeck N. [facility] Pager [phone].  If 7PM-7AM, please contact night-coverage [email or web address] Password [facility] 07/15/2014, 12:35 AM

## 2014-07-15 NOTE — Progress Notes (Signed)
Subjective: patient has had no further jerking episodes.  He now complains of a 9/10 HA but is sitting up eating at the side of the bed, watching T.V. And yelling at his girlfriend on the phone. He shows no signs of distress.   Objective: Current vital signs: BP 138/77 mmHg  Pulse 72  Temp(Src) 97.6 F (36.4 C) (Oral)  Resp 18  Ht 6\' 1"  (1.854 m)  Wt 135.807 kg (299 lb 6.4 oz)  BMI 39.51 kg/m2  SpO2 96% Vital signs in last 24 hours: Temp:  [97.6 F (36.4 C)-98.3 F (36.8 C)] 97.6 F (36.4 C) (11/16 1200) Pulse Rate:  [49-80] 72 (11/16 1200) Resp:  [16-20] 18 (11/16 1200) BP: (132-175)/(77-113) 138/77 mmHg (11/16 1200) SpO2:  [90 %-100 %] 96 % (11/16 1200) Weight:  [131.543 kg (290 lb)-135.807 kg (299 lb 6.4 oz)] 135.807 kg (299 lb 6.4 oz) (11/16 0038)  Intake/Output from previous day: 11/15 0701 - 11/16 0700 In: -  Out: 200 [Urine:200] Intake/Output this shift: Total I/O In: 360 [P.O.:360] Out: 225 [Urine:225] Nutritional status: Diet Heart  Neurologic Exam: General: NAD Mental Status: Alert, oriented, thought content appropriate.  Speech fluent without evidence of aphasia.  Able to follow 3 step commands without difficulty. Cranial Nerves: II: Visual fields grossly normal, pupils equal, round, reactive to light and accommodation III,IV, VI: ptosis not present, extra-ocular motions intact bilaterally V,VII: smile symmetric, facial light touch sensation normal bilaterally VIII: hearing normal bilaterally IX,X: gag reflex present XI: bilateral shoulder shrug XII: midline tongue extension without atrophy or fasciculations  Motor: Right : Upper extremity   5/5    Left:     Upper extremity   5/5  Lower extremity   5/5     Lower extremity   5/5 Tone and bulk:normal tone throughout; no atrophy noted Sensory: Pinprick and light touch intact throughout, bilaterally Deep Tendon Reflexes:  Right: Upper Extremity   Left: Upper extremity   biceps (C-5 to C-6) 2/4   biceps  (C-5 to C-6) 2/4 tricep (C7) 2/4    triceps (C7) 2/4 Brachioradialis (C6) 2/4  Brachioradialis (C6) 2/4  Lower Extremity Lower Extremity  quadriceps (L-2 to L-4) 2/4   quadriceps (L-2 to L-4) 2/4 Achilles (S1) 2/4   Achilles (S1) 2/4  Plantars: Right: downgoing   Left: downgoing Cerebellar: normal finger-to-nose,  normal heel-to-shin test    Lab Results: Basic Metabolic Panel:  Recent Labs Lab 07/14/14 2224 07/14/14 2237 07/15/14 0130  NA 140 142 142  K 4.3 3.7 3.8  CL 101 102 103  CO2 27  --  26  GLUCOSE 90 90 123*  BUN 19 22 18   CREATININE 1.44* 1.50* 1.37*  CALCIUM 9.8  --  9.2    Liver Function Tests:  Recent Labs Lab 07/14/14 2224 07/15/14 0130  AST 21 17  ALT 18 19  ALKPHOS 98 92  BILITOT 0.2* <0.2*  PROT 7.2 6.2  ALBUMIN 3.8 3.6   No results for input(s): LIPASE, AMYLASE in the last 168 hours. No results for input(s): AMMONIA in the last 168 hours.  CBC:  Recent Labs Lab 07/14/14 2224 07/14/14 2237 07/15/14 0130  WBC 5.4  --  4.3  NEUTROABS 2.1  --  1.7  HGB 15.7 16.7 14.5  HCT 46.3 49.0 43.6  MCV 89.0  --  89.3  PLT 201  --  183    Cardiac Enzymes: No results for input(s): CKTOTAL, CKMB, CKMBINDEX, TROPONINI in the last 168 hours.  Lipid Panel: No results for  input(s): CHOL, TRIG, HDL, CHOLHDL, VLDL, LDLCALC in the last 168 hours.  CBG:  Recent Labs Lab 07/14/14 2231 07/15/14 0750 07/15/14 1209  GLUCAP 79 94 122*    Microbiology: No results found for this or any previous visit.  Coagulation Studies:  Recent Labs  07/14/14 2224  LABPROT 13.6  INR 1.03    Imaging: Ct Head Wo Contrast  07/14/2014   CLINICAL DATA:  Headaches, right-sided facial: And right-sided weakness  EXAM: CT HEAD WITHOUT CONTRAST  TECHNIQUE: Contiguous axial images were obtained from the base of the skull through the vertex without intravenous contrast.  COMPARISON:  06/03/2014  FINDINGS: Bony calvarium is intact. Paranasal sinuses are well  aerated. A small amount of fluid is noted within the left mastoid air cells which is stable from the prior exam. Prior lacunar infarct is noted on the right than the recent of basal ganglia and corona radiata which is stable from the prior exam. Prior lacunar infarct in the left thalamus is also noted. No acute hemorrhage, acute infarction or space-occupying mass lesion is noted. Mild chronic white matter ischemic change is noted.  IMPRESSION: Chronic changes without acute abnormality.  These results were called by telephone at the time of interpretation on 07/14/2014 at 10:51 pm to Dr. Leroy Kennedyamilo, who verbally acknowledged these results.   Electronically Signed   By: Alcide CleverMark  Lukens M.D.   On: 07/14/2014 22:52    Medications:  Scheduled: . amLODipine  5 mg Oral Daily  . apixaban  5 mg Oral BID  . atorvastatin  20 mg Oral Daily  . budesonide-formoterol  2 puff Inhalation BID  . hydrochlorothiazide  12.5 mg Oral Daily  . [START ON 07/16/2014] Influenza vac split quadrivalent PF  0.5 mL Intramuscular Tomorrow-1000  . lisinopril  10 mg Oral Daily  .  morphine injection  1 mg Intravenous Once  . pantoprazole  40 mg Oral Daily   Continuous: . sodium chloride     EAV:[id number]:acetaminophen, albuterol, oxyCODONE, polyethylene glycol  Assessment/Plan: 58 y.o. male presents with complains of uncontrollable twitching/jerkiness/heaviness right arm-face--not present at current time. Currently complaining of 9/10 HA but in no distress and eating lunch while yelling at girlfriend on phone.   Recommend: 1) EEG 2) Repeat head CT tonight       Felicie MornDavid Cozetta Seif PA-C [facility] [phone]  07/15/2014, 12:27 PM

## 2014-07-15 NOTE — Plan of Care (Signed)
Problem: Consults Goal: Ischemic Stroke Patient Education See Patient Education Module for education specifics. Outcome: Progressing Goal: Skin Care Protocol Initiated - if Braden Score 18 or less If consults are not indicated, leave blank or document N/A Outcome: Not Applicable Date Met:  88/87/57 Goal: Nutrition Consult-if indicated Outcome: Progressing Goal: Diabetes Guidelines if Diabetic/Glucose > 140 If diabetic or lab glucose is > 140 mg/dl - Initiate Diabetes/Hyperglycemia Guidelines & Document Interventions  Outcome: Not Applicable Date Met:  97/28/20  Problem: Acute Treatment Outcomes Goal: Neuro exam at baseline or improved Outcome: Progressing Goal: BP within ordered parameters Outcome: Completed/Met Date Met:  07/15/14 Goal: Airway maintained/protected Outcome: Completed/Met Date Met:  07/15/14 Goal: 02 Sats > 94% Outcome: Completed/Met Date Met:  07/15/14 Goal: Hemodynamically stable Outcome: Completed/Met Date Met:  07/15/14 Goal: Prognosis discussed with family/patient as appropriate Outcome: Completed/Met Date Met:  07/15/14 Goal: Other Acute Treatment Outcomes Outcome: Progressing  Problem: Progression Outcomes Goal: Communication method established Outcome: Completed/Met Date Met:  07/15/14 Goal: Tolerating diet/TF at goal rate Outcome: Completed/Met Date Met:  07/15/14 Goal: Pain controlled Outcome: Progressing Goal: Bowel & Bladder Continence Outcome: Completed/Met Date Met:  07/15/14 Goal: Educational plan initiated Outcome: Completed/Met Date Met:  07/15/14

## 2014-07-15 NOTE — Procedures (Signed)
ELECTROENCEPHALOGRAM REPORT  Patient: Dennis Brock       Room #: 4U983W07 EEG No. ID: [id number] Age: 58 y.o.        Sex: male Referring Physician: Ahmed PrimaELMAHI, M Report Date:  07/15/2014        Interpreting Physician: Aline BrochureSTEWART,Nevaen Tredway R  History: Dennis HeldJerry Lee Brock is an 58 y.o. male history of hypertension, sick sinus syndrome and lateral brainstem stroke, presenting with exacerbation of right-sided weakness and twitching movements of face and upper extremities.  Indications for study:  Rule out focal seizure disorder.  Technique: This is an 18 channel routine scalp EEG performed at the bedside with bipolar and monopolar montages arranged in accordance to the international 10/20 system of electrode placement.   Description: This EEG was recorded during wakefulness and during brief periods of sleep. Predominant background activity during wakefulness consisted of 9 Hz symmetrical alpha rhythm with good attenuation with eye opening. Photic stimulation and hyperventilation were not performed. Symmetrical sleep spindles and arousal responses were recorded during brief periods of sleep. No epileptiform discharges were seen during wakefulness nor during sleep.  Interpretation: This EEG recording was normal with no evidence of seizure disorder demonstrated.   Venetia MaxonR Sol Englert M.D. [facility] [phone]

## 2014-07-15 NOTE — Progress Notes (Signed)
EEG completed; results pending.    

## 2014-07-15 NOTE — Progress Notes (Signed)
UR completed 

## 2014-07-15 NOTE — Care Management Note (Addendum)
    Page 1 of 2   07/17/2014     11:29:20 AM CARE MANAGEMENT NOTE 07/17/2014  Patient:  Dennis Brock,Dennis Brock   Account Number:  [id number]  Date Initiated:  07/15/2014  Documentation initiated by:  GRAVES-BIGELOW,Joyclyn Plazola  Subjective/Objective Assessment:   Pt admitted for R sided weakness.     Action/Plan:   No needs identified from CM at this time. CSW to speak to pt in ref to polysubstance abuse.   Anticipated DC Date:  07/16/2014   Anticipated DC Plan:  HOME/SELF CARE      DC Planning Services  CM consult      PAC Choice  DURABLE MEDICAL EQUIPMENT   Choice offered to / List presented to:  [name]   DME arranged  Dan HumphreysWALKER      DME agency  [facility]        Status of service:  Completed, signed off Medicare Important Message given?  NO (If response is "NO", the following Medicare IM given date fields will be blank) Date Medicare IM given:   Medicare IM given by:   Date Additional Medicare IM given:   Additional Medicare IM given by:    Discharge Disposition:  HOME/SELF CARE  Per UR Regulation:  Reviewed for med. necessity/level of care/duration of stay  If discussed at Long Length of Stay Meetings, dates discussed:    Comments:   07-17-14 1125 Tomi BambergerBrenda Graves-Bigelow, RN,BSN [phone] Pt now agreeable to Outpatient Services per PT. CM did place a EPIC referral for services and DME RW to be delivered to room before d/c.   07-17-14 1024 Tomi BambergerBrenda Graves-Bigelow, RN,BSN [phone] CM did touch base with pt in regards to disposition needs. [facility] CM did offer to pt that if he has continued weakness from previous stroke, he could be a candidate for outpatient PT services at [facility]. Pt states he will not need this service at this time. No further needs from CM.

## 2014-07-16 ENCOUNTER — Encounter: Payer: Self-pay | Admitting: *Deleted

## 2014-07-16 DIAGNOSIS — I6330 Cerebral infarction due to thrombosis of unspecified cerebral artery: Secondary | ICD-10-CM

## 2014-07-16 DIAGNOSIS — J449 Chronic obstructive pulmonary disease, unspecified: Secondary | ICD-10-CM | POA: Diagnosis present

## 2014-07-16 DIAGNOSIS — I69351 Hemiplegia and hemiparesis following cerebral infarction affecting right dominant side: Secondary | ICD-10-CM | POA: Diagnosis not present

## 2014-07-16 DIAGNOSIS — G43909 Migraine, unspecified, not intractable, without status migrainosus: Secondary | ICD-10-CM | POA: Diagnosis present

## 2014-07-16 DIAGNOSIS — M1990 Unspecified osteoarthritis, unspecified site: Secondary | ICD-10-CM | POA: Diagnosis present

## 2014-07-16 DIAGNOSIS — Z23 Encounter for immunization: Secondary | ICD-10-CM | POA: Diagnosis not present

## 2014-07-16 DIAGNOSIS — Z792 Long term (current) use of antibiotics: Secondary | ICD-10-CM | POA: Diagnosis not present

## 2014-07-16 DIAGNOSIS — K219 Gastro-esophageal reflux disease without esophagitis: Secondary | ICD-10-CM | POA: Diagnosis present

## 2014-07-16 DIAGNOSIS — I480 Paroxysmal atrial fibrillation: Secondary | ICD-10-CM | POA: Diagnosis present

## 2014-07-16 DIAGNOSIS — N189 Chronic kidney disease, unspecified: Secondary | ICD-10-CM | POA: Diagnosis present

## 2014-07-16 DIAGNOSIS — R519 Headache, unspecified: Secondary | ICD-10-CM | POA: Diagnosis present

## 2014-07-16 DIAGNOSIS — Z720 Tobacco use: Secondary | ICD-10-CM | POA: Diagnosis not present

## 2014-07-16 DIAGNOSIS — E785 Hyperlipidemia, unspecified: Secondary | ICD-10-CM | POA: Diagnosis present

## 2014-07-16 DIAGNOSIS — R51 Headache: Secondary | ICD-10-CM

## 2014-07-16 DIAGNOSIS — Z7902 Long term (current) use of antithrombotics/antiplatelets: Secondary | ICD-10-CM | POA: Diagnosis not present

## 2014-07-16 DIAGNOSIS — Z7951 Long term (current) use of inhaled steroids: Secondary | ICD-10-CM | POA: Diagnosis not present

## 2014-07-16 DIAGNOSIS — Z79899 Other long term (current) drug therapy: Secondary | ICD-10-CM | POA: Diagnosis not present

## 2014-07-16 DIAGNOSIS — G894 Chronic pain syndrome: Secondary | ICD-10-CM | POA: Diagnosis present

## 2014-07-16 DIAGNOSIS — R569 Unspecified convulsions: Secondary | ICD-10-CM | POA: Diagnosis not present

## 2014-07-16 DIAGNOSIS — Z950 Presence of cardiac pacemaker: Secondary | ICD-10-CM | POA: Diagnosis not present

## 2014-07-16 DIAGNOSIS — I129 Hypertensive chronic kidney disease with stage 1 through stage 4 chronic kidney disease, or unspecified chronic kidney disease: Secondary | ICD-10-CM | POA: Diagnosis present

## 2014-07-16 LAB — GLUCOSE, CAPILLARY
GLUCOSE-CAPILLARY: 108 mg/dL — AB (ref 70–99)
GLUCOSE-CAPILLARY: 108 mg/dL — AB (ref 70–99)
Glucose-Capillary: 110 mg/dL — ABNORMAL HIGH (ref 70–99)
Glucose-Capillary: 139 mg/dL — ABNORMAL HIGH (ref 70–99)

## 2014-07-16 LAB — BASIC METABOLIC PANEL
Anion gap: 13 (ref 5–15)
BUN: 12 mg/dL (ref 6–23)
CHLORIDE: 100 meq/L (ref 96–112)
CO2: 25 mEq/L (ref 19–32)
Calcium: 9.1 mg/dL (ref 8.4–10.5)
Creatinine, Ser: 1.34 mg/dL (ref 0.50–1.35)
GFR calc Af Amer: 66 mL/min — ABNORMAL LOW (ref >90)
GFR calc non Af Amer: 57 mL/min — ABNORMAL LOW (ref >90)
GLUCOSE: 94 mg/dL (ref 70–99)
POTASSIUM: 4.1 meq/L (ref 3.7–5.3)
Sodium: 138 mEq/L (ref 137–147)

## 2014-07-16 MED ORDER — DEXTROSE 5 % IV SOLN
1000.0000 mg | Freq: Once | INTRAVENOUS | Status: AC
  Administered 2014-07-16: 1000 mg via INTRAVENOUS
  Filled 2014-07-16: qty 10

## 2014-07-16 MED ORDER — KETOROLAC TROMETHAMINE 30 MG/ML IJ SOLN: 30.0000 mg | Freq: Once | INTRAMUSCULAR | Status: DC

## 2014-07-16 MED ORDER — DIVALPROEX SODIUM 500 MG PO DR TAB
500.0000 mg | DELAYED_RELEASE_TABLET | Freq: Three times a day (TID) | ORAL | Status: DC
  Administered 2014-07-16 – 2014-07-17 (×3): 500 mg via ORAL
  Filled 2014-07-16 (×3): qty 1

## 2014-07-16 MED ORDER — DIPHENHYDRAMINE HCL 50 MG/ML IJ SOLN
12.5000 mg | Freq: Once | INTRAMUSCULAR | Status: AC
  Administered 2014-07-16: 12.5 mg via INTRAVENOUS
  Filled 2014-07-16: qty 1

## 2014-07-16 MED ORDER — PROCHLORPERAZINE EDISYLATE 5 MG/ML IJ SOLN
10.0000 mg | Freq: Once | INTRAMUSCULAR | Status: AC
  Administered 2014-07-16: 10 mg via INTRAVENOUS
  Filled 2014-07-16: qty 2

## 2014-07-16 MED ORDER — BUTALBITAL-APAP-CAFFEINE 50-325-40 MG PO TABS
1.0000 | ORAL_TABLET | Freq: Once | ORAL | Status: AC
  Administered 2014-07-16: 1 via ORAL
  Filled 2014-07-16: qty 1

## 2014-07-16 MED ORDER — DIPHENHYDRAMINE HCL 25 MG PO CAPS: 25.0000 mg | ORAL_CAPSULE | Freq: Once | ORAL | Status: DC

## 2014-07-16 NOTE — Progress Notes (Signed)
[facility] PROGRESS NOTE   Dennis MageJerry Brock [facility] ZOX:[id number] DOB: Dec 14, 1955 DOA: 07/14/2014 PCP: Dorrene GermanAVBUERE,EDWIN A, MD  HPI/Subjective: Patient complains about severe headache in the right side of back of his head. Repeat CT scan did not show any acute events.  Assessment/Plan: Principal Problem:   Seizure-like activity Active Problems:   Hyperlipidemia   Cardiac pacemaker in situ   Paroxysmal atrial fibrillation   Essential hypertension   Cerebral thrombosis with cerebral infarction    Seizure-like activity Patient seen by neurology, MRI cannot be done because of pacemaker in situ. CT scan was done and repeated after 24 hours showed no acute events. Patient denies any seizure like activity in the hospital, normal EEG. No further workup.  Headache Patient complains about throbbing headache 9/10 in the right side of the back of his head. Started on Depakote by neurology, Reglan and Compazine as well.  Paroxysmal atrial fibrillation Rate is controlled, currently sinus rhythm. Patient is on Xarleto  Polysubstance abuse History of cocaine and cannabis abuse, his UDS is positive for cocaine. Patient counseled extensively, CSW for counseling as well.  HTN Continue home medications, stable.   Code Status: full code Family Communication: Plan discussed with the patient. Disposition Plan: Remains inpatient   Consultants:  none  Procedures:  none  Antibiotics:  none   Objective: Filed Vitals:   07/16/14 1200  BP: 141/79  Pulse:   Temp: 97.8 F (36.6 C)  Resp: 18    Intake/Output Summary (Last 24 hours) at 07/16/14 1435 Last data filed at 07/16/14 1300  Gross per 24 hour  Intake   1540 ml  Output   3650 ml  Net  -2110 ml   Filed Weights   07/14/14 2217 07/15/14 0038 07/16/14 0400  Weight: 131.543 kg (290 lb) 135.807 kg (299 lb 6.4 oz) 136.986 kg (302 lb)    Exam: General: Alert and awake, oriented x3, not in any acute distress. HEENT:  anicteric sclera, pupils reactive to light and accommodation, EOMI CVS: S1-S2 clear, no murmur rubs or gallops Chest: clear to auscultation bilaterally, no wheezing, rales or rhonchi Abdomen: soft nontender, nondistended, normal bowel sounds, no organomegaly Extremities: no cyanosis, clubbing or edema noted bilaterally Neuro: Cranial nerves II-XII intact, no focal neurological deficits  Data Reviewed: Basic Metabolic Panel:  Recent Labs Lab 07/14/14 2224 07/14/14 2237 07/15/14 0130 07/16/14 0408  NA 140 142 142 138  K 4.3 3.7 3.8 4.1  CL 101 102 103 100  CO2 27  --  26 25  GLUCOSE 90 90 123* 94  BUN 19 22 18 12   CREATININE 1.44* 1.50* 1.37* 1.34  CALCIUM 9.8  --  9.2 9.1   Liver Function Tests:  Recent Labs Lab 07/14/14 2224 07/15/14 0130  AST 21 17  ALT 18 19  ALKPHOS 98 92  BILITOT 0.2* <0.2*  PROT 7.2 6.2  ALBUMIN 3.8 3.6   No results for input(s): LIPASE, AMYLASE in the last 168 hours. No results for input(s): AMMONIA in the last 168 hours. CBC:  Recent Labs Lab 07/14/14 2224 07/14/14 2237 07/15/14 0130  WBC 5.4  --  4.3  NEUTROABS 2.1  --  1.7  HGB 15.7 16.7 14.5  HCT 46.3 49.0 43.6  MCV 89.0  --  89.3  PLT 201  --  183   Cardiac Enzymes: No results for input(s): CKTOTAL, CKMB, CKMBINDEX, TROPONINI in the last 168 hours. BNP (last 3 results)  Recent Labs  03/08/14 1250 06/03/14 0236 06/25/14 0320  PROBNP 61.1 71.2 158.4*  CBG:  Recent Labs Lab 07/15/14 1209 07/15/14 1639 07/15/14 2057 07/16/14 0752 07/16/14 1137  GLUCAP 122* 87 109* 108* 110*    Micro No results found for this or any previous visit (from the past 240 hour(s)).   Studies: Ct Head Wo Contrast  07/16/2014   CLINICAL DATA:  Headache, seizure, right-sided numbness and tingling.  EXAM: CT HEAD WITHOUT CONTRAST  TECHNIQUE: Contiguous axial images were obtained from the base of the skull through the vertex without intravenous contrast.  COMPARISON:  Prior CT from  07/14/2014  FINDINGS: Patchy hypodensity within the periventricular and deep white matter both cerebral hemispheres again noted, most compatible with chronic small vessel ischemic changes. Remote lacunar infarcts involving the right caudate head extending into the right corona radiata again noted, stable. Additional remote lacunar infarct within the posterior limb of the left internal capsule is also unchanged.  No acute large vessel territory infarct. No intracranial hemorrhage. No mass lesion or midline shift. No hydrocephalus. No extra-axial fluid collection.  Scalp soft tissues within normal limits. Calvarium intact. No acute abnormality seen about the orbits.  Paranasal sinuses are clear. Scattered opacity within the left mastoid air cells is stable.  IMPRESSION: 1. No acute intracranial abnormality identified. 2. Remote lacunar infarcts involving the bilateral basal ganglia with chronic small vessel ischemic disease, stable.   Electronically Signed   By: Rise MuBenjamin  McClintock M.D.   On: 07/16/2014 00:53   Ct Head Wo Contrast  07/14/2014   CLINICAL DATA:  Headaches, right-sided facial: And right-sided weakness  EXAM: CT HEAD WITHOUT CONTRAST  TECHNIQUE: Contiguous axial images were obtained from the base of the skull through the vertex without intravenous contrast.  COMPARISON:  06/03/2014  FINDINGS: Bony calvarium is intact. Paranasal sinuses are well aerated. A small amount of fluid is noted within the left mastoid air cells which is stable from the prior exam. Prior lacunar infarct is noted on the right than the recent of basal ganglia and corona radiata which is stable from the prior exam. Prior lacunar infarct in the left thalamus is also noted. No acute hemorrhage, acute infarction or space-occupying mass lesion is noted. Mild chronic white matter ischemic change is noted.  IMPRESSION: Chronic changes without acute abnormality.  These results were called by telephone at the time of interpretation on  07/14/2014 at 10:51 pm to Dr. Leroy Kennedyamilo, who verbally acknowledged these results.   Electronically Signed   By: Alcide CleverMark  Lukens M.D.   On: 07/14/2014 22:52    Scheduled Meds: . amLODipine  5 mg Oral Daily  . apixaban  5 mg Oral BID  . atorvastatin  20 mg Oral Daily  . budesonide-formoterol  2 puff Inhalation BID  . diphenhydrAMINE  25 mg Oral Once  . divalproex  500 mg Oral 3 times per day  . hydrochlorothiazide  12.5 mg Oral Daily  . lisinopril  10 mg Oral Daily  . pantoprazole  40 mg Oral Daily   Continuous Infusions: . sodium chloride         Time spent: 35 minutes    [facility] - An[facility]ELMAHI,Beyounce Dickens A  [facility] Pager [phone] If 7PM-7AM, please contact night-coverage at [email or web address], password [email or web address] 07/16/2014, 2:35 PM  LOS: 2 days

## 2014-07-16 NOTE — Progress Notes (Signed)
UR completed 

## 2014-07-16 NOTE — Progress Notes (Signed)
Subjective: No further seizures. He does complain of throbbing photophobic headache. He states that this is similar to previous headaches.   Exam: Filed Vitals:   07/16/14 0802  BP: 144/85  Pulse: 54  Temp: 97.9 F (36.6 C)  Resp: 18   Gen: In bed, NAD MS: Awake, alert, interactive and appropriate ZO:XWRUECN:PERRL, EOMI Motor: mild right hemiparesis.  Sensory:intact to LT   Impression: 58 yo M with event which from history sounds consistent with seizure. I would favor starting depakote. His right sided deficits are likely unmasking of his recent stroke deficits. The description of his headaceh sounds migrainous in nature.   Recommendations: 1) Depakote 500mg  TID 2) compazine+benadryl for migraine.  3) will continue to follow.   Ritta SlotMcNeill Ludmila Ebarb, MD [facility] [phone]  If 7pm- 7am, please page neurology on call as listed in AMION.

## 2014-07-17 DIAGNOSIS — F1910 Other psychoactive substance abuse, uncomplicated: Secondary | ICD-10-CM

## 2014-07-17 DIAGNOSIS — G43909 Migraine, unspecified, not intractable, without status migrainosus: Secondary | ICD-10-CM | POA: Diagnosis present

## 2014-07-17 DIAGNOSIS — R569 Unspecified convulsions: Secondary | ICD-10-CM | POA: Diagnosis not present

## 2014-07-17 DIAGNOSIS — N179 Acute kidney failure, unspecified: Secondary | ICD-10-CM

## 2014-07-17 LAB — GLUCOSE, CAPILLARY
Glucose-Capillary: 99 mg/dL (ref 70–99)
Glucose-Capillary: 137 mg/dL — ABNORMAL HIGH (ref 70–99)
Glucose-Capillary: 93 mg/dL (ref 70–99)

## 2014-07-17 MED ORDER — TRAMADOL-ACETAMINOPHEN 37.5-325 MG PO TABS: 1.0000 | ORAL_TABLET | Freq: Four times a day (QID) | ORAL | Status: DC | PRN

## 2014-07-17 MED ORDER — LEVETIRACETAM 500 MG PO TABS: 500.0000 mg | ORAL_TABLET | Freq: Two times a day (BID) | ORAL | Status: DC

## 2014-07-17 MED ORDER — LEVETIRACETAM 500 MG PO TABS
500.0000 mg | ORAL_TABLET | Freq: Two times a day (BID) | ORAL | Status: DC
  Administered 2014-07-17: 500 mg via ORAL
  Filled 2014-07-17: qty 1

## 2014-07-17 NOTE — Evaluation (Signed)
Occupational Therapy Evaluation Patient Details Name: Dennis HeldJerry Lee Altmann MRN: [id number] DOB: 1956/02/12 Today's Date: 07/17/2014    History of Present Illness Dennis Brock is a 58 y.o. male with history of paroxysmal atrial fibrillation and sick sinus syndrome status post pacemaker placement, substance abuse including cocaine, COPD, previous stroke, HTN, HLD presented the ER because of right upper and lower extremity weakness.  CT shows chronic changes of deep white matter on R, stable lenticulostriate infarct, no acute occlusions or infarcts.   Clinical Impression   Pt  Admitted with above. Pt independent with ADLs, PTA. Feel pt will benefit from acute OT to increase independence and safety prior to d/c. Recommending 24/7 assistance/supervision and Outpatient OT for d/c.     Follow Up Recommendations  Outpatient OT;Supervision/Assistance - 24 hour    Equipment Recommendations  3 in 1 bedside comode    Recommendations for Other Services       Precautions / Restrictions Precautions Precautions: Fall Restrictions Weight Bearing Restrictions: No      Mobility Bed Mobility Overal bed mobility: Modified Independent                Transfers Overall transfer level: Needs assistance   Transfers: Sit to/from Stand Sit to Stand: Min guard         General transfer comment: Min guard for safety.    Balance                                            ADL Overall ADL's : Needs assistance/impaired                     Lower Body Dressing: Min guard;Sit to/from stand   Toilet Transfer: Min guard;Ambulation;RW (bed)       Tub/ Shower Transfer: Minimal assistance;Ambulation (practiced stepping over simulated tub)   Functional mobility during ADLs: Min guard;Rolling walker-took a few steps without walker  General ADL Comments: Educated on BE FAST stroke education and importance of getting help right away. Recommended avoiding canned foods  and to use frozen veggies instead. Educated on safety (safe shoewear, rugs, sitting for most of LB ADLs and having walker in front when pulling up LB clothing). Instructed pt to not get in tub alone. Practiced simulated tub transfer. Spoke with fiance on phone and recommended someone be with pt for a little while-pt not steady. Educated on energy conservation techniques. Educated on 3 in 1. Recommended pt avoid sharp/dangerous objects and hot things due to decreased sensation in RUE. Told pt d/c recommendations.      Vision  Reports blurry vision                   Perception     Praxis      Pertinent Vitals/Pain Pain Assessment: Faces Faces Pain Scale: Hurts even more Pain Location: head Pain Descriptors / Indicators: Aching Pain Intervention(s): Monitored during session     Hand Dominance Right   Extremity/Trunk Assessment Upper Extremity Assessment Upper Extremity Assessment: RUE deficits/detail RUE Deficits / Details: grossly 4-/5 shoulder flexion (some resistance) RUE Sensation: decreased light touch   Lower Extremity Assessment Lower Extremity Assessment: Defer to PT evaluation       Communication Communication Communication: No difficulties   Cognition Arousal/Alertness: Awake/alert Behavior During Therapy: WFL for tasks assessed/performed Overall Cognitive Status:  (unsure of baseline) Area of Impairment: Safety/judgement  Safety/Judgement: Decreased awareness of safety         General Comments       Exercises       Shoulder Instructions      Home Living Family/patient expects to be discharged to:: Private residence Living Arrangements: Non-relatives/Friends Available Help at Discharge: Family;Friend(s) Type of Home: House Home Access: Stairs to enter Entergy CorporationEntrance Stairs-Number of Steps: 3 Entrance Stairs-Rails: Right Home Layout: One level     Bathroom Shower/Tub: Tub/shower unit;Curtain Shower/tub characteristics:  Engineer, building servicesCurtain Bathroom Toilet: Standard     Home Equipment: None          Prior Functioning/Environment Level of Independence: Independent             OT Diagnosis: Acute pain;Generalized weakness   OT Problem List: Decreased strength;Decreased activity tolerance;Impaired balance (sitting and/or standing);Pain;Decreased knowledge of use of DME or AE;Decreased knowledge of precautions;Decreased safety awareness;Impaired vision/perception   OT Treatment/Interventions: Self-care/ADL training;Therapeutic exercise;DME and/or AE instruction;Therapeutic activities;Patient/family education;Balance training;Visual/perceptual remediation/compensation    OT Goals(Current goals can be found in the care plan section) Acute Rehab OT Goals Patient Stated Goal: not stated OT Goal Formulation: With patient Time For Goal Achievement: 07/24/14 Potential to Achieve Goals: Good ADL Goals Pt Will Transfer to Toilet: with modified independence;ambulating Pt Will Perform Tub/Shower Transfer: Tub transfer;with modified independence;ambulating;3 in 1;rolling walker Additional ADL Goal #1: Pt will independently perform HEP for RUE to increase strength.  OT Frequency: Min 2X/week   Barriers to D/C:            Co-evaluation              End of Session Equipment Utilized During Treatment: Gait belt;Rolling walker Nurse Communication: Other (comment);Mobility status (equipment and d/c recommendations)  Activity Tolerance: Patient tolerated treatment well Patient left: in bed;with call bell/phone within reach;with bed alarm set   Time: 1511-1536 OT Time Calculation (min): 25 min Charges:  OT General Charges $OT Visit: 1 Procedure OT Evaluation $Initial OT Evaluation Tier I: 1 Procedure OT Treatments $Self Care/Home Management : 8-22 mins G-Codes: OT G-codes **NOT FOR INPATIENT CLASS** Functional Assessment Tool Used: clinical judgment Functional Limitation: Self care Self Care Current  Status (Z6109(G8987): At least 1 percent but less than 20 percent impaired, limited or restricted Self Care Goal Status (U0454(G8988): 0 percent impaired, limited or restricted  Dennis Brock, Dennis Brock L OTR/L [phone] 07/17/2014, 4:53 PM

## 2014-07-17 NOTE — Progress Notes (Signed)
Transitional Care Clinic Care Coordination Note:  Admit date: 07/14/2014  Discharge date:  ? 07/17/2014 Discharge Disposition: Home  Patient contact information:  [phone] (Yvette-patient's girlfriend)  This Case Manager reviewed patient's EMR and determined patient would benefit from chronic care management services through the [facility]. Patient has had * 3 hospital admissions in the last six months and 5 ED visits. Patient has a history of hyperlipidemia, paroxysmal atrial fibrillation, essential hypertension, cerebral thrombosis with cerebral infarction; patient receiving treatment for seizure-like activity and headache. This Case Manager met with patient to discuss the services and medical management that can be provided at the [facility].  Written consent obtained for Transitional Care Clinic follow-up upon discharge.  Patient aware he will receive a post-discharge transition of care phone call within 24-48 hours of discharge and will be evaluated for close follow-up for up to 30 days following discharge. Transitional Care Clinic appointment scheduled for 07/22/2014 at 1130.  Explained that [place] Management services does not replace or interfere with any services that are arranged by inpatient case management or social work. For additional questions or referrals please contact [place] at [phone]     Consent for Chronic Care Management for [facility] signed by patient.  Patient scheduled for Transitional Care appointment on 07/22/14 at 1130.  Appointment time on AVS.  Clinic information and appointment time provided to patient.    Assessment:       Home Environment: Patient lives in a private residence with a roommate.       Support System: Patient's girlfriend and sister provide support.       Level of functioning: Patient is independent with daily activities; although he does have a walker that he uses as needed for  mobility.       Home DME: walker, home nebulizer       Home care services: none       Transportation: Patient uses his sister's vehicle to get to appointments.        Medications: Patient indicates he has prescription drug coverage; however, he sometimes cannot afford his medication co-pays because he is on a limited, fixed income.  Spoke with [facility] who indicates patient able to obtain needed medications at [facility] upon discharge.  Patient aware. Patient also indicates he often does not have enough money for clothing or food. Patient indicates he receives $58.00/month in food stamps/month.  After he has used his monthly food stamps he relies on his sister for meals.  Jacqlyn Krauss, RN Case Manager notified. She indicates she will discuss with inpatient Social Worker so additional resources can be given to patient.  In addition, appointment made with [facility] Social Worker to discuss community food, clothing, and housing resources.          Arranged services:        Services communicated to Jacqlyn Krauss, RN Case Manager   Patient verbalizes understanding of [facility] services and aware of appointment time/location.

## 2014-07-17 NOTE — Evaluation (Signed)
Physical Therapy Evaluation Patient Details Name: Dennis Brock MRN: [id number] DOB: 1955-11-26 Today's Date: 07/17/2014   History of Present Illness  Dennis Brock is a 58 y.o. male with history of paroxysmal atrial fibrillation and sick sinus syndrome status post pacemaker placement, substance abuse including cocaine, COPD, previous stroke, HTN, HLD presented the ER because of right upper and lower extremity weakness.  CT shows chronic changes of deep Dennis Brock matter on R, stable lenticulostriate infarct, no acute occlusions or infarcts.  Clinical Impression   Pt currently with functional limitations due to impairments listed below. Pt will benefit from skilled PT to increase independence and safety with mobility to allow discharge to home with outpatient PT to address impairments and gaze stability deficits. Pt able to tolerate ambulation using RW well, with no increase in R hip pain during ambulation. Pt SaO2 remained between 90-95% throughout treatment session. Pt was quick with spontaneous movements during ambulation which suggest decreased awareness of safety and deficits. Pt benefited from use of RW with ambulation to assist in decreasing weight shift and stance time on Right LE. Pt will benefit from continued therapy to address gaze stability of Right eye to decreased complaints of blurry vision.     Follow Up Recommendations Outpatient PT    Equipment Recommendations  Rolling walker with 5" wheels    Recommendations for Other Services       Precautions / Restrictions Precautions Precautions: Fall Restrictions Weight Bearing Restrictions: No      Mobility  Bed Mobility                  Transfers Overall transfer level: Needs assistance Equipment used: Rolling walker (2 wheeled) Transfers: Sit to/from Stand Sit to Stand: Min guard         General transfer comment: Pt able to stand up from recliner chair using his arms without physical assistance from PT. PT  gave cues for sequencing and positioning during transfer. Pt required min guard for safety due to Pt moving quickly and uncomfortable putting weight through Right LE.   Ambulation/Gait Ambulation/Gait assistance: Min guard Ambulation Distance (Feet): 100 Feet Assistive device: Rolling walker (2 wheeled) Gait Pattern/deviations: Step-through pattern;Decreased stride length;Decreased weight shift to right;Decreased stance time - right   Gait velocity interpretation: Below normal speed for age/gender General Gait Details: Pt willing to ambulate in hallway using RW. Pt not weight shifting normally due to pain in Right hip so exhibited decreased stance time on Right. Pt relied heavily on RW for support during ambulation. Pt took 3 rest breaks during ambulation in the hall and his SaO2 stayed between 93-95%. Pt was quick and spontaneous with movements during ambulation and showed decreased awareness of safety at times.   Stairs            Wheelchair Mobility    Modified Rankin (Stroke Patients Only)       Balance Overall balance assessment: Needs assistance Sitting-balance support: Feet supported;No upper extremity supported Sitting balance-Leahy Scale: Fair Sitting balance - Comments: Pt able to lean forward to don and doff socks while sitting in recliner chair.    Standing balance support: During functional activity;Bilateral upper extremity supported Standing balance-Leahy Scale: Poor Standing balance comment: Pt unable to stand without support due to pain in Right LE                             Pertinent Vitals/Pain Pain Assessment: 0-10 Pain Score: 7  Pain Location: Right hip Pain Intervention(s): Monitored during session;Limited activity within patient's tolerance    Home Living Family/patient expects to be discharged to:: Private residence Living Arrangements: Non-relatives/Friends Available Help at Discharge: Family;Friend(s) Type of Home: House Home  Access: Stairs to enter Entrance Stairs-Rails: Right Entrance Stairs-Number of Steps: 3 Home Layout: One level Home Equipment: None      Prior Function Level of Independence: Independent               Hand Dominance   Dominant Hand: Right    Extremity/Trunk Assessment               Lower Extremity Assessment: RLE deficits/detail RLE Deficits / Details: Pt able to move RLE through full ROM and able to take min force. Pt has 5/5 strength on Left side so significant difference between R and L. Pt reported pain in Right posterior thigh with Right side Knee and Hip testing.       Communication   Communication: No difficulties  Cognition Arousal/Alertness: Awake/alert Behavior During Therapy: WFL for tasks assessed/performed Overall Cognitive Status: Within Functional Limits for tasks assessed                      General Comments  Pt testing for vestibular issues inconclusive due to patient having difficulty holding eyes open during testing.  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  Will benefit from Outpt f/u to further assess and treat.      Exercises Other Exercises Other Exercises: Gaze stability exercise to address Right eye having difficulty stabilizing.       Assessment/Plan    PT Assessment Patient needs continued PT services  PT Diagnosis Difficulty walking   PT Problem List Decreased strength;Decreased activity tolerance;Decreased balance;Decreased mobility;Decreased knowledge of use of DME;Decreased safety awareness;Pain  PT Treatment Interventions DME instruction;Gait training;Stair training;Functional mobility training;Therapeutic activities;Therapeutic exercise;Balance training;Neuromuscular re-education;Patient/family education   PT Goals (Current goals can be found in the Care Plan section) Acute Rehab PT Goals Patient Stated Goal: Return to independence PT Goal Formulation: With  patient Time For Goal Achievement: 07/31/14 Potential to Achieve Goals: Good    Frequency Min 2X/week   Barriers to discharge        Co-evaluation               End of Session Equipment Utilized During Treatment: Gait belt Activity Tolerance: Patient limited by pain;Patient tolerated treatment well Patient left: in chair;with call bell/phone within reach Nurse Communication: Mobility status    Functional Assessment Tool Used: clinical judgment Functional Limitation: Mobility: Walking and moving around Mobility: Walking and Moving Around Current Status (Z6109(G8978): At least 1 percent but less than 20 percent impaired, limited or restricted Mobility: Walking and Moving Around Goal Status [phone](G8979): 0 percent impaired, limited or restricted    Time: 1021-1054 PT Time Calculation (min) (ACUTE ONLY): 33 min   Charges:   PT Evaluation $Initial PT Evaluation Tier I: 1 Procedure PT Treatments $Gait Training: 8-22 mins $Therapeutic Exercise: 8-22 mins   PT G Codes:   Functional Assessment Tool Used: clinical judgment Functional Limitation: Mobility: Walking and moving around    EdnaHebert, IrrigonOlivia, SPT 07/17/2014, 11:20 AM   York Spaniellivia Hebert, SPT  Acute Rehabilitation [phone] [facility] [place],PT Acute Rehabilitation [phone] (pager)

## 2014-07-17 NOTE — Progress Notes (Signed)
Subjective: Patient has multiple complaints this morning.  States he is very sleepy ever since he has been given the Depakote.  He also feels he is short of breath.  He also states his right shoulder hurts.  He has no complaints of HA.   Objective: Current vital signs: BP 120/72 mmHg  Pulse 63  Temp(Src) 98 F (36.7 C) (Oral)  Resp 16  Ht 6\' 1"  (1.854 m)  Wt 135.535 kg (298 lb 12.8 oz)  BMI 39.43 kg/m2  SpO2 94% Vital signs in last 24 hours: Temp:  [97.8 F (36.6 C)-98.9 F (37.2 C)] 98 F (36.7 C) (11/18 16100812) Pulse Rate:  [53-75] 63 (11/18 0812) Resp:  [16-21] 16 (11/18 0812) BP: (109-141)/(56-108) 120/72 mmHg (11/18 0812) SpO2:  [93 %-96 %] 94 % (11/18 0812) Weight:  [135.535 kg (298 lb 12.8 oz)] 135.535 kg (298 lb 12.8 oz) (11/18 0400)  Intake/Output from previous day: 11/17 0701 - 11/18 0700 In: 1680 [P.O.:1680] Out: 3825 [Urine:3825] Intake/Output this shift: Total I/O In: -  Out: 400 [Urine:400] Nutritional status: Diet Heart  Neurologic Exam: General: NAD Mental Status: Alert, oriented, thought content appropriate. Speech fluent without evidence of aphasia. Able to follow 3 step commands without difficulty. Cranial Nerves: II: Visual fields grossly normal, pupils equal, round, reactive to light and accommodation III,IV, VI: ptosis not present, extra-ocular motions intact bilaterally V,VII: smile symmetric, facial light touch sensation normal bilaterally VIII: hearing normal bilaterally IX,X: gag reflex present XI: bilateral shoulder shrug XII: midline tongue extension without atrophy or fasciculations  Motor: Right :Upper extremity 5/5Left: Upper extremity 5/5 Lower extremity 5/5Lower extremity 5/5 Tone and bulk:normal tone throughout; no atrophy noted Sensory: Pinprick and light touch intact throughout, bilaterally Deep Tendon Reflexes:   Right: Upper Extremity Left: Upper extremity   biceps (C-5 to C-6) 2/4 biceps (C-5 to C-6) 2/4 tricep (C7) 2/4triceps (C7) 2/4 Brachioradialis (C6) 2/4Brachioradialis (C6) 2/4  Lower Extremity Lower Extremity  quadriceps (L-2 to L-4) 2/4 quadriceps (L-2 to L-4) 2/4 Achilles (S1) 2/4Achilles (S1) 2/4  Plantars: Right: downgoingLeft: downgoing Cerebellar: normal finger-to-nose, normal heel-to-shin test   Lab Results: Basic Metabolic Panel:  Recent Labs Lab 07/14/14 2224 07/14/14 2237 07/15/14 0130 07/16/14 0408  NA 140 142 142 138  K 4.3 3.7 3.8 4.1  CL 101 102 103 100  CO2 27  --  26 25  GLUCOSE 90 90 123* 94  BUN 19 22 18 12   CREATININE 1.44* 1.50* 1.37* 1.34  CALCIUM 9.8  --  9.2 9.1    Liver Function Tests:  Recent Labs Lab 07/14/14 2224 07/15/14 0130  AST 21 17  ALT 18 19  ALKPHOS 98 92  BILITOT 0.2* <0.2*  PROT 7.2 6.2  ALBUMIN 3.8 3.6   No results for input(s): LIPASE, AMYLASE in the last 168 hours. No results for input(s): AMMONIA in the last 168 hours.  CBC:  Recent Labs Lab 07/14/14 2224 07/14/14 2237 07/15/14 0130  WBC 5.4  --  4.3  NEUTROABS 2.1  --  1.7  HGB 15.7 16.7 14.5  HCT 46.3 49.0 43.6  MCV 89.0  --  89.3  PLT 201  --  183    Cardiac Enzymes: No results for input(s): CKTOTAL, CKMB, CKMBINDEX, TROPONINI in the last 168 hours.  Lipid Panel: No results for input(s): CHOL, TRIG, HDL, CHOLHDL, VLDL, LDLCALC in the last 168 hours.  CBG:  Recent Labs Lab 07/16/14 0752 07/16/14 1137 07/16/14 1703 07/16/14 2124 07/17/14 0746  GLUCAP 108* 110* 108* 139* 99  Microbiology: No results found for this or any previous visit.  Coagulation Studies:  Recent Labs  07/14/14 2224  LABPROT 13.6  INR 1.03    Imaging: Ct Head Wo  Contrast  07/16/2014   CLINICAL DATA:  Headache, seizure, right-sided numbness and tingling.  EXAM: CT HEAD WITHOUT CONTRAST  TECHNIQUE: Contiguous axial images were obtained from the base of the skull through the vertex without intravenous contrast.  COMPARISON:  Prior CT from 07/14/2014  FINDINGS: Patchy hypodensity within the periventricular and deep white matter both cerebral hemispheres again noted, most compatible with chronic small vessel ischemic changes. Remote lacunar infarcts involving the right caudate head extending into the right corona radiata again noted, stable. Additional remote lacunar infarct within the posterior limb of the left internal capsule is also unchanged.  No acute large vessel territory infarct. No intracranial hemorrhage. No mass lesion or midline shift. No hydrocephalus. No extra-axial fluid collection.  Scalp soft tissues within normal limits. Calvarium intact. No acute abnormality seen about the orbits.  Paranasal sinuses are clear. Scattered opacity within the left mastoid air cells is stable.  IMPRESSION: 1. No acute intracranial abnormality identified. 2. Remote lacunar infarcts involving the bilateral basal ganglia with chronic small vessel ischemic disease, stable.   Electronically Signed   By: Rise MuBenjamin  McClintock M.D.   On: 07/16/2014 00:53    Medications:  Scheduled: . amLODipine  5 mg Oral Daily  . apixaban  5 mg Oral BID  . atorvastatin  20 mg Oral Daily  . budesonide-formoterol  2 puff Inhalation BID  . diphenhydrAMINE  25 mg Oral Once  . divalproex  500 mg Oral 3 times per day  . hydrochlorothiazide  12.5 mg Oral Daily  . lisinopril  10 mg Oral Daily  . pantoprazole  40 mg Oral Daily    Assessment/Plan:  58 y.o. male presents with complains of uncontrollable twitching/jerkiness/heaviness right arm-face--not present at current time. Continues to not have any jerking of face and arm.  No HA at this time.  No complaining for excessive drowsiness with  the Depakote. EEG normal. Repeat head CT is normal.   Recommend: 1) PT/OT 2) Will change Depakote to Keppra due to excessive drowsiness. Current Cr 1.34.   If cleared by PT/OT no further recommendations.    Felicie MornDavid Bond Grieshop PA-C [facility] [phone]  07/17/2014, 9:37 AM

## 2014-07-17 NOTE — Discharge Summary (Signed)
Physician Discharge Summary  Dennis Brock FAO:[id number] DOB: 04/28/1956 DOA: 07/14/2014  PCP: Dorrene GermanAVBUERE,EDWIN A, MD  Admit date: 07/14/2014 Discharge date: 07/17/2014  Time spent:25 minutes  Recommendations for Outpatient Follow-up:  1. Discharge home with outpatient PCP follow-up It is strongly recommended that patient should not drive any vehicle or operate heavy machinery for at least 6 months or until cleared by PCP or outpatient neurology.  Discharge Diagnoses:  Principal Problem:   Seizure  Active Problems:   History of stroke   Hyperlipidemia   Cardiac pacemaker in situ   Paroxysmal atrial fibrillation   Essential hypertension   Migraine headache   Acute kidney injury   Polysubstance abuse   Discharge Condition: fair  Diet recommendation: cardiac  CODE STATUS: Full code  Filed Weights   07/15/14 0038 07/16/14 0400 07/17/14 0400  Weight: 135.807 kg (299 lb 6.4 oz) 136.986 kg (302 lb) 135.535 kg (298 lb 12.8 oz)    History of present illness:  Reason for to admission H&P for details, but in brief, 58 year old male with recent hospitalization for stroke, with some right-sided weakness and right-sided ptosis,hypertension, hyperlipidemia, sick sinus syndrome status post pacemaker, GERD, COPD,who presented with acute onset abnormal movement of the right face and right extremities lasting for about 20 minutes and spontaneously resolved. This happened again and patient presented to the ED. Head CT on admission was unremarkable. Patient admitted for further workup.patient also complaining of right occipital and posterior neck pain.   Hospital Course:  Seizure-like activity No further symptoms while in the hospital. MRI could not be performed due to pacemaker status. Repeat head CT was done after 24 hours which did not show any acute events. EEG was unremarkable. Given clinical presentation neurology recommend this is likely of seizures and patient was started on  Depakote. However he complained of being very sleepy on Depakote and this was changed to Keppra and patient will be discharged on this. He will follow-up with PCP as outpatient [facility]. Patient has been clearly instructed that he cannot drive any vehicle or operate any heavy machinery at least for 6 months or until he is cleared to be seizure-free by outpatient in neurology on his PCP.  Migraine headache Improved with Compazine and Benadryl. Will discharge him on when necessary Ultracet  paroxysmal A. Fib Stable. On Xarelto  Polysubstance abuse Patient actively using cocaine and cannabis. Urine drug screen positive for cocaine. Counseled extensively.  Hypertension Continue home medication  Acute kidney injury Mild. Resolved.  COPD Continue home inhalers  Since seen by physical therapy and recommended rolling walker with wheels and outpatient PT.  Procedures:  CT head  EEG  Consultations:  neurology  Discharge Exam: Filed Vitals:   07/17/14 0812  BP: 120/72  Pulse: 63  Temp: 98 F (36.7 C)  Resp: 16    General: middle aged obese male in no acute distress HEENT: No pallor, moist oral mucosa Chest clear to auscultation bilaterally, no added sounds CVS: Normal S1 and S2, no murmurs Abdomen: Soft, nontender, nondistended Images: Warm, no edema CNS: Alert and oriented,4+/5 power over right extremities  Discharge Instructions You were cared for by a hospitalist during your hospital stay. If you have any questions about your discharge medications or the care you received while you were in the hospital after you are discharged, you can call the unit and asked to speak with the hospitalist on call if the hospitalist that took care of you is not available. Once you are discharged, your  primary care physician will handle any further medical issues. Please note that NO REFILLS for any discharge medications will be authorized once you are discharged, as it  is imperative that you return to your primary care physician (or establish a relationship with a primary care physician if you do not have one) for your aftercare needs so that they can reassess your need for medications and monitor your lab values.   Current Discharge Medication List    START taking these medications   Details  levETIRAcetam (KEPPRA) 500 MG tablet Take 1 tablet (500 mg total) by mouth 2 (two) times daily. Qty: 60 tablet, Refills: 0    traMADol-acetaminophen (ULTRACET) 37.5-325 MG per tablet Take 1 tablet by mouth every 6 (six) hours as needed (headache). Qty: 30 tablet, Refills: 0      CONTINUE these medications which have NOT CHANGED   Details  albuterol (PROVENTIL HFA;VENTOLIN HFA) 108 (90 BASE) MCG/ACT inhaler Inhale 2 puffs into the lungs 2 (two) times daily as needed for wheezing or shortness of breath.     albuterol (PROVENTIL) (2.5 MG/3ML) 0.083% nebulizer solution Take 2.5 mg by nebulization every 6 (six) hours as needed for wheezing or shortness of breath.     amLODipine (NORVASC) 5 MG tablet Take 1 tablet (5 mg total) by mouth daily. Qty: 30 tablet, Refills: 6    apixaban (ELIQUIS) 5 MG TABS tablet Take 5 mg by mouth 2 (two) times daily.    atorvastatin (LIPITOR) 20 MG tablet Take 20 mg by mouth daily.    budesonide-formoterol (SYMBICORT) 160-4.5 MCG/ACT inhaler Inhale 2 puffs into the lungs daily as needed (shortness of breath).     lisinopril-hydrochlorothiazide (ZESTORETIC) 10-12.5 MG per tablet Take 1 tablet by mouth daily. Qty: 30 tablet, Refills: 0    Menthol-Methyl Salicylate (MUSCLE RUB EX) Apply 1 application topically daily as needed (back pain).    omeprazole (PRILOSEC) 20 MG capsule Take 20 mg by mouth daily.    polyethylene glycol (MIRALAX / GLYCOLAX) packet Take 17 g by mouth daily as needed for mild constipation.       STOP taking these medications     oxyCODONE (OXY IR/ROXICODONE) 5 MG immediate release tablet      doxycycline  (VIBRAMYCIN) 100 MG capsule      predniSONE (DELTASONE) 20 MG tablet        Allergies  Allergen Reactions  . Dilaudid [Hydromorphone] Itching   Follow-up Information    [facility]   Specialty:  Rehabilitation   Why:  Rehab will call with an appointment time. Thanks   Contact information:   [address] [id number] mc [place] [address] [phone]      Follow up with [facility].   Why:  Levan Hurst, 3n1   Contact information:   [address] [place] [address] [phone]       Follow up with [facility]     On 07/22/2014.   Why:   @ 11:30 [facility]    Contact information:   [address] [place] [facility] [address] [phone]       The results of significant diagnostics from this hospitalization (including imaging, microbiology, ancillary and laboratory) are listed below for reference.    Significant Diagnostic Studies: Dg Chest 2 View  06/25/2014   CLINICAL DATA:  Shortness of breath, chest pain.  EXAM: CHEST  2 VIEW  COMPARISON:  06/03/2014  FINDINGS: Prominent cardiomediastinal contours, similar to prior. Left chest wall battery pack with unchanged lead tip positions. Mild interstitial prominence. Mild central vascular congestion. No overt pleural effusion. No pneumothorax. Multilevel degenerative change.  IMPRESSION: Prominent cardiomediastinal contours, similar to prior.  Vascular congestion. Mild interstitial prominence may reflect mild edema.   Electronically Signed   By: Jearld Lesch M.D.   On: 06/25/2014 03:35   Ct Head Wo Contrast  07/16/2014   CLINICAL DATA:  Headache, seizure, right-sided numbness and tingling.  EXAM: CT HEAD WITHOUT CONTRAST  TECHNIQUE: Contiguous axial images were obtained from the base of the skull through the vertex without intravenous contrast.  COMPARISON:   Prior CT from 07/14/2014  FINDINGS: Patchy hypodensity within the periventricular and deep white matter both cerebral hemispheres again noted, most compatible with chronic small vessel ischemic changes. Remote lacunar infarcts involving the right caudate head extending into the right corona radiata again noted, stable. Additional remote lacunar infarct within the posterior limb of the left internal capsule is also unchanged.  No acute large vessel territory infarct. No intracranial hemorrhage. No mass lesion or midline shift. No hydrocephalus. No extra-axial fluid collection.  Scalp soft tissues within normal limits. Calvarium intact. No acute abnormality seen about the orbits.  Paranasal sinuses are clear. Scattered opacity within the left mastoid air cells is stable.  IMPRESSION: 1. No acute intracranial abnormality identified. 2. Remote lacunar infarcts involving the bilateral basal ganglia with chronic small vessel ischemic disease, stable.   Electronically Signed   By: Rise Mu M.D.   On: 07/16/2014 00:53   Ct Head Wo Contrast  07/14/2014   CLINICAL DATA:  Headaches, right-sided facial: And right-sided weakness  EXAM: CT HEAD WITHOUT CONTRAST  TECHNIQUE: Contiguous axial images were obtained from the base of the skull through the vertex without intravenous contrast.  COMPARISON:  06/03/2014  FINDINGS: Bony calvarium is intact. Paranasal sinuses are well aerated. A small amount of fluid is noted within the left mastoid air cells which is stable from the prior exam. Prior lacunar infarct is noted on the right than the recent of basal ganglia and corona radiata which is stable from the prior exam. Prior lacunar infarct in the left thalamus is also noted. No acute hemorrhage, acute infarction or space-occupying mass lesion is noted. Mild chronic white matter ischemic change is noted.  IMPRESSION: Chronic changes without acute abnormality.  These results were called by telephone at the time of  interpretation on 07/14/2014 at 10:51 pm to Dr. Leroy Kennedy, who verbally acknowledged these results.   Electronically Signed   By: Alcide Clever M.D.   On: 07/14/2014 22:52    Microbiology: No results found for this or any previous visit (from the past 240 hour(s)).   Labs: Basic Metabolic Panel:  Recent Labs Lab 07/14/14 2224 07/14/14 2237 07/15/14 0130 07/16/14 0408  NA 140 142 142 138  K 4.3 3.7 3.8 4.1  CL 101 102 103 100  CO2 27  --  26 25  GLUCOSE 90 90 123* 94  BUN 19 22 18 12   CREATININE 1.44* 1.50* 1.37* 1.34  CALCIUM 9.8  --  9.2 9.1   Liver Function Tests:  Recent Labs Lab 07/14/14 2224 07/15/14 0130  AST 21 17  ALT 18 19  ALKPHOS 98 92  BILITOT 0.2* <0.2*  PROT 7.2 6.2  ALBUMIN 3.8 3.6   No results for input(s): LIPASE, AMYLASE in the last 168 hours. No results for input(s): AMMONIA in the last 168 hours. CBC:  Recent Labs Lab 07/14/14 2224 07/14/14 2237 07/15/14 0130  WBC 5.4  --  4.3  NEUTROABS 2.1  --  1.7  HGB 15.7 16.7 14.5  HCT 46.3 49.0 43.6  MCV 89.0  --  89.3  PLT 201  --  183   Cardiac Enzymes: No results for input(s): CKTOTAL, CKMB, CKMBINDEX, TROPONINI in the last 168 hours. BNP: BNP (last 3 results)  Recent Labs  03/08/14 1250 06/03/14 0236 06/25/14 0320  PROBNP 61.1 71.2 158.4*   CBG:  Recent Labs Lab 07/16/14 1703 07/16/14 2124 07/17/14 0746 07/17/14 1144 07/17/14 1621  GLUCAP 108* 139* 99 137* 93       Signed:  Oluwatomisin Deman  [facility] 07/17/2014, 5:01 PM

## 2014-07-17 NOTE — Progress Notes (Signed)
UR completed 

## 2014-07-19 ENCOUNTER — Institutional Professional Consult (permissible substitution): Payer: Medicare Other | Admitting: Pulmonary Disease

## 2014-07-19 ENCOUNTER — Telehealth: Payer: Self-pay

## 2014-07-19 NOTE — Telephone Encounter (Signed)
[facility] Post-discharge Follow-Up Phone Call:  Date of Discharge: 07/17/14 Principal Discharge Diagnosis: Seizure Reason for Chronic Case Management: History of stroke, hyperlipidemia, paroxysmal atrial fibrillation, essential hypertension, acute kidney injury. 3 inpatient admissions and 5 ED visits in the last 6 months Post-discharge Communication: Received return call from patient Call Completed: Yes-spoke with patient Interpreter Needed: No             Language/Dialect:  English   Please check all that apply: X  Patient is knowledgeable of his/her condition(s) and/or treatment. ? Family and/or caregiver is knowledgeable of patient's condition(s) and/or treatment. X  Patient is caring for self at home. ? Patient is receiving home health services. If so, name of agency.    Medication Reconciliation:  X  Medication list reviewed with patient. X  Patient able to obtain needed medications.    Activities of Daily Living:  ? Independent X  Needs assist-Patient using rolling walker for mobility ? Total [facility] (describe)   Community resources in place for patient:  X  None  ? Home Health  ? Assisted Living ? Hospice ? Support Group          Patient Education:  Patient indicates he still has a headache. Reminded patient that Ultracet can be taken every 6 hours as needed for headache.  Patient reminded of [facility] appointment on 07/22/14 at 1130. Patient verbalized understanding. Patient asked if he has a way to get to appointment.  Transportation needed to appointment as patient unable to drive for at least 6 months or until cleared by PCP or outpatient Neurology.  Registrar notified. She will work on arranging transportation and notify patient of arrangements.

## 2014-07-19 NOTE — Telephone Encounter (Signed)
[facility] Post-discharge Follow-Up Phone Call:  Date of Discharge: 07/17/14 Principal Discharge Diagnosis: Seizure Reason for Chronic Case Management:  History of stroke, hyperlipidemia, paroxysmal atrial fibrillation, essential hypertension, acute kidney injury.  3 inpatient admissions and 5 ED visits in the last 6 months. Post-discharge Communication: Call placed to patient's listed home number (number disconnected) and both listed emergency contacts-voicemails left for both contacts requesting patient return call to [facility].

## 2014-07-22 ENCOUNTER — Ambulatory Visit: Payer: Medicare Other | Attending: Internal Medicine | Admitting: Internal Medicine

## 2014-07-22 ENCOUNTER — Ambulatory Visit: Payer: Medicare Other | Admitting: *Deleted

## 2014-07-22 VITALS — BP 134/92 | HR 64 | Temp 98.6°F | Resp 18 | Ht 72.0 in | Wt 300.0 lb

## 2014-07-22 DIAGNOSIS — J020 Streptococcal pharyngitis: Secondary | ICD-10-CM

## 2014-07-22 DIAGNOSIS — I639 Cerebral infarction, unspecified: Secondary | ICD-10-CM

## 2014-07-22 DIAGNOSIS — I10 Essential (primary) hypertension: Secondary | ICD-10-CM

## 2014-07-22 LAB — POCT RAPID STREP A (OFFICE): RAPID STREP A SCREEN: NEGATIVE

## 2014-07-22 MED ORDER — LEVETIRACETAM 500 MG PO TABS: 500.0000 mg | ORAL_TABLET | Freq: Two times a day (BID) | ORAL | Status: DC

## 2014-07-22 MED ORDER — TRAMADOL-ACETAMINOPHEN 37.5-325 MG PO TABS: 1.0000 | ORAL_TABLET | Freq: Four times a day (QID) | ORAL | Status: DC | PRN

## 2014-07-22 MED ORDER — AMOXICILLIN-POT CLAVULANATE 875-125 MG PO TABS: 1.0000 | ORAL_TABLET | Freq: Two times a day (BID) | ORAL | Status: AC

## 2014-07-22 MED ORDER — TRAMADOL HCL 50 MG PO TABS: 50.0000 mg | ORAL_TABLET | Freq: Three times a day (TID) | ORAL | Status: AC | PRN

## 2014-07-22 MED ORDER — POLYETHYLENE GLYCOL 3350 17 G PO PACK: 17.0000 g | PACK | Freq: Every day | ORAL | Status: DC | PRN

## 2014-07-22 MED ORDER — LISINOPRIL-HYDROCHLOROTHIAZIDE 10-12.5 MG PO TABS: 1.0000 | ORAL_TABLET | Freq: Every day | ORAL | Status: DC

## 2014-07-22 MED ORDER — ATORVASTATIN CALCIUM 20 MG PO TABS: 20.0000 mg | ORAL_TABLET | Freq: Every day | ORAL | Status: AC

## 2014-07-22 MED ORDER — ATORVASTATIN CALCIUM 20 MG PO TABS: 20.0000 mg | ORAL_TABLET | Freq: Every day | ORAL | Status: DC

## 2014-07-22 MED ORDER — OMEPRAZOLE 20 MG PO CPDR: 20.0000 mg | DELAYED_RELEASE_CAPSULE | Freq: Every day | ORAL | Status: AC

## 2014-07-22 MED ORDER — APIXABAN 5 MG PO TABS: 5.0000 mg | ORAL_TABLET | Freq: Two times a day (BID) | ORAL | Status: AC

## 2014-07-22 MED ORDER — ALBUTEROL SULFATE (2.5 MG/3ML) 0.083% IN NEBU: 2.5000 mg | INHALATION_SOLUTION | Freq: Four times a day (QID) | RESPIRATORY_TRACT | Status: AC | PRN

## 2014-07-22 MED ORDER — BUDESONIDE-FORMOTEROL FUMARATE 160-4.5 MCG/ACT IN AERO: 2.0000 | INHALATION_SPRAY | Freq: Every day | RESPIRATORY_TRACT | Status: DC | PRN

## 2014-07-22 MED ORDER — APIXABAN 5 MG PO TABS: 5.0000 mg | ORAL_TABLET | Freq: Two times a day (BID) | ORAL | Status: DC

## 2014-07-22 MED ORDER — FLUTICASONE PROPIONATE 50 MCG/ACT NA SUSP: 2.0000 | Freq: Every day | NASAL | Status: AC

## 2014-07-22 MED ORDER — ALBUTEROL SULFATE HFA 108 (90 BASE) MCG/ACT IN AERS: 2.0000 | INHALATION_SPRAY | Freq: Two times a day (BID) | RESPIRATORY_TRACT | Status: DC | PRN

## 2014-07-22 MED ORDER — AMLODIPINE BESYLATE 5 MG PO TABS: 5.0000 mg | ORAL_TABLET | Freq: Every day | ORAL | Status: AC

## 2014-07-22 MED ORDER — ALBUTEROL SULFATE HFA 108 (90 BASE) MCG/ACT IN AERS: 2.0000 | INHALATION_SPRAY | Freq: Two times a day (BID) | RESPIRATORY_TRACT | Status: AC | PRN

## 2014-07-22 MED ORDER — AMLODIPINE BESYLATE 5 MG PO TABS
5.0000 mg | ORAL_TABLET | Freq: Every day | ORAL | Status: DC
Start: 2014-07-22 — End: 2014-07-22

## 2014-07-22 MED ORDER — ALBUTEROL SULFATE (2.5 MG/3ML) 0.083% IN NEBU: 2.5000 mg | INHALATION_SOLUTION | Freq: Four times a day (QID) | RESPIRATORY_TRACT | Status: DC | PRN

## 2014-07-22 MED ORDER — OMEPRAZOLE 20 MG PO CPDR: 20.0000 mg | DELAYED_RELEASE_CAPSULE | Freq: Every day | ORAL | Status: DC

## 2014-07-22 MED ORDER — BUDESONIDE-FORMOTEROL FUMARATE 160-4.5 MCG/ACT IN AERO: 2.0000 | INHALATION_SPRAY | Freq: Every day | RESPIRATORY_TRACT | Status: AC | PRN

## 2014-07-22 MED ORDER — POLYETHYLENE GLYCOL 3350 17 G PO PACK: 17.0000 g | PACK | Freq: Every day | ORAL | Status: AC | PRN

## 2014-07-22 MED ORDER — AMOXICILLIN-POT CLAVULANATE 875-125 MG PO TABS: 1.0000 | ORAL_TABLET | Freq: Two times a day (BID) | ORAL | Status: DC

## 2014-07-22 NOTE — Progress Notes (Signed)
Pt presents TCC for follow up, c/o dizziness, dry cough and itching ears.

## 2014-07-22 NOTE — Progress Notes (Signed)
Patient ID: Dennis HeldJerry Lee Brock, male   DOB: [birth year], 58 y.o.   MRN: [id number]   CC: Headache and sore throat  HPI: 58 year old male with recent hospitalization for stroke, with some right-sided weakness and right-sided ptosis,hypertension, hyperlipidemia, sick sinus syndrome status post pacemaker, GERD, COPD,who presented with acute onset abnormal movement of the right face and right extremities lasting for about 20 minutes and spontaneously resolved. This happened again and patient presented to the ED. Head CT on admission was unremarkable. Patient admitted for further workup.patient also complaining of right occipital and posterior neck pain. Patient was diagnosed with seizure-like activity and started on Keppra. The patient was found to have a positive urine drug screen as well. Patient states that he did not feel that he had a seizure. Today he complains of a sore throat for the last 2 days, he has a nonproductive cough. He primarily complains of a headache which has not gone away since the beginning of October. He denies any residual weakness in his arm or in his leg. No slurring of speech. He has not taken his Keppra since discharged and just picked up his prescription today.  Allergies  Allergen Reactions  . Dilaudid [Hydromorphone] Itching   Past Medical History  Diagnosis Date  . Hypertension     amlodipine, lisinopril  . Stroke     was on aspirin before stroke. [place]  sent home aspirin.   Marland Kitchen. Hyperlipidemia     zocor 40mg   . Sick sinus syndrome     s/p pacemaker, no defibrillator  . GERD (gastroesophageal reflux disease)   . Chronic pain syndrome   . Seasonal allergies   . Asthma   . Chronic kidney disease   . GERD (gastroesophageal reflux disease)   . Arthritis   . COPD (chronic obstructive pulmonary disease)   . Lumbar disc disease   . Pacemaker    Current Outpatient Prescriptions on File Prior to Visit  Medication Sig Dispense Refill  . Menthol-Methyl  Salicylate (MUSCLE RUB EX) Apply 1 application topically daily as needed (back pain).     No current facility-administered medications on file prior to visit.   Family History  Problem Relation Age of Onset  . Stroke      grandfather and uncle died of stroke  . Heart attack      uncle  . Cancer Father     throat  . Cancer - Other Father     throat  . Transient ischemic attack Mother     [id number] and doing well  . Heart disease Mother   . COPD Mother   . Hypertension Sister   . COPD Sister   . Hypertension Sister   . COPD Sister   . Hypertension Sister   . COPD Sister   . Hypertension Sister   . COPD Sister   . Hypertension Sister   . COPD Sister    History   Social History  . Marital Status: Single    Spouse Name: N/A    Number of Children: N/A  . Years of Education: N/A   Occupational History  . unemployed    Social History Main Topics  . Smoking status: Current Every Day Smoker    Types: Cigarettes  . Smokeless tobacco: Never Used  . Alcohol Use: No     Comment: seldom  . Drug Use: Yes    Special: Cocaine  . Sexual Activity: Not on file   Other Topics Concern  . Not on file  Social History Narrative   Recently moved from [place] 2 months ago. Has not established with primary care doctor.    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    Walk every other day 1 mile.       Currently living with sister but looking for another residence.    On disability. Medicare.     Review of Systems  Constitutional: Negative for fever, chills, diaphoresis, activity change, appetite change and fatigue.  HENT: Negative for ear pain, nosebleeds, congestion, facial swelling, rhinorrhea, neck pain, neck stiffness and ear discharge.   Eyes: Negative for pain, discharge, redness, itching and visual disturbance.  Respiratory: Negative for cough, choking, chest tightness, shortness of breath, wheezing and stridor.   Cardiovascular: Negative for chest pain,  palpitations and leg swelling.  Gastrointestinal: Negative for abdominal distention.  Genitourinary: Negative for dysuria, urgency, frequency, hematuria, flank pain, decreased urine volume, difficulty urinating and dyspareunia.  Musculoskeletal: Negative for back pain, joint swelling, arthralgias and gait problem.  Neurological: Negative for dizziness, tremors, seizures, syncope, facial asymmetry, speech difficulty, weakness, light-headedness, numbness , positive for headaches.  Hematological: Negative for adenopathy. Does not bruise/bleed easily.  Psychiatric/Behavioral: Negative for hallucinations, behavioral problems, confusion, dysphoric mood, decreased concentration and agitation.    Objective:   Filed Vitals:   07/22/14 1050  BP: 134/92  Pulse: 64  Temp: 98.6 F (37 C)  Resp: 18    Physical Exam  Constitutional: Appears well-developed and well-nourished. No distress.  HENT: Normocephalic. External right and left ear normal. Oropharynx is clear and moist.  Eyes: Conjunctivae and EOM are normal. PERRLA, no scleral icterus.  Neck: Normal ROM. Neck supple. No JVD. No tracheal deviation. No thyromegaly.  CVS: RRR, S1/S2 +, no murmurs, no gallops, no carotid bruit.  Pulmonary: Effort and breath sounds normal, no stridor, rhonchi, wheezes, rales.  Abdominal: Soft. BS +,  no distension, tenderness, rebound or guarding.  Musculoskeletal: Normal range of motion. No edema and no tenderness.  Lymphadenopathy: No lymphadenopathy noted, cervical, inguinal. Neuro: Alert. Normal reflexes, muscle tone coordination. No cranial nerve deficit. Skin: Skin is warm and dry. No rash noted. Not diaphoretic. No erythema. No pallor.  Psychiatric: Normal mood and affect. Behavior, judgment, thought content normal.   Lab Results  Component Value Date   WBC 4.3 07/15/2014   HGB 14.5 07/15/2014   HCT 43.6 07/15/2014   MCV 89.3 07/15/2014   PLT 183 07/15/2014   Lab Results  Component Value Date    CREATININE 1.34 07/16/2014   BUN 12 07/16/2014   NA 138 07/16/2014   K 4.1 07/16/2014   CL 100 07/16/2014   CO2 25 07/16/2014    Lab Results  Component Value Date   HGBA1C 6.0* 06/04/2014   Lipid Panel     Component Value Date/Time   CHOL 140 06/04/2014 0510   TRIG 129 06/04/2014 0510   HDL 68 06/04/2014 0510   CHOLHDL 2.1 06/04/2014 0510   VLDL 26 06/04/2014 0510   LDLCALC 46 06/04/2014 0510      Assessment and plan:   Patient Active Problem List   Diagnosis Date Noted  . Migraine headache 07/17/2014  . Acute kidney injury 07/17/2014  . HA (headache)   . Seizure   . Essential hypertension 07/15/2014  . Cerebral thrombosis with cerebral infarction 07/15/2014  . OSA (obstructive sleep apnea) 06/04/2014  . Acute CVA (cerebrovascular accident) 06/03/2014  . Right sided weakness 06/03/2014  . Chronic diastolic heart failure 06/03/2014  .  Cocaine abuse 06/03/2014  . Tobacco abuse 06/03/2014  . Chest pain 02/21/2014  . Cardiac pacemaker in situ 02/21/2014  . Morbid obesity   . Lumbar disc disease   . COPD (chronic obstructive pulmonary disease)   . Long-term (current) use of anticoagulants   . Paroxysmal atrial fibrillation   . Hypertensive heart disease   . History of stroke   . Hyperlipidemia   . Sick sinus syndrome   . GERD (gastroesophageal reflux disease)   . Chronic pain syndrome        Seizure-like activity Patient denies any history of seizures. He does not feel that he had seizures during his last admission. He said that he felt fine and drove himself to the hospital. MRI could not be performed due to pacemaker status. Repeat head CT was done after 24 hours which did not show any acute events. EEG was unremarkable. Given clinical presentation neurology recommend this is likely of seizures and patient was started on Depakote. However he complained of being very sleepy on Depakote and this was changed to Keppra and patient will be discharged on this. Patient  has been clearly instructed that he cannot drive any vehicle or operate any heavy machinery at least for 6 months or until he is cleared to be seizure-free by outpatient in neurology on his PCP.  Laryngitis We'll check a rapid strep a Started the patient on Augmentin for 10 days Flonase for nasal congestion  Migraine headache Improved with Compazine and Benadryl. Will discharge him on when necessary Ultracet, change to tramadol  paroxysmal A. Fib Stable. On eliquis  Polysubstance abuse Patient actively using cocaine and cannabis. Urine drug screen positive for cocaine. Counseled extensively., Told the patient that this could be contribution to his headaches  Hypertension Continue home medication  Acute kidney injury Mild. Resolved.  COPD Continue home inhalers  Patient to follow-up in 4 weeks

## 2014-07-23 ENCOUNTER — Ambulatory Visit (INDEPENDENT_AMBULATORY_CARE_PROVIDER_SITE_OTHER): Payer: Medicare Other | Admitting: Nurse Practitioner

## 2014-07-23 ENCOUNTER — Encounter: Payer: Self-pay | Admitting: Nurse Practitioner

## 2014-07-23 VITALS — BP 121/90 | HR 62 | Ht 73.0 in | Wt 300.0 lb

## 2014-07-23 DIAGNOSIS — M6289 Other specified disorders of muscle: Secondary | ICD-10-CM

## 2014-07-23 DIAGNOSIS — I6330 Cerebral infarction due to thrombosis of unspecified cerebral artery: Secondary | ICD-10-CM

## 2014-07-23 DIAGNOSIS — R531 Weakness: Secondary | ICD-10-CM

## 2014-07-23 DIAGNOSIS — Z7901 Long term (current) use of anticoagulants: Secondary | ICD-10-CM

## 2014-07-23 DIAGNOSIS — G459 Transient cerebral ischemic attack, unspecified: Secondary | ICD-10-CM

## 2014-07-23 NOTE — Progress Notes (Signed)
STROKE CLINIC   PATIENT: Dennis HeldJerry Lee Brock DOB: 11-01-1955  REASON FOR VISIT: hospital follow up for stroke HISTORY FROM: patient  HISTORY OF PRESENT ILLNESS: Dennis HeldJerry Lee Brock is an 58 y.o. male who comes to the office for first hospital follow up post hospital discharge for stroke.  He has a history of paroxysmal atrial fibrillation and sick sinus syndrome on Eliquis, hypertension and hyperlipidemia, as well as previous cerebral infarction, presenting with new onset drooping right eyelid as well as weakness and numbness of right arm and leg. Patient has a cardiac pacemaker. He admits to recent cocaine use. CT scan of his head showed old right corona radiata/basal ganglia infarction as well as old left thalamic infarction, but no acute abnormality. Patient had a number of other complaints including headache and chest pain. NIH stroke score was 2. He was last known well 12:30 AM on 06/03/2014. Patient was not administered TPA secondary to being on Eliquis and minimal deficits. He was admitted for further evaluation and treatment. LDL 46, HGBA1c was 6.0. CT angio of head an neckshowed no large vessel occlusion. He could not undergo MRI due to pacemaker. He refuses CPAP for his OSA.  He was discharged home with no therapy needs.  Went back to the hospital 07/14/14 with right facial twitching and right sided-weakness that was transient. CT brain without acute abnormality. There was concern for a possible right focal seizure without impairment of consciousness rather than a new stroke. EEG recording was normal with no evidence of seizure disorder demonstrated, yet patient was was discharged on Keppra anyway. He has not taken any of the medication yet and has had no more similar symptoms. Urine drug screen has been consistently positive for cocaine, last 07/14/14. He complaints of constant dull headache for the past 3 months, and right-sided pain and weakness.   REVIEW OF SYSTEMS: Full 14 system review  of systems performed and notable only for: fatigue, chest pain, swelling in legs, blurred vision. Short of breath, cough, increased thirst, joint pain, aching muscles, memory loss, confusion, headache, nubness, dizziness, restless legs, anxiety, decreased energy.  ALLERGIES: Allergies  Allergen Reactions  . Dilaudid [Hydromorphone] Itching    HOME MEDICATIONS: Outpatient Prescriptions Prior to Visit  Medication Sig Dispense Refill  . albuterol (PROVENTIL HFA;VENTOLIN HFA) 108 (90 BASE) MCG/ACT inhaler Inhale 2 puffs into the lungs 2 (two) times daily as needed for wheezing or shortness of breath. 1 Inhaler 3  . albuterol (PROVENTIL) (2.5 MG/3ML) 0.083% nebulizer solution Take 3 mLs (2.5 mg total) by nebulization every 6 (six) hours as needed for wheezing or shortness of breath. 75 mL 3  . amLODipine (NORVASC) 5 MG tablet Take 1 tablet (5 mg total) by mouth daily. 30 tablet 6  . amoxicillin-clavulanate (AUGMENTIN) 875-125 MG per tablet Take 1 tablet by mouth 2 (two) times daily. 20 tablet 0  . apixaban (ELIQUIS) 5 MG TABS tablet Take 1 tablet (5 mg total) by mouth 2 (two) times daily. 60 tablet 3  . atorvastatin (LIPITOR) 20 MG tablet Take 1 tablet (20 mg total) by mouth daily. 60 tablet 3  . budesonide-formoterol (SYMBICORT) 160-4.5 MCG/ACT inhaler Inhale 2 puffs into the lungs daily as needed (shortness of breath). 1 Inhaler 3  . lisinopril-hydrochlorothiazide (ZESTORETIC) 10-12.5 MG per tablet Take 1 tablet by mouth daily. 30 tablet 0  . omeprazole (PRILOSEC) 20 MG capsule Take 1 capsule (20 mg total) by mouth daily. 60 capsule 3  . polyethylene glycol (MIRALAX / GLYCOLAX) packet Take 17 g  by mouth daily as needed for mild constipation. 14 each 3  . traMADol (ULTRAM) 50 MG tablet Take 1 tablet (50 mg total) by mouth every 8 (eight) hours as needed. 45 tablet 0  . levETIRAcetam (KEPPRA) 500 MG tablet Take 1 tablet (500 mg total) by mouth 2 (two) times daily. 60 tablet 0  . fluticasone  (FLONASE) 50 MCG/ACT nasal spray Place 2 sprays into both nostrils daily. (Patient not taking: Reported on 07/23/2014) 16 g 6  . Menthol-Methyl Salicylate (MUSCLE RUB EX) Apply 1 application topically daily as needed (back pain).     No facility-administered medications prior to visit.    PHYSICAL EXAM Filed Vitals:   07/23/14 0942  BP: 121/90  Pulse: 62  Height: 6\' 1"  (1.854 m)  Weight: 300 lb (136.079 kg)   Body mass index is 39.59 kg/(m^2).  Generalized: Well developed, in no acute distress  Head: normocephalic and atraumatic. Oropharynx benign  Neck: Supple, no carotid bruits  Cardiac: Regular rate rhythm, no murmur  Musculoskeletal: No deformity   Neurological examination  Mentation: Alert oriented to time, place, history taking. Follows all commands speech and language fluent Cranial nerve II-XII:  Right eye his ptosis but equal pupillary size and reaction to light. No exopthalmos, subjective diminished sensation right face. Extraocular moments are full range without any disconjugate gaze or diplopia. Fundi were not visualized. Left lower facial weakness.  Motor: The motor testing reveals 4 over 5 strength on right extremities. 5/5 strength on the left extremities. Mildly weaker right hand grip. Mild weakness of right dorsiflexor. Good symmetric motor tone is noted throughout.  Sensory: Sensory testing is intact to soft touch on all 4 extremities. No evidence of extinction is noted.  Coordination: Cerebellar testing reveals mild right finger-to-nose dysmetria.  Symmetric heel-to-shin bilaterally.  Gait and station: Gait is normal. Unable to toe and heel walk. Tandem gait is unsteady. Romberg is negative. Reflexes: Deep tendon reflexes are symmetric and normal bilaterally.   NIHSS: 1 MRs: 1   ASSESSMENT: Dennis Brock is a 58 y.o. AA male with history of paroxysmal atrial fibrillation and sick sinus syndrome on Eliquis, hypertension, and hyperlipidemia, presented with  new onset drooping right eyelid, weakness and numbness of right arm and leg.  Right brainstem/pontine infarct, likely due to small vessel disease given location with resultant R Horner's syndrome (R ptosis, R dysmetria).He had recrudescence of old stroke symptoms (L hemiparesis). Unable to obtain MRI due to pacemaker.   PLAN: I had a long discussion with the patient regarding his recent stroke, discussed results of evaluation in the hospital and answered questions. Continue eliquis (apixaban) for secondary stroke prevention due to atrial fibrillation and maintain strict control of hypertension with blood pressure goal below 130/90, and lipids with LDL cholesterol goal below 100 mg/dL.  He was counseled to not smoke or use cocaine. Patient counseled to be compliant with his medications. I commended him for stopping smoking and encouraged him to loose weight. Discontinue Keppra, I do not feel strongly that his episode was focal seizure - more likely TIA. [facility]er to [facility] for [facility] and OT.  Followup in the future with Dr. Pearlean BrownieSethi in 3 months, sooner as needed.  Orders Placed This Encounter  Procedures  . Ambulatory referral to Physical Therapy  . Ambulatory referral to Occupational Therapy   Return in about 3 months (around 10/23/2014) for stroke.  Tawny AsalLYNN E. Teja Judice, MSN, FNP-BC, A/GNP-C 07/23/2014, 10:33 AM [facility] [address], [address] [place], [place]  [address] [phone]  Note: This document was prepared with digital dictation and possible smart phrase technology. Any transcriptional errors that result from this process are unintentional.

## 2014-07-23 NOTE — Patient Instructions (Addendum)
Continue eliquis (apixaban) for secondary stroke prevention due to atrial fibrillation and maintain strict control of hypertension with blood pressure goal below 130/90, and lipids with LDL cholesterol goal below 100 mg/dL.  You may take Tylenol for headache pain, avoid NSAID medications such as Advil, Ibuprofen, Aleve because you are on a blood thinner.  I have ordered Physical Therapy and Occupational Therapy, at [facility], someone will call you to schedule.  Followup in the future with Dr. Pearlean BrownieSethi in 3 months, sooner as needed.    Stroke Prevention Some medical conditions and behaviors are associated with an increased chance of having a stroke. You may prevent a stroke by making healthy choices and managing medical conditions. HOW CAN I REDUCE MY RISK OF HAVING A STROKE?   Stay physically active. Get at least 30 minutes of activity on most or all days.  Do not smoke. It may also be helpful to avoid exposure to secondhand smoke.  Limit alcohol use. Moderate alcohol use is considered to be:  No more than 2 drinks per day for men.  No more than 1 drink per day for nonpregnant women.  Eat healthy foods. This involves:  Eating 5 or more servings of fruits and vegetables a day.  Making dietary changes that address high blood pressure (hypertension), high cholesterol, diabetes, or obesity.  Manage your cholesterol levels.  Making food choices that are high in fiber and low in saturated fat, trans fat, and cholesterol may control cholesterol levels.  Take any prescribed medicines to control cholesterol as directed by your health care provider.  Manage your diabetes.  Controlling your carbohydrate and sugar intake is recommended to manage diabetes.  Take any prescribed medicines to control diabetes as directed by your health care provider.  Control your hypertension.  Making food choices that are low in salt (sodium), saturated fat, trans fat, and cholesterol is recommended to  manage hypertension.  Take any prescribed medicines to control hypertension as directed by your health care provider.  Maintain a healthy weight.  Reducing calorie intake and making food choices that are low in sodium, saturated fat, trans fat, and cholesterol are recommended to manage weight.  Stop drug abuse.  Avoid taking birth control pills.  Talk to your health care provider about the risks of taking birth control pills if you are over 11042 years old, smoke, get migraines, or have ever had a blood clot.  Get evaluated for sleep disorders (sleep apnea).  Talk to your health care provider about getting a sleep evaluation if you snore a lot or have excessive sleepiness.  Take medicines only as directed by your health care provider.  For some people, aspirin or blood thinners (anticoagulants) are helpful in reducing the risk of forming abnormal blood clots that can lead to stroke. If you have the irregular heart rhythm of atrial fibrillation, you should be on a blood thinner unless there is a good reason you cannot take them.  Understand all your medicine instructions.  Make sure that other conditions (such as anemia or atherosclerosis) are addressed. SEEK IMMEDIATE MEDICAL CARE IF:   You have sudden weakness or numbness of the face, arm, or leg, especially on one side of the body.  Your face or eyelid droops to one side.  You have sudden confusion.  You have trouble speaking (aphasia) or understanding.  You have sudden trouble seeing in one or both eyes.  You have sudden trouble walking.  You have dizziness.  You have a loss of balance or  coordination.  You have a sudden, severe headache with no known cause.  You have new chest pain or an irregular heartbeat. Any of these symptoms may represent a serious problem that is an emergency. Do not wait to see if the symptoms will go away. Get medical help at once. Call your local emergency services (911 in [place] Do not drive  yourself to the hospital. Document Released: 09/23/2004 Document Revised: 12/31/2013 Document Reviewed: 02/16/2013 [facility] Patient Information 2015 [employer] [place]. This information is not intended to replace advice given to you by your health care provider. Make sure you discuss any questions you have with your health care provider.

## 2014-07-23 NOTE — Progress Notes (Signed)
LCSW met with patient in order to support patient with resources in the community. LCSW provided patient with application for [facility]. LCSW sent this out. LCSW also helped patient coordinate his follow up nuerology appointment. Patient will follow up as needed.  Christene Lye MSW, LCSW

## 2014-07-24 ENCOUNTER — Encounter: Payer: Self-pay | Admitting: *Deleted

## 2014-07-24 NOTE — Progress Notes (Signed)
I agree with the above plan 

## 2014-08-21 ENCOUNTER — Other Ambulatory Visit: Payer: Self-pay | Admitting: Emergency Medicine

## 2014-08-21 MED ORDER — LISINOPRIL-HYDROCHLOROTHIAZIDE 10-12.5 MG PO TABS: 1.0000 | ORAL_TABLET | Freq: Every day | ORAL | Status: AC

## 2014-08-27 ENCOUNTER — Encounter: Payer: Self-pay | Admitting: *Deleted

## 2014-09-25 ENCOUNTER — Telehealth: Payer: Self-pay | Admitting: *Deleted

## 2014-09-25 NOTE — Telephone Encounter (Signed)
Spoke with patient's sister Eber JonesCarolyn and informed her that appointment needed to be r/s, Eber JonesCarolyn states that patient is in the hospital and will be going to rehab afterwards due to having 2 stroke, informed Eber JonesCarolyn that patient's appointment for 10/04/14 has been cancelled. Eber JonesCarolyn agreed with this decision.

## 2014-09-27 ENCOUNTER — Encounter: Payer: Self-pay | Admitting: *Deleted

## 2014-10-04 ENCOUNTER — Ambulatory Visit: Payer: Medicare Other | Admitting: Neurology

## 2014-10-24 ENCOUNTER — Encounter: Payer: Self-pay | Admitting: *Deleted

## 2014-11-28 ENCOUNTER — Encounter: Payer: Self-pay | Admitting: *Deleted

## 2014-12-25 ENCOUNTER — Encounter: Payer: Self-pay | Admitting: *Deleted

## 2016-06-24 IMAGING — CR DG CHEST 2V
2 series · 2 of 2 positions shown · non-contrast
Comparison: 06/03/2014

CLINICAL DATA: Shortness of breath, chest pain.

EXAM:
CHEST  2 VIEW

[w chest pa]
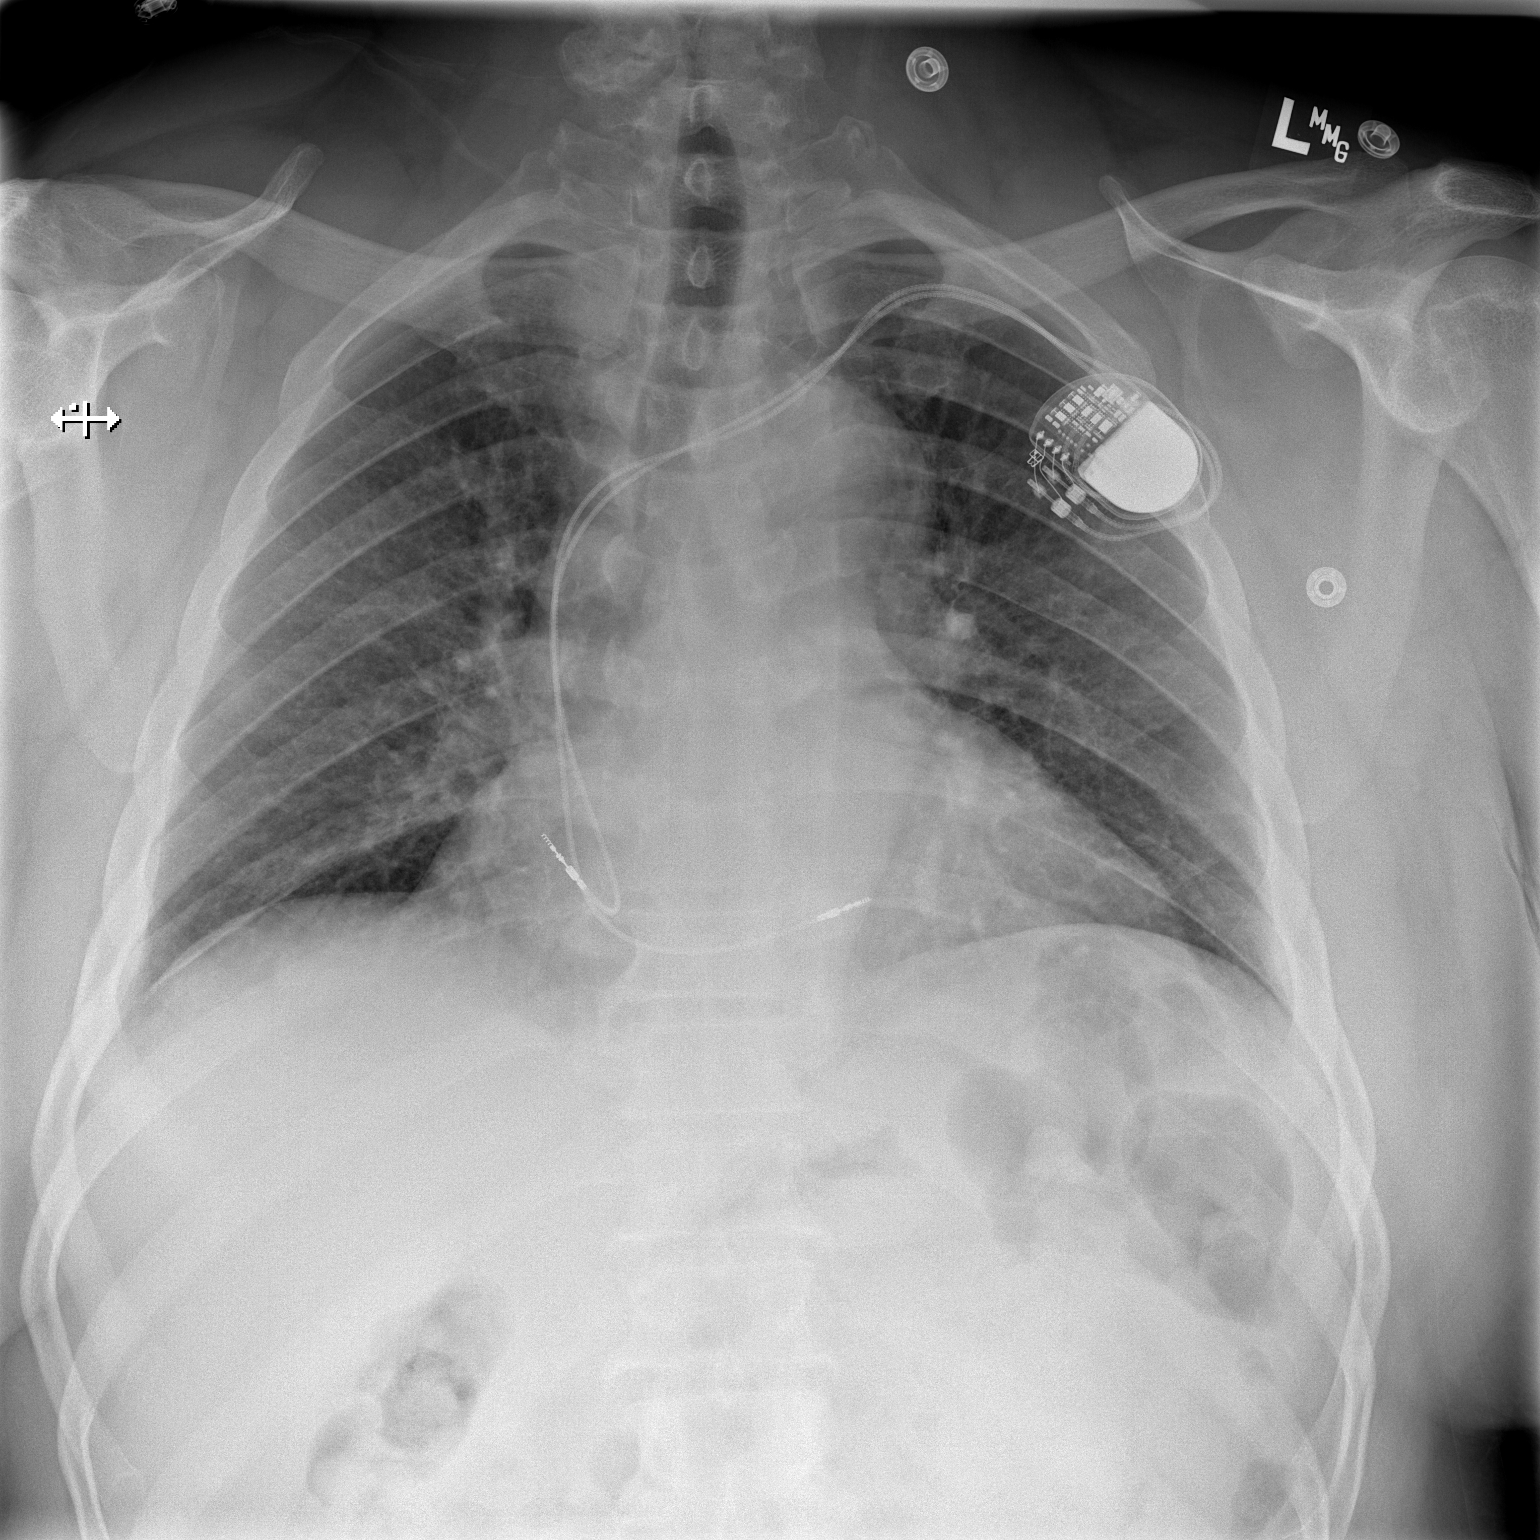

[w chest lat]
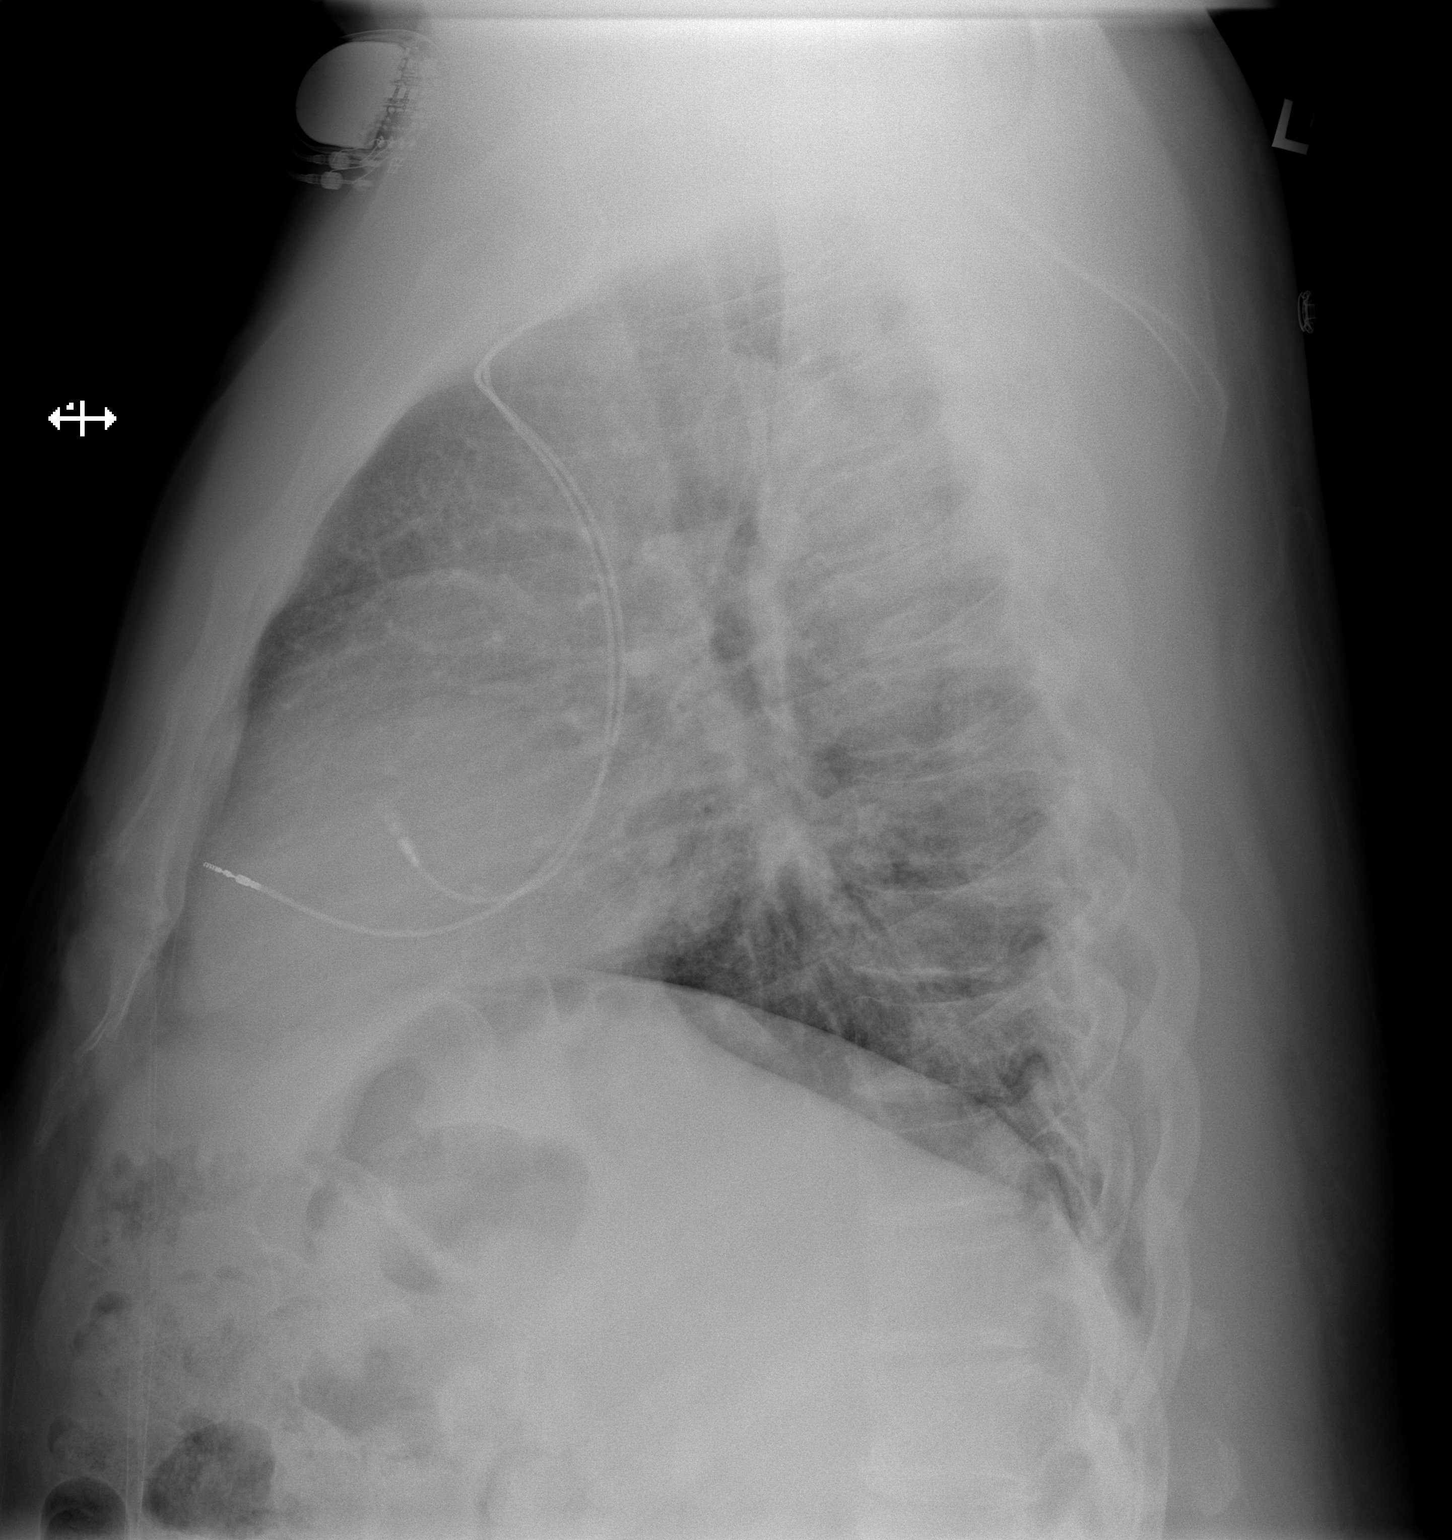

[2 of 2 positions shown; findings below may reference images not displayed]

FINDINGS: Prominent cardiomediastinal contours, similar to prior. Left chest
wall battery pack with unchanged lead tip positions. Mild
interstitial prominence. Mild central vascular congestion. No overt
pleural effusion. No pneumothorax. Multilevel degenerative change.
IMPRESSION: Prominent cardiomediastinal contours, similar to prior.

Vascular congestion. Mild interstitial prominence may reflect mild
edema.

## 2016-07-13 IMAGING — CT CT HEAD W/O CM
1 series · 16 of 30 positions shown, 20 images · non-contrast
Comparison: 06/03/2014

CLINICAL DATA: Headaches, right-sided facial: And right-sided
weakness

EXAM:
CT HEAD WITHOUT CONTRAST
TECHNIQUE: Contiguous axial images were obtained from the base of the skull
through the vertex without intravenous contrast.

[Series 2: head 5.0 h30s · axial · 0.47mm/px · z∈[-124,+26]mm · 16 of 34 slices shown, 20 images]
[im 2/34  brain]
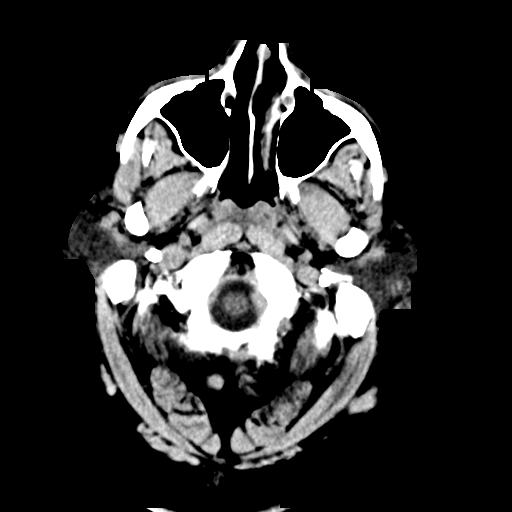
[im 2/34  bone]
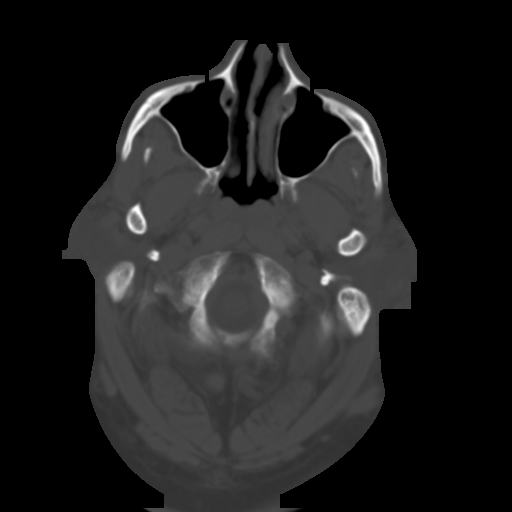
[im 4/34  brain]
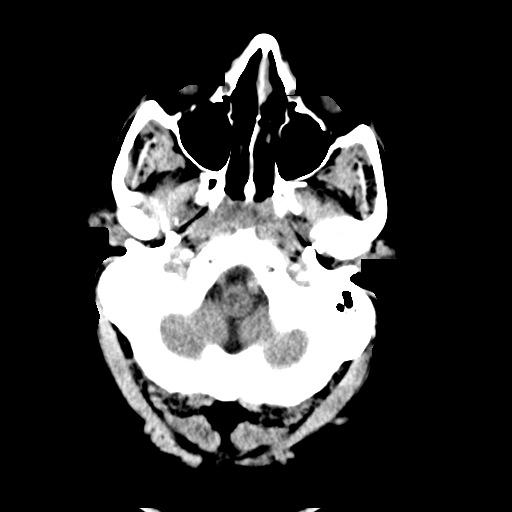
[im 6/34  brain]
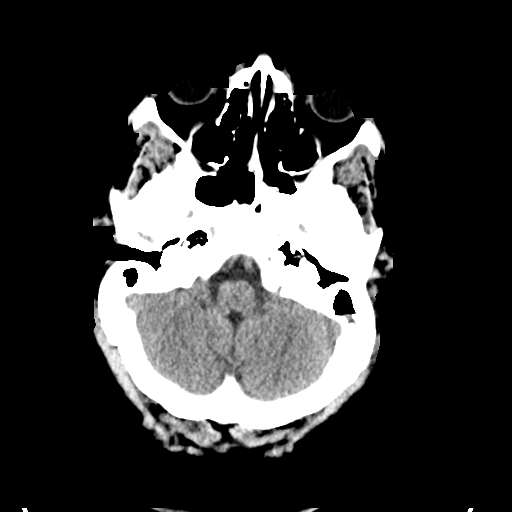
[im 8/34  brain]
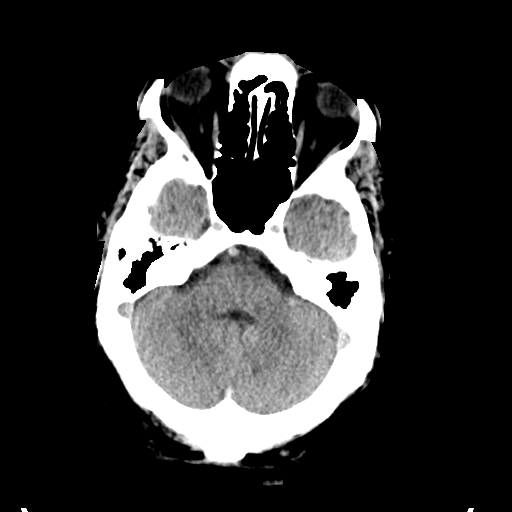
[im 10/34  brain]
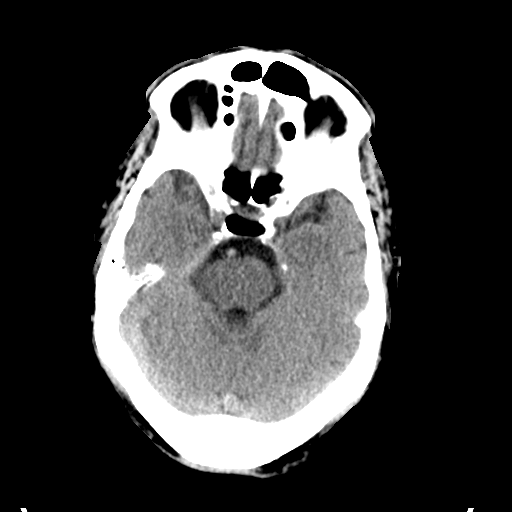
[im 10/34  bone]
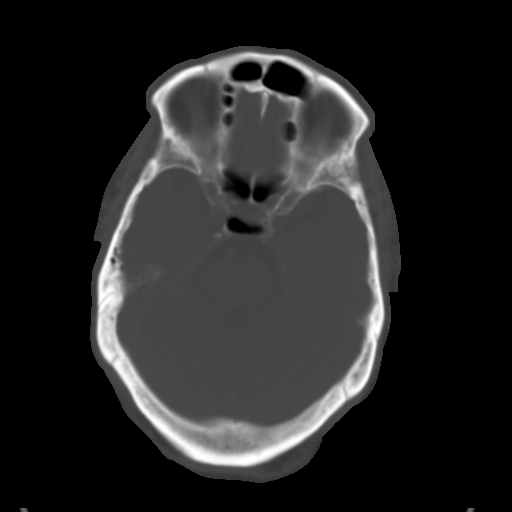
[im 12/34  brain]
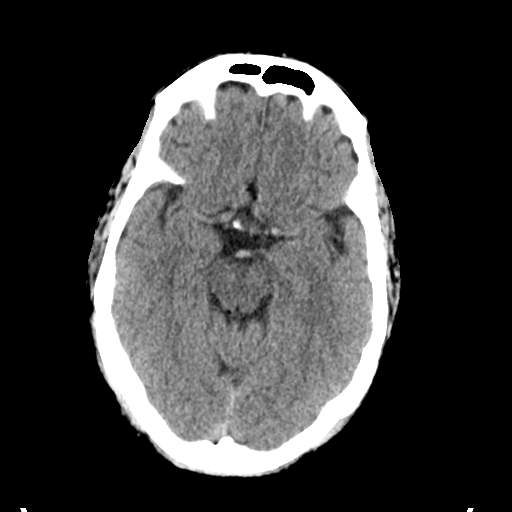
[im 14/34  brain]
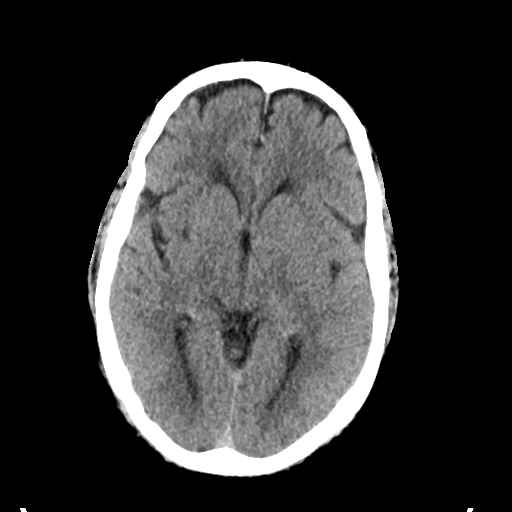
[im 16/34  brain]
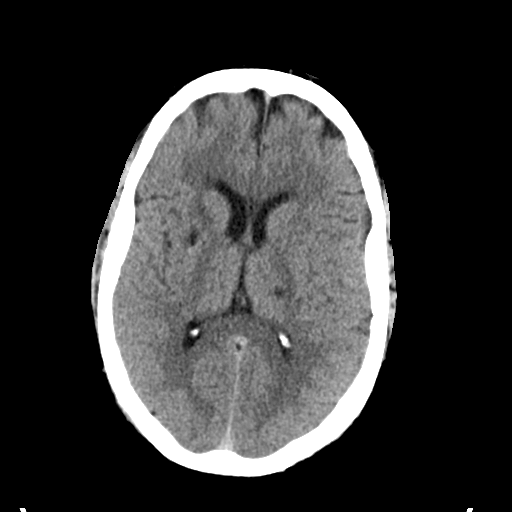
[im 18/34  brain]
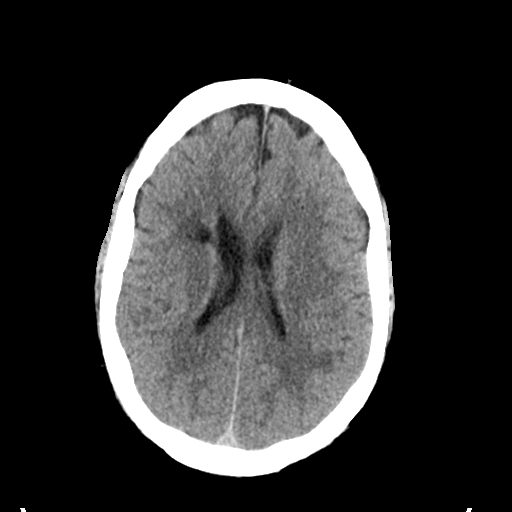
[im 18/34  bone]
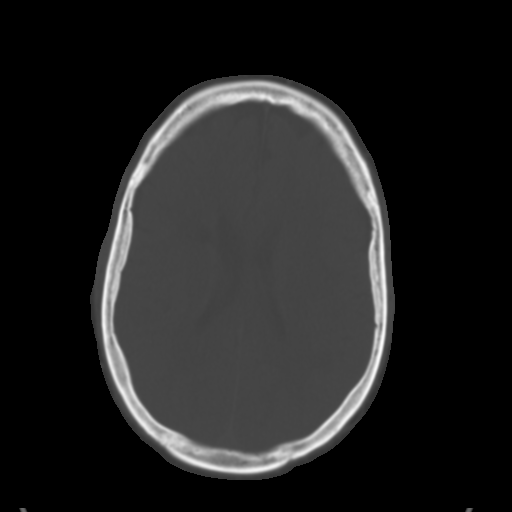
[im 20/34  brain]
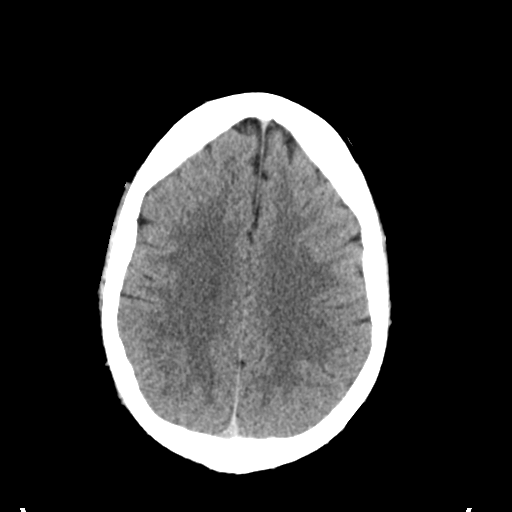
[im 22/34  brain]
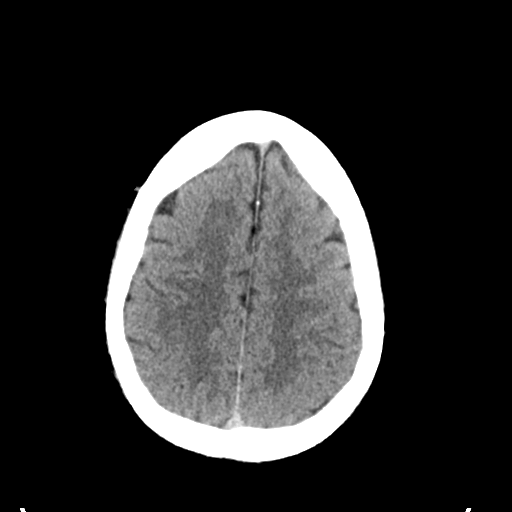
[im 24/34  brain]
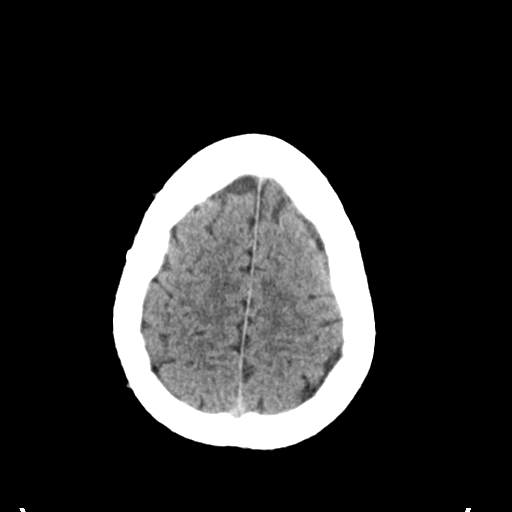
[im 26/34  brain]
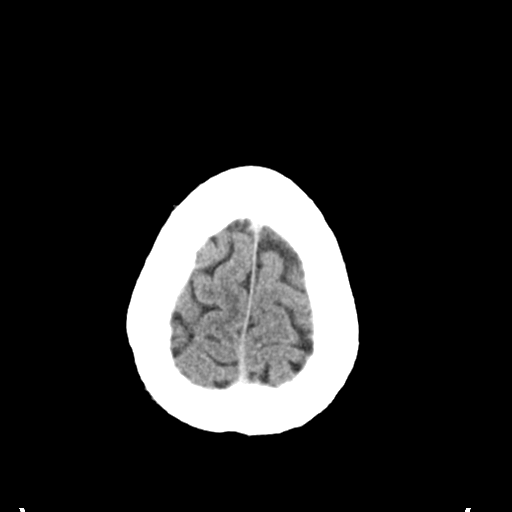
[im 26/34  bone]
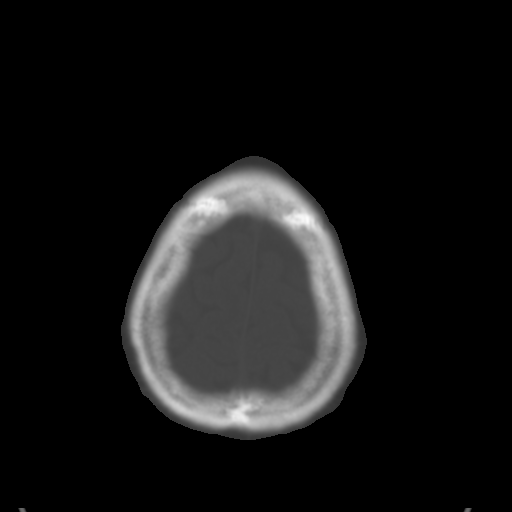
[im 28/34  brain]
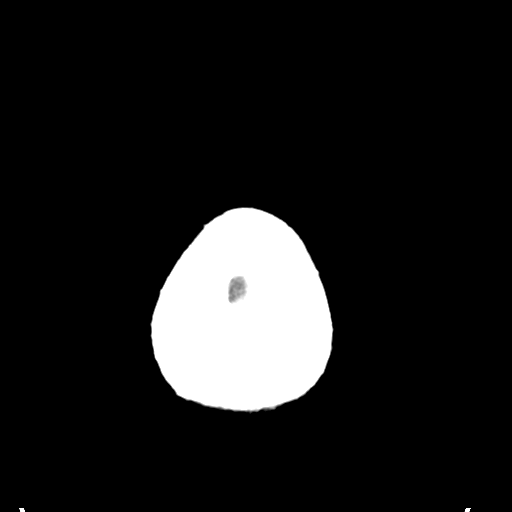
[im 30/34  brain]
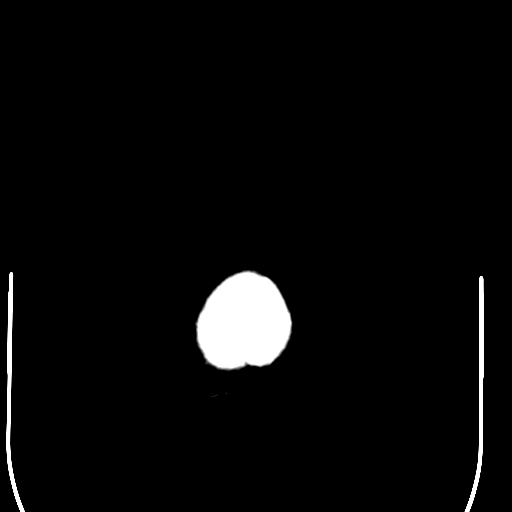
[im 32/34  brain]
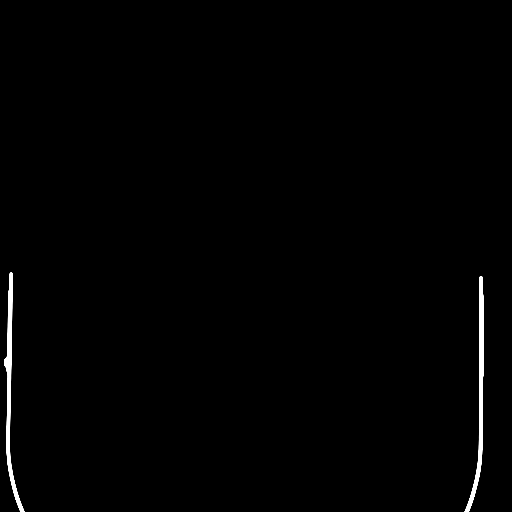

[16 of 30 positions shown; findings below may reference images not displayed]

FINDINGS: Bony calvarium is intact. Paranasal sinuses are well aerated. A
small amount of fluid is noted within the left mastoid air cells
which is stable from the prior exam. Prior lacunar infarct is noted
on the right than the recent of basal ganglia and corona radiata
which is stable from the prior exam. Prior lacunar infarct in the
left thalamus is also noted. No acute hemorrhage, acute infarction
or space-occupying mass lesion is noted. Mild chronic white matter
ischemic change is noted.
IMPRESSION: Chronic changes without acute abnormality.

These results were called by telephone at the time of interpretation
on 07/14/2014 at [DATE] to Dr. Oderf, who verbally acknowledged
these results.

## 8387-05-01 DEATH — deceased
